# Patient Record
Sex: Female | Born: 1945
Health system: Southern US, Community
[De-identification: ages and names within clinical notes are randomized; demographics above are authoritative.]

## PROBLEM LIST (undated history)

## (undated) DIAGNOSIS — F32A Depression, unspecified: Secondary | ICD-10-CM

## (undated) DIAGNOSIS — K219 Gastro-esophageal reflux disease without esophagitis: Secondary | ICD-10-CM

## (undated) DIAGNOSIS — I10 Essential (primary) hypertension: Secondary | ICD-10-CM

## (undated) DIAGNOSIS — E785 Hyperlipidemia, unspecified: Secondary | ICD-10-CM

## (undated) HISTORY — PX: TONSILLECTOMY: SUR1361

## (undated) HISTORY — DX: Essential (primary) hypertension: I10

## (undated) HISTORY — DX: Hyperlipidemia, unspecified: E78.5

## (undated) HISTORY — DX: Depression, unspecified: F32.A

## (undated) HISTORY — DX: Gastro-esophageal reflux disease without esophagitis: K21.9

---

## 2000-07-16 ENCOUNTER — Inpatient Hospital Stay (HOSPITAL_COMMUNITY): Admission: EM | Admit: 2000-07-16 | Discharge: 2000-07-29 | Payer: Self-pay | Admitting: *Deleted

## 2000-07-23 ENCOUNTER — Encounter: Payer: Self-pay | Admitting: *Deleted

## 2000-07-30 ENCOUNTER — Other Ambulatory Visit (HOSPITAL_COMMUNITY): Admission: RE | Admit: 2000-07-30 | Discharge: 2000-08-07 | Payer: Self-pay | Admitting: Psychiatry

## 2003-09-11 ENCOUNTER — Other Ambulatory Visit: Admission: RE | Admit: 2003-09-11 | Discharge: 2003-09-11 | Payer: Self-pay | Admitting: Family Medicine

## 2003-09-15 ENCOUNTER — Ambulatory Visit (HOSPITAL_COMMUNITY): Admission: RE | Admit: 2003-09-15 | Discharge: 2003-09-15 | Payer: Self-pay | Admitting: Family Medicine

## 2004-10-01 ENCOUNTER — Encounter: Admission: RE | Admit: 2004-10-01 | Discharge: 2004-12-30 | Payer: Self-pay | Admitting: Family Medicine

## 2008-09-22 ENCOUNTER — Encounter: Admission: RE | Admit: 2008-09-22 | Discharge: 2008-09-22 | Payer: Self-pay | Admitting: Family Medicine

## 2010-07-19 IMAGING — CR DG CHEST 2V
2 series · 2 of 2 positions shown · non-contrast
Comparison: None

CLINICAL DATA: Cough.  Congestion.  Lethargy.

CHEST - 2 VIEW

[view not recorded (1 of 2)]
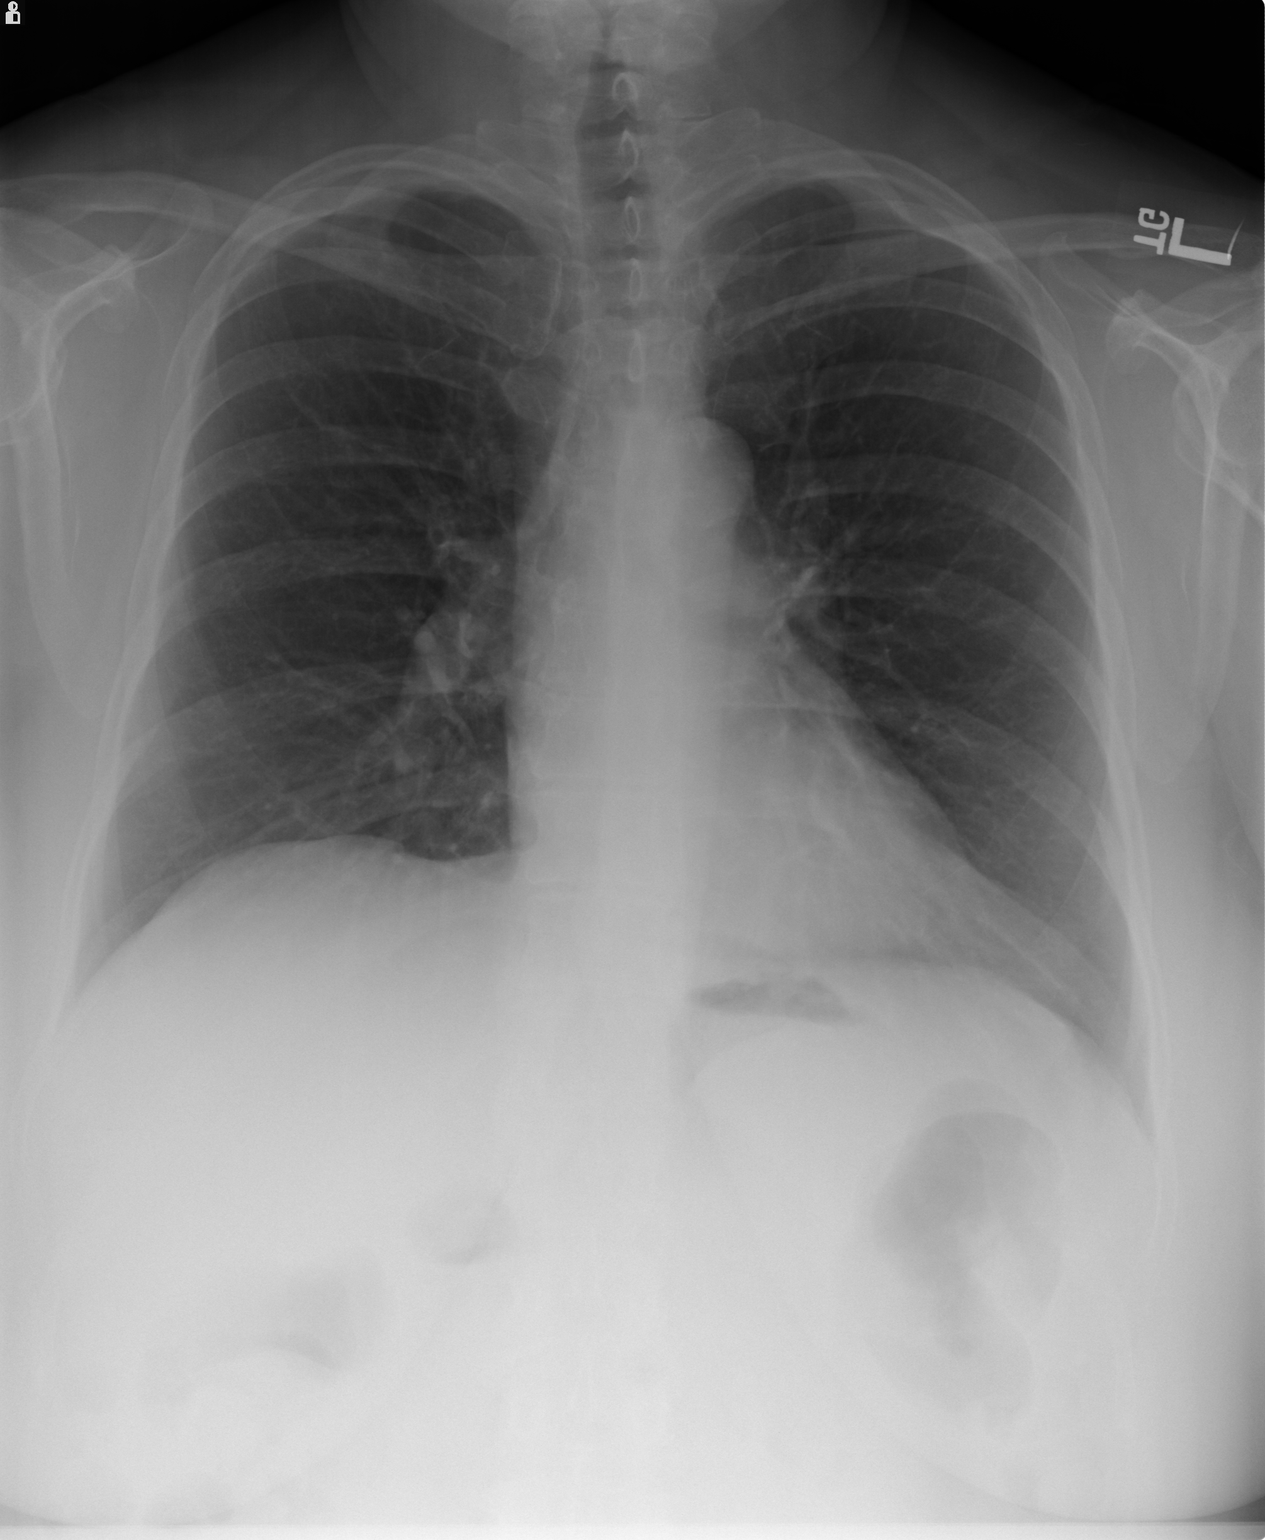

[view not recorded (2 of 2)]
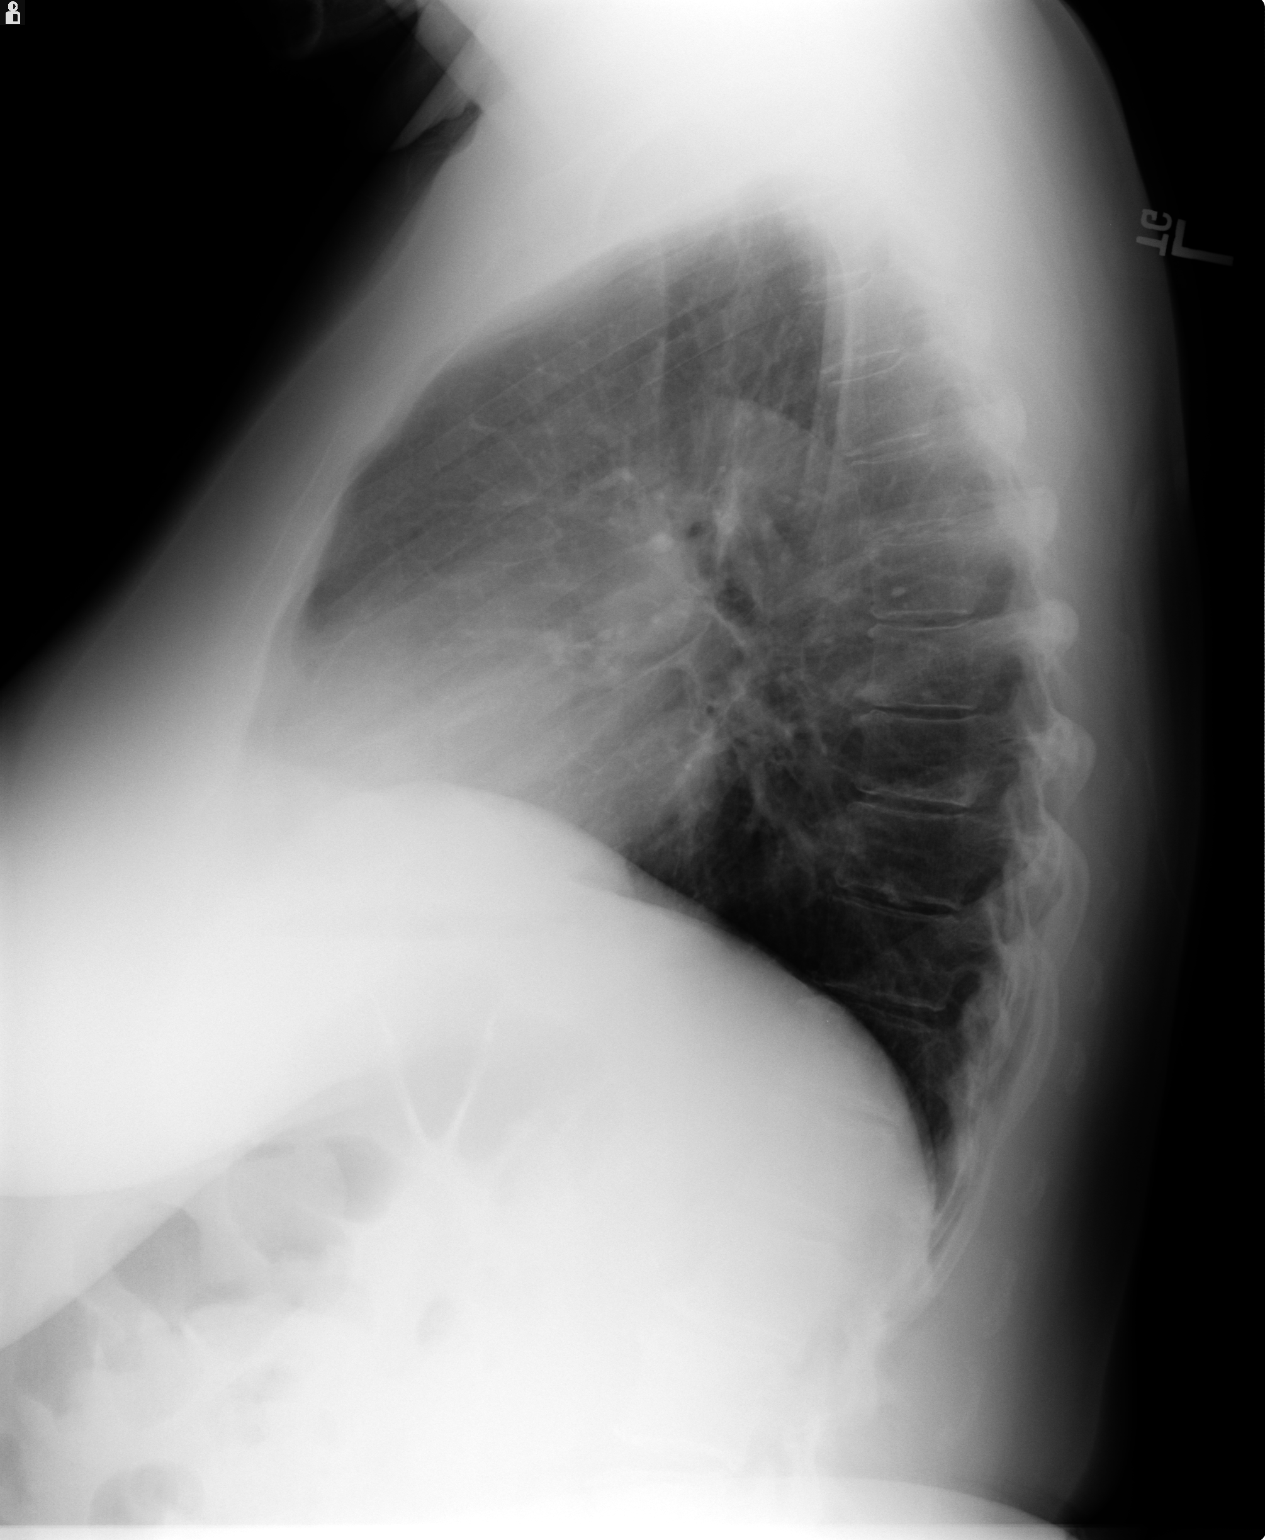

[2 of 2 positions shown; findings below may reference images not displayed]

FINDINGS: Heart size is normal.  The mediastinum is unremarkable.
Lungs are clear.  No definite bronchial thickening.  No infiltrate,
mass, effusion or collapse.  No significant bony finding.
IMPRESSION: No active disease

## 2010-08-09 NOTE — H&P (Signed)
[facility]  Patient:    Jamie Gamble, Jamie Gamble                            MRN: [id number] Adm. Date:  [id number] Attending:  Otilio Saber Dictator:   Young Berry Lorin Picket, R.N., F.N.P.                   Psychiatric Admission Assessment  DATE OF ADMISSION:  July 16, 2000  CHIEF COMPLAINT:  "Now I cant find a job and Im just concerned that Im going to a be a homeless mental patient."  PATIENT IDENTIFICATION:  This is a 65 year old single white female voluntary admission for a long history of depression since age 76 with increase in symptoms of depression since losing her job on April 10, 2000.  HISTORY OF PRESENT ILLNESS:  The patient states she was depressed prior to losing her job in January 2002; however she was able to function on her job. Then she lost her job at [employer] on January 18 due to budget cuts; however, she also states, "There was clique at work and they didnt like me." The patient describes the increasing symptoms of depression since losing he job with decreased sleep for the past three weeks becoming more severe, sleep fluctuating with generally less than six hours a night with initial insomnia and broken sleep.  Decreased appetite for the past month with no evidence of weight change.  Decreased concentration, positive anhedonia.  The patient feels very hopeless, very fearful of the future, and with feelings of great despair, "I cant go on; Im afraid I will be homeless."  The patient admits to feeling worthless and unable to continue.  She is positive for passive suicidal ideation with no plan and no intent.  She denies any auditory or visual hallucinations, denies any feelings of paranoia.  PAST PSYCHIATRIC HISTORY:  The patient was followed in [place] through January by her primary care physician then spent time in [place] with her brother where she was treated for two to three months by Dr. Jeannetta Nap who added Zyprexa to her  previous course of Celexa 40 mg q.d.  The patient then moved to [facility] and saw Dr. Shelly Flatten at [facility] on April 25 who referred her for hospitalization.  The patient reports she was first hospitalized at age 28 at [facility] with several hospitalizations in her 65s including electroconvulsive therapy during her 10s.  She has been previously treated with Prozac which caused anorexia and with Wellbutrin which had no effect.  She had at least one previous suicide attempt by overdose, this last in 1972.  SUBSTANCE ABUSE HISTORY:  The patient denies any abuse of alcohol, illegal drugs.  She is a nonsmoker.  PAST MEDICAL HISTORY:  The patient is followed by Dr. Minerva Areola, a primary care [facility] in [place].  She has had no other primary care since moving from [place].  Medical problems: Untreated hypertension; the patient previously tried some medication for her hypertension; however it made her depressed so she has taken none since.  Medications at the time of seeing Dr. Jodi Marble: Celexa 40 mg q.d., Xanax 0.5 mg p.r.n. dosage unclear; the patient reports she was taking this about two times daily; Zyprexa 10 mg q.h.s.  When seen by Dr. Jodi Marble, he suggesed increasing Celexa to 60 mg q.d., possibly adding Risperdal 0.5 mg q.h.s. and Tranxene 15 mg t.i.d. p.r.n. in his written  notes.  Drug allergies: No known drug allergies.  PE and labs are pending. Pulse 92 on admission, respirations 20, blood pressure 145/98, height 5 feet 5 inches, weight 181 pounds.  SOCIAL HISTORY:  The patient has a Ph.D. in adult education, worked at [employer] until April 10, 2000, when she was dismissed from her job due to [employer].  She previously worked as a Occupational hygienist.  She moved from [place] to [place] to briefly stay with her brother but was unsuccessful in job hunting there, then arrived in [place] on July 15, 2000, to stay with her  father and stepmother.  Seen by Dr. Jodi Marble on April 25 and immediately referred for hospitalization.  At this point, the patients housing plans are uncertain but she states that her family is supportive and said that they would not leave her out on the street or uncared for.  The patient is also terrified that she will not find a job and does not have a job currently. Family history: Mother who has been depressed and with schizophrenia.  Aunt with suicidal ideation and two uncles who committed suicide.  MENTAL STATUS EXAMINATION:  Overweight white female who is polite and cooperative with very flat affect and marked psychomotor retardation.  Speech is slowed in pace and monotone, no pressured speech evident.  Mood is depressed, sad, and anxious.  Thought process is logical, coherent, and goal directed.  She does not display any psychotic symptoms.  No auditory or visual hallucinations are evident, no paranoia evident.  Passive suicidal ideation but no intent or plan, no evidence of homicidal ideation.  She is able to promise safety on the unit.  Cognitive: Intact and oriented x 3.  ADMISSION DIAGNOSES: Axis I:    1. Major depression, recurrent, severe.            2. Generalized anxiety disorder. Axis II:   Deferred. Axis III:  Hypertension. Axis IV:   Moderate problems related to her occupation; she does not currently            have a job; and related to housing; she is currently without a            specific place of residence but does have her father and stepmother            her in this community who are supportive of her. Axis V:    Current 25, past year 56.  INITIAL PLAN OF CARE:  Admit the patient to stabilize her mood.  Q.42m. checks are in place for safety.  We will increase Celexa to 60 mg a day.  She slept well last night on her Restoril 30 mg at h.s. and Zyprexa 10 mg and we will continue those.  We will continue with her Xanax 0.5 mg p.o. b.i.d. as needed for her anxiety.   We will encourage her to participate in groups.  Goal is to lift her depression symptoms and increase her coping skills.  ESTIMATED LENGTH OF STAY:  Three to six days. y DD:  07/17/00 TD:  07/17/00  Job: [id number]

## 2010-08-09 NOTE — Discharge Summary (Signed)
[facility]  Patient:    Jamie Gamble, Jamie Gamble                            MRN: [id number] Adm. Date:  [id number] Disc. Date: 07/29/00 Attending:  Carolanne Gamble D Dictator:   Jamie Gamble, N.P.                           Discharge Summary  HISTORY OF PRESENT ILLNESS:  Jamie Gamble is a 65 year old single Caucasian female admitted on a voluntary basis for a long history of depression since the age of 46 with increasing symptoms of depression since losing her job on April 10, 2000.  She states, "Now I cant find a job and Im just concerned that Im going to be a homeless mental patient."  She had been depressed prior to losing her job in January 2002; however, she was able to function on her job.  She lost her job at [facility] in January due to budget cuts.  She states that people, however, at work did not like her.  Increasing symptoms of depression since losing the job with decreased sleep x 3 weeks, decreased appetite with no evidence of weight change, decreased concentration, anhedonia, feels hopeless, very fearful of the future, and feelings of great despair, "I cant go on; Im afraid I will be homeless."  Admits to feeling worthless and unable to continue, having passive suicidal ideation; however, no plan, no intent, no overt symptoms of psychosis.  PAST MEDICAL HISTORY:  Primary care physician is [facility] in [place].  Medical problems: Untreated hypertension.  Admission medications: Jamie [place] 40 mg q.d., [place] 0.5 mg p.r.n., [place] 10 mg at h.s., ______ .  Drug allergies: No known drug allergies.  She appeared to be in no acute distress. Temperature normal, pulse 92 on admission, respirations 20, blood pressure was elevated at 145/98, height 5 feet 5 inches, weight 101 pounds.  Review of systems was essentially negative.  Lab work: CBC was within normal limits. Thyroid profile: Within normal limits.  CMET: Within normal limits.  Urine drug screen  was positive for benzodiazepines.  Urinalysis: Small amount of esterase.  ADMITTING MENTAL STATUS EXAMINATION:  Overweight Caucasian female who is polite and cooperative with very flat affect and marked psychomotor retardation.  Speech: Slowed in pace and monotone, no pressured speech evident.  Mood is depressed, sad, and anxious.  Thought process: Logical, coherent, and goal directed.  Does not display any psychotic symptoms, no auditory or visual hallucinations, no paranoia, passive suicidal ideation with no intent or plan, no evidence of homicidal ideation, able to promise safety on the unit.  Oriented x 3, cognitive functioning appears to be intact.  ADMITTING DIAGNOSES: Axis I:    1. Major depression, recurrent, severe.            2. Generalized anxiety disorder. Axis II:   Deferred. Axis III:  Hypertension. Axis IV:   Moderate problems related to her occupation, has no job, no home at            present. Axis V:    Current global assessment of functioning 25, highest in the past            year has been 65.  HOSPITAL COURSE:  The patient was admitted to [facility] for treatment of her depression.  Throughout her stay she complained of feeling depressed she continued to  feel depressed and afraid, feeling she had no future, still having suicidal thoughts initially.  She was sleeping well, appetite was fair, energy low.  We did add Jamie Gamble, continued [place] and [place].  We discussed family history of suicide and depression.  At times she would report feeling a little better but she still remained very worried what was going to happen to her, sleeping well, appetite decreased, energy somewhat better, suicidal thoughts decreased.  Blood pressure was elevated; she was placed on [place] and continued Jamie [place]ble, Jamie [place], and [place].  She continued at times just to be anxious, worried, withdrawn.  She denied overt suicidal ideation, sleeping fair, energy was still low,  appetite fair, blood pressure was elevated.  Jamie Gamble was increased and we added Klonopin.  On May 1 she complained of feeling groggy but calmer and a little less depressed.  She denied any suicidal ideation.  Affect was still blunted.  We continued the same medication as well as discussed discharge plans.  She continued just to be depressed and pessimistic but she denied any suicidal ideation.  Affect was blunted.  She was sleeping and eating better.  Blood pressure was under control.  We increased [place] and continued to start working on placement.  On May 3, still depressed, hopeless.  She reported not sleeping well, appetite was poor, energy low, affect still blunted.  We talk to the patient about options including a mood stabilizer and ECT.  The patient did not want lithium or Lamictal.  We increased Jamie Gamble, watched her blood pressure, and looked into having ECT.  On May 4, she was still depressed and hopeless.  She was almost obsessively fixated on the inability to find a job and her future. Sleeping and eating were fair, some restlessness.  Jamie Gamble was increased. Again discussed ECT.  On May 5 she was still obsessing about her inability to work saying she had lost all her skills, crying, hopeless, sleeping better, appetite low, energy low, affect was blunted, tolerating Jamie Gamble.  Again we continued to look into the possibility of ECT.  On May 6 she was a little bit better, affect not as flat, not as hopeless about the future.  Sleep was better, energy a little bit better.  The patient wanted to hold off on ECT n and she denied any suicidal thoughts currently, continued increasing the [facility], continued to see her.  [name] that she maybe felt a little better on May 7, continued to focus on her job and feeling hopeless about finding one. Depression had improved some, no suicidal ideation, no homicidal ideation; does not want ECT, she does not feel she needs it.  Her parents  did find a place for her called [place] [facility] here in [place] and [place] had agreed to admit the patient the next day.  We continued her on [place], Jamie Gam[place],  Klonopin, [place], Jamie [place].  Mood was sad, affect depressed.  We continued to discuss discharge planning.  On May 8 she reported feeling better.  Affect was less blunted and she was not as hopeless or fixated on not finding work.  She denied any suicidal ideation, intent, or plan.  Sleeping and eating fairly well, energy a little better, tolerating [place] [facility]ble well.  We decided to discharge her to [facility] and her parents are very supportive.  The patient was agreeable to this.  CONDITION ON DISCHARGE:  The patient was discharged in improved condition with improvement in her mood, sleep and appetite.  She was eating and sleeping fair,  no suicidal ideation or intent, no homicidal ideation or intent, no signs of psychosis, energy had increased some.  She felt like she could be safe on an outpatient basis.  The patient was discharged to [facility].  DISCHARGE MEDICATIONS: 1. [place] 40 mg two tablets q.a.m. 2. [place] 150 mg SR one tablet at a.m. and at noon. 3. Klonopin 0.5 mg one tablet at a.m. and h.s. 4. [place] 15 mg one tablet at h.s. 5. [place] 50 mg one tablet q.a.m.  DIET:  A 2 g sodium diet due to her hypertension.  FOLLOWUP:  We also asked her to follow up on May 9 at 9 a.m. in the [facility] starting Thursday, May 9.  The patient was agreeable with this.  FINAL DIAGNOSES: Axis I:    1. Major depression, recurrent, severe.            2. Generalized anxiety disorder. Axis II:   Deferred. Axis III:  Hypertension. Axis IV:   Moderate problems related to not finding a job and currently having            no specific place of residence. Axis V:    Current global assessment of functioning 53, highest in the past            year has been 65. DD:  08/10/00 TD:  08/10/00 Job:  [id number]

## 2011-01-03 ENCOUNTER — Ambulatory Visit (INDEPENDENT_AMBULATORY_CARE_PROVIDER_SITE_OTHER): Payer: Self-pay | Admitting: General Surgery

## 2019-07-06 LAB — LIPID PANEL
Cholesterol: 174 (ref 0–200)
HDL: 70 (ref 35–70)
LDL Cholesterol: 78
LDl/HDL Ratio: 2.5
Triglycerides: 162 — AB (ref 40–160)

## 2019-07-06 LAB — BASIC METABOLIC PANEL
BUN: 15 (ref 4–21)
Creatinine: 0.9 (ref <=1.1)
Glucose: 101

## 2019-11-01 ENCOUNTER — Encounter: Payer: Self-pay | Admitting: Internal Medicine

## 2019-11-01 ENCOUNTER — Ambulatory Visit (INDEPENDENT_AMBULATORY_CARE_PROVIDER_SITE_OTHER): Payer: Medicare PPO | Admitting: Internal Medicine

## 2019-11-01 ENCOUNTER — Other Ambulatory Visit: Payer: Self-pay

## 2019-11-01 VITALS — BP 124/80 | HR 77 | Temp 98.1°F | Ht 63.5 in | Wt 186.8 lb

## 2019-11-01 DIAGNOSIS — F32A Depression, unspecified: Secondary | ICD-10-CM | POA: Insufficient documentation

## 2019-11-01 DIAGNOSIS — E785 Hyperlipidemia, unspecified: Secondary | ICD-10-CM

## 2019-11-01 DIAGNOSIS — Z1239 Encounter for other screening for malignant neoplasm of breast: Secondary | ICD-10-CM

## 2019-11-01 DIAGNOSIS — R739 Hyperglycemia, unspecified: Secondary | ICD-10-CM

## 2019-11-01 DIAGNOSIS — I10 Essential (primary) hypertension: Secondary | ICD-10-CM | POA: Diagnosis not present

## 2019-11-01 DIAGNOSIS — F332 Major depressive disorder, recurrent severe without psychotic features: Secondary | ICD-10-CM

## 2019-11-01 DIAGNOSIS — K219 Gastro-esophageal reflux disease without esophagitis: Secondary | ICD-10-CM

## 2019-11-01 LAB — POCT GLUCOSE (DEVICE FOR HOME USE): Glucose Fasting, POC: 97 mg/dL (ref 70–99)

## 2019-11-01 NOTE — Patient Instructions (Signed)
-  Nice seeing you today!!  -See you back in 6 months. 

## 2019-11-01 NOTE — Progress Notes (Signed)
Established Patient Office Visit     This visit occurred during the SARS-CoV-2 public health emergency.  Safety protocols were in place, including screening questions prior to the visit, additional usage of staff PPE, and extensive cleaning of exam room while observing appropriate contact time as indicated for disinfecting solutions.    CC/Reason for Visit: Establish care, discuss chronic medical conditions  HPI: Jamie Gamble is a 74 y.o. female who is coming in today for the above mentioned reasons. Past Medical History is significant for: Hypertension, hyperlipidemia, GERD, depression followed by psychiatry who has received ECT treatments recently.  She has moved from [place].  She was a smoker but quit over 20 years ago.  No alcohol use, she states she has an allergy to Nexium but that allergy was diarrhea, past surgical history significant for tonsillectomy, family history is significant for mental illness in mother and brother with suicide in her brother recently.  She feels she is doing well.  She tells me that her psychiatrist wants her to monitor her blood sugar every 6 months as one of her medications can cause hyperglycemia.  She is on Remeron, quetiapine and trazodone.  She has received both of her Covid vaccines.  She states she had a Cologuard in 2019, is overdue for screening mammogram.   Past Medical/Surgical History: Past Medical History:  Diagnosis Date  . Depression   . GERD (gastroesophageal reflux disease)   . Hyperlipidemia   . Hypertension     History reviewed. No pertinent surgical history.  Social History:  reports that she has quit smoking. She has never used smokeless tobacco. She reports previous alcohol use. She reports that she does not use drugs.  Allergies: Allergies  Allergen Reactions  . Nexium [Esomeprazole] Diarrhea    Family History:  Family History  Problem Relation Age of Onset  . Depression Mother   . Depression Brother      Current  Outpatient Medications:  .  atorvastatin (LIPITOR) 40 MG tablet, , Disp: , Rfl:  .  hydrochlorothiazide (MICROZIDE) 12.5 MG capsule, , Disp: , Rfl:  .  losartan (COZAAR) 25 MG tablet, , Disp: , Rfl:  .  mirtazapine (REMERON) 30 MG tablet, , Disp: , Rfl:  .  OVER THE COUNTER MEDICATION, Vitamin D3 125 mg, Disp: , Rfl:  .  pantoprazole (PROTONIX) 40 MG tablet, , Disp: , Rfl:  .  QUEtiapine (SEROQUEL) 50 MG tablet, , Disp: , Rfl:  .  traZODone (DESYREL) 150 MG tablet, , Disp: , Rfl:   Review of Systems:  Constitutional: Denies fever, chills, diaphoresis, appetite change and fatigue.  HEENT: Denies photophobia, eye pain, redness, hearing loss, ear pain, congestion, sore throat, rhinorrhea, sneezing, mouth sores, trouble swallowing, neck pain, neck stiffness and tinnitus.   Respiratory: Denies SOB, DOE, cough, chest tightness,  and wheezing.   Cardiovascular: Denies chest pain, palpitations and leg swelling.  Gastrointestinal: Denies nausea, vomiting, abdominal pain, diarrhea, constipation, blood in stool and abdominal distention.  Genitourinary: Denies dysuria, urgency, frequency, hematuria, flank pain and difficulty urinating.  Endocrine: Denies: hot or cold intolerance, sweats, changes in hair or nails, polyuria, polydipsia. Musculoskeletal: Denies myalgias, back pain, joint swelling, arthralgias and gait problem.  Skin: Denies pallor, rash and wound.  Neurological: Denies dizziness, seizures, syncope, weakness, light-headedness, numbness and headaches.  Hematological: Denies adenopathy. Easy bruising, personal or family bleeding history  Psychiatric/Behavioral: Denies suicidal ideation, mood changes, confusion, nervousness, sleep disturbance and agitation    Physical Exam: Vitals:   11/01/19  1105  BP: 124/80  Pulse: 77  Temp: 98.1 F (36.7 C)  TempSrc: Oral  SpO2: 96%  Weight: 186 lb 12.8 oz (84.7 kg)  Height: 5' 3.5" (1.613 m)    Body mass index is 32.57  kg/m.   Constitutional: NAD, calm, comfortable Eyes: PERRL, lids and conjunctivae normal ENMT: Mucous membranes are moist. Respiratory: clear to auscultation bilaterally, no wheezing, no crackles. Normal respiratory effort. No accessory muscle use.  Cardiovascular: Regular rate and rhythm, no murmurs / rubs / gallops. No extremity edema. Neurologic: Grossly intact and nonfocal Psychiatric: Normal judgment and insight. Alert and oriented x 3. Normal mood.    Impression and Plan:  Severe episode of recurrent major depressive disorder, without psychotic features (HCC) -Followed closely by psychiatry.  Essential hypertension -Well-controlled on current regimen.  Hyperlipidemia, unspecified hyperlipidemia type -She tells me she had a physical with blood work in April, she will obtain values for me. -For now continue atorvastatin 40 mg.  Gastroesophageal reflux disease without esophagitis -Well-controlled on occasional PPI therapy.    Patient Instructions  -Nice seeing you today!!  -See you back in 6 months.      [name], MD [facility]

## 2019-11-03 ENCOUNTER — Encounter: Payer: Self-pay | Admitting: Internal Medicine

## 2019-11-10 ENCOUNTER — Encounter: Payer: Self-pay | Admitting: Internal Medicine

## 2019-11-24 ENCOUNTER — Ambulatory Visit
Admission: RE | Admit: 2019-11-24 | Discharge: 2019-11-24 | Disposition: A | Discharge status: Home / Self Care | Payer: Medicare PPO | Source: Ambulatory Visit | Attending: Internal Medicine | Admitting: Internal Medicine

## 2019-11-24 ENCOUNTER — Other Ambulatory Visit: Payer: Self-pay

## 2019-11-24 DIAGNOSIS — Z1239 Encounter for other screening for malignant neoplasm of breast: Secondary | ICD-10-CM

## 2019-12-18 ENCOUNTER — Encounter: Payer: Self-pay | Admitting: Internal Medicine

## 2019-12-19 MED ORDER — LOSARTAN POTASSIUM 25 MG PO TABS: 25.0000 mg | ORAL_TABLET | Freq: Every day | ORAL | 0 refills | Status: DC

## 2019-12-19 MED ORDER — PANTOPRAZOLE SODIUM 40 MG PO TBEC: 40.0000 mg | DELAYED_RELEASE_TABLET | Freq: Every day | ORAL | 0 refills | Status: DC

## 2019-12-19 MED ORDER — ATORVASTATIN CALCIUM 40 MG PO TABS: 40.0000 mg | ORAL_TABLET | Freq: Every day | ORAL | 0 refills | Status: DC

## 2019-12-19 MED ORDER — HYDROCHLOROTHIAZIDE 12.5 MG PO CAPS: 12.5000 mg | ORAL_CAPSULE | Freq: Every day | ORAL | 0 refills | Status: DC

## 2019-12-23 ENCOUNTER — Encounter: Payer: Self-pay | Admitting: Internal Medicine

## 2019-12-23 ENCOUNTER — Telehealth: Payer: Self-pay | Admitting: Internal Medicine

## 2019-12-23 NOTE — Telephone Encounter (Signed)
FYI:  Pt called and wanted to know when her lab results are received from the other office.

## 2019-12-27 MED ORDER — LOSARTAN POTASSIUM 25 MG PO TABS
25.0000 mg | ORAL_TABLET | Freq: Every day | ORAL | 1 refills | Status: DC
Start: 2019-12-27 — End: 2020-05-09

## 2019-12-27 MED ORDER — HYDROCHLOROTHIAZIDE 12.5 MG PO CAPS
12.5000 mg | ORAL_CAPSULE | Freq: Every day | ORAL | 1 refills | Status: DC
Start: 2019-12-27 — End: 2020-05-10

## 2019-12-27 MED ORDER — PANTOPRAZOLE SODIUM 40 MG PO TBEC
40.0000 mg | DELAYED_RELEASE_TABLET | Freq: Every day | ORAL | 1 refills | Status: DC
Start: 2019-12-27 — End: 2020-10-18

## 2019-12-27 MED ORDER — ATORVASTATIN CALCIUM 40 MG PO TABS
40.0000 mg | ORAL_TABLET | Freq: Every day | ORAL | 1 refills | Status: DC
Start: 2019-12-27 — End: 2020-05-10

## 2019-12-27 NOTE — Addendum Note (Signed)
Addended by: Kern Reap B on: 12/27/2019 12:06 PM   Modules accepted: Orders

## 2019-12-27 NOTE — Telephone Encounter (Signed)
Form received,  Chart updated,  Refills sent, patient aware by MyChart.

## 2020-01-07 ENCOUNTER — Encounter: Payer: Self-pay | Admitting: Internal Medicine

## 2020-05-03 ENCOUNTER — Ambulatory Visit: Payer: Medicare PPO | Admitting: Internal Medicine

## 2020-05-09 ENCOUNTER — Encounter: Payer: Self-pay | Admitting: Internal Medicine

## 2020-05-10 MED ORDER — HYDROCHLOROTHIAZIDE 12.5 MG PO CAPS: 12.5000 mg | ORAL_CAPSULE | Freq: Every day | ORAL | 1 refills | Status: DC

## 2020-05-10 MED ORDER — LOSARTAN POTASSIUM 25 MG PO TABS
25.0000 mg | ORAL_TABLET | Freq: Every day | ORAL | 1 refills | Status: DC
Start: 2020-05-10 — End: 2020-08-13

## 2020-05-10 MED ORDER — ATORVASTATIN CALCIUM 40 MG PO TABS
40.0000 mg | ORAL_TABLET | Freq: Every day | ORAL | 1 refills | Status: DC
Start: 2020-05-10 — End: 2020-10-11

## 2020-05-14 ENCOUNTER — Encounter: Payer: Self-pay | Admitting: Internal Medicine

## 2020-05-17 ENCOUNTER — Ambulatory Visit: Payer: Medicare PPO | Admitting: Internal Medicine

## 2020-06-01 ENCOUNTER — Encounter: Payer: Self-pay | Admitting: Internal Medicine

## 2020-06-04 ENCOUNTER — Other Ambulatory Visit: Payer: Self-pay

## 2020-06-05 ENCOUNTER — Ambulatory Visit (INDEPENDENT_AMBULATORY_CARE_PROVIDER_SITE_OTHER): Payer: Medicare PPO | Admitting: Internal Medicine

## 2020-06-05 ENCOUNTER — Encounter: Payer: Self-pay | Admitting: Internal Medicine

## 2020-06-05 VITALS — BP 120/80 | HR 88 | Temp 98.3°F | Wt 172.4 lb

## 2020-06-05 DIAGNOSIS — I10 Essential (primary) hypertension: Secondary | ICD-10-CM

## 2020-06-05 DIAGNOSIS — F332 Major depressive disorder, recurrent severe without psychotic features: Secondary | ICD-10-CM | POA: Diagnosis not present

## 2020-06-05 DIAGNOSIS — E785 Hyperlipidemia, unspecified: Secondary | ICD-10-CM | POA: Diagnosis not present

## 2020-06-05 DIAGNOSIS — K219 Gastro-esophageal reflux disease without esophagitis: Secondary | ICD-10-CM | POA: Diagnosis not present

## 2020-06-05 LAB — CBC WITH DIFFERENTIAL/PLATELET
Basophils Absolute: 0.1 10*3/uL (ref 0.0–0.1)
Basophils Relative: 1.4 % (ref 0.0–3.0)
Eosinophils Absolute: 0.4 10*3/uL (ref 0.0–0.7)
Eosinophils Relative: 6.3 % — ABNORMAL HIGH (ref 0.0–5.0)
HCT: 36.3 % (ref 36.0–46.0)
Hemoglobin: 12.2 g/dL (ref 12.0–15.0)
Lymphocytes Relative: 15.9 % (ref 12.0–46.0)
Lymphs Abs: 0.9 10*3/uL (ref 0.7–4.0)
MCHC: 33.7 g/dL (ref 30.0–36.0)
MCV: 89.3 fl (ref 78.0–100.0)
Monocytes Absolute: 0.4 10*3/uL (ref 0.1–1.0)
Monocytes Relative: 6.4 % (ref 3.0–12.0)
Neutro Abs: 4.2 10*3/uL (ref 1.4–7.7)
Neutrophils Relative %: 70 % (ref 43.0–77.0)
Platelets: 312 10*3/uL (ref 150.0–400.0)
RBC: 4.07 Mil/uL (ref 3.87–5.11)
RDW: 15.9 % — ABNORMAL HIGH (ref 11.5–15.5)
WBC: 5.9 10*3/uL (ref 4.0–10.5)

## 2020-06-05 LAB — LIPID PANEL
Cholesterol: 166 mg/dL (ref 0–200)
HDL: 75.8 mg/dL (ref >39.00)
LDL Cholesterol: 75 mg/dL (ref 0–99)
NonHDL: 90.04
Total CHOL/HDL Ratio: 2
Triglycerides: 77 mg/dL (ref 0.0–149.0)
VLDL: 15.4 mg/dL (ref 0.0–40.0)

## 2020-06-05 LAB — VITAMIN D 25 HYDROXY (VIT D DEFICIENCY, FRACTURES): VITD: 43.97 ng/mL (ref 30.00–100.00)

## 2020-06-05 LAB — COMPREHENSIVE METABOLIC PANEL
ALT: 12 U/L (ref 0–35)
AST: 18 U/L (ref 0–37)
Albumin: 3.9 g/dL (ref 3.5–5.2)
Alkaline Phosphatase: 64 U/L (ref 39–117)
BUN: 16 mg/dL (ref 6–23)
CO2: 29 mEq/L (ref 19–32)
Calcium: 9.5 mg/dL (ref 8.4–10.5)
Chloride: 103 mEq/L (ref 96–112)
Creatinine, Ser: 0.76 mg/dL (ref 0.40–1.20)
GFR: 77.73 mL/min (ref >60.00)
Glucose, Bld: 89 mg/dL (ref 70–99)
Potassium: 4.5 mEq/L (ref 3.5–5.1)
Sodium: 140 mEq/L (ref 135–145)
Total Bilirubin: 0.4 mg/dL (ref 0.2–1.2)
Total Protein: 6.5 g/dL (ref 6.0–8.3)

## 2020-06-05 LAB — VITAMIN B12: Vitamin B-12: >1506 pg/mL — ABNORMAL HIGH (ref 211–911)

## 2020-06-05 LAB — TSH: TSH: 1.07 u[IU]/mL (ref 0.35–4.50)

## 2020-06-05 NOTE — Progress Notes (Signed)
Established Patient Office Visit     This visit occurred during the SARS-CoV-2 public health emergency.  Safety protocols were in place, including screening questions prior to the visit, additional usage of staff PPE, and extensive cleaning of exam room while observing appropriate contact time as indicated for disinfecting solutions.    CC/Reason for Visit: Follow-up chronic conditions, requesting labs  HPI: Jamie Gamble is a 75 y.o. female who is coming in today for the above mentioned reasons. Past Medical History is significant for: Hypertension and hyperlipidemia that have been well controlled, GERD and depression followed by psychiatry.  She is due for Pneumovax and Tdap which she will obtain at her pharmacy per her request.  She is fasting today and is requesting labs.  She has no acute complaints and feels well.   Past Medical/Surgical History: Past Medical History:  Diagnosis Date  . Depression   . GERD (gastroesophageal reflux disease)   . Hyperlipidemia   . Hypertension     No past surgical history on file.  Social History:  reports that she has quit smoking. She has never used smokeless tobacco. She reports previous alcohol use. She reports that she does not use drugs.  Allergies: Allergies  Allergen Reactions  . Nexium [Esomeprazole] Diarrhea    Family History:  Family History  Problem Relation Age of Onset  . Depression Mother   . Depression Brother      Current Outpatient Medications:  .  atorvastatin (LIPITOR) 40 MG tablet, Take 1 tablet (40 mg total) by mouth daily., Disp: 90 tablet, Rfl: 1 .  hydrochlorothiazide (MICROZIDE) 12.5 MG capsule, Take 1 capsule (12.5 mg total) by mouth daily., Disp: 90 capsule, Rfl: 1 .  losartan (COZAAR) 25 MG tablet, Take 1 tablet (25 mg total) by mouth daily., Disp: 90 tablet, Rfl: 1 .  OVER THE COUNTER MEDICATION, Vitamin D3 125 mg, Disp: , Rfl:  .  pantoprazole (PROTONIX) 40 MG tablet, Take 1 tablet (40 mg total) by  mouth daily., Disp: 90 tablet, Rfl: 1 .  QUEtiapine (SEROQUEL) 50 MG tablet, Take 25 mg by mouth daily., Disp: , Rfl:  .  traZODone (DESYREL) 150 MG tablet, Take 50 mg by mouth daily., Disp: , Rfl:   Review of Systems:  Constitutional: Denies fever, chills, diaphoresis, appetite change and fatigue.  HEENT: Denies photophobia, eye pain, redness, hearing loss, ear pain, congestion, sore throat, rhinorrhea, sneezing, mouth sores, trouble swallowing, neck pain, neck stiffness and tinnitus.   Respiratory: Denies SOB, DOE, cough, chest tightness,  and wheezing.   Cardiovascular: Denies chest pain, palpitations and leg swelling.  Gastrointestinal: Denies nausea, vomiting, abdominal pain, diarrhea, constipation, blood in stool and abdominal distention.  Genitourinary: Denies dysuria, urgency, frequency, hematuria, flank pain and difficulty urinating.  Endocrine: Denies: hot or cold intolerance, sweats, changes in hair or nails, polyuria, polydipsia. Musculoskeletal: Denies myalgias, back pain, joint swelling, arthralgias and gait problem.  Skin: Denies pallor, rash and wound.  Neurological: Denies dizziness, seizures, syncope, weakness, light-headedness, numbness and headaches.  Hematological: Denies adenopathy. Easy bruising, personal or family bleeding history  Psychiatric/Behavioral: Denies suicidal ideation, mood changes, confusion, nervousness, sleep disturbance and agitation    Physical Exam: Vitals:   06/05/20 1028  BP: 120/80  Pulse: 88  Temp: 98.3 F (36.8 C)  TempSrc: Oral  SpO2: 97%  Weight: 172 lb 6.4 oz (78.2 kg)    Body mass index is 30.06 kg/m.   Constitutional: NAD, calm, comfortable Eyes: PERRL, lids and conjunctivae normal ENMT: Mucous  membranes are moist.  Respiratory: clear to auscultation bilaterally, no wheezing, no crackles. Normal respiratory effort. No accessory muscle use.  Cardiovascular: Regular rate and rhythm, no murmurs / rubs / gallops. No extremity  edema.  Neurologic: Grossly intact and nonfocal Psychiatric: Normal judgment and insight. Alert and oriented x 3. Normal mood.    Impression and Plan:  Severe episode of recurrent major depressive disorder, without psychotic features (HCC)  - Plan: TSH, Vitamin B12, VITAMIN D 25 Hydroxy (Vit-D Deficiency, Fractures) -Mood is stable, followed by psychiatry.  Primary hypertension  - Plan: CBC with Differential/Platelet, Comprehensive metabolic panel -Blood pressure is well controlled on hydrochlorothiazide 12.5 mg daily and losartan 25 mg daily.  Hyperlipidemia, unspecified hyperlipidemia type  - Plan: Lipid panel, -Last LDL was 78 in April 2021.  She is on atorvastatin 40 mg daily.  Gastroesophageal reflux disease without esophagitis -Well-controlled on daily Protonix 40 mg.   Patient Instructions  -Nice seeing you today!!  -Lab work today; will notify you once results are available.  -Schedule follow up in 6 months.     [name], MD [facility]

## 2020-06-05 NOTE — Patient Instructions (Signed)
-  Nice seeing you today!!  -Lab work today; will notify you once results are available.  -Schedule follow up in 6 months. 

## 2020-06-06 ENCOUNTER — Encounter: Payer: Self-pay | Admitting: Internal Medicine

## 2020-06-14 ENCOUNTER — Telehealth: Payer: Self-pay | Admitting: *Deleted

## 2020-06-14 NOTE — Chronic Care Management (AMB) (Signed)
  Chronic Care Management   Note  06/14/2020 Name: Jamie Gamble MRN: [id number] DOB: June 28, 1945  Jamie Gamble is a 75 y.o. year old female who is a primary care patient of Isaac Bliss, Rayford Halsted, MD. I reached out to Wilburt Finlay by phone today in response to a referral sent by [name]'s PCP, Isaac Bliss, Rayford Halsted, MD     [name] was given information about Chronic Care Management services today including:  1. CCM service includes personalized support from designated clinical staff supervised by her physician, including individualized plan of care and coordination with other care providers 2. 24/7 contact phone numbers for assistance for urgent and routine care needs. 3. Service will only be billed when office clinical staff spend 20 minutes or more in a month to coordinate care. 4. Only one practitioner may furnish and bill the service in a calendar month. 5. The patient may stop CCM services at any time (effective at the end of the month) by phone call to the office staff. 6. The patient will be responsible for cost sharing (co-pay) of up to 20% of the service fee (after annual deductible is met).  Patient agreed to services and verbal consent obtained.   Follow up plan: Telephone appointment with care management team member scheduled for:06/18/2020 If patient returns call to provider office, please advise to call [place] at [phone].  [facility]

## 2020-06-18 ENCOUNTER — Ambulatory Visit (INDEPENDENT_AMBULATORY_CARE_PROVIDER_SITE_OTHER): Payer: Medicare PPO

## 2020-06-18 DIAGNOSIS — E785 Hyperlipidemia, unspecified: Secondary | ICD-10-CM | POA: Diagnosis not present

## 2020-06-18 DIAGNOSIS — I10 Essential (primary) hypertension: Secondary | ICD-10-CM

## 2020-06-18 NOTE — Patient Instructions (Signed)
Visit Information Thank you for speaking with me today.  Please be on the look out for the Advanced Directives forms I am sending you and contact me if you have any questions. PATIENT GOALS:  Goals Addressed            This Visit's Progress   . Lifestyle Change-Hypertension       Timeframe:  Long-Range Goal Priority:  Medium Start Date:     06/18/20                        Expected End Date:     09/21/20                  Follow Up Date 07/16/20   - check blood pressure weekly - choose a place to take my blood pressure (home, clinic or office, retail store) - write blood pressure results in a log or diary - learn about high blood pressure  -increase activity as tolerated   Why is this important?    The changes that you are asked to make may be hard to do.   This is especially true when the changes are life-long.   Knowing why it is important to you is the first step.   Working on the change with your family or support person helps you not feel alone.   Reward yourself and family or support person when goals are met. This can be an activity you choose like bowling, hiking, biking, swimming or shooting hoops.     Notes:     Marland Kitchen Manage My Cholesterol       Timeframe:  Long-Range Goal Priority:  Medium Start Date:   06/18/20                          Expected End Date:    09/21/20                   Follow Up Date 07/16/20    - change to whole grain breads, cereal, pasta - eat smaller or less servings of red meat - fill half the plate with nonstarchy vegetables - increase the amount of fiber in food - read food labels for fat and fiber - switch to low-fat or skim milk  -try to increase activity as tolerated Why is this important?   Changing cholesterol starts with eating heart-healthy foods.  Other steps may be to increase your activity and to quit if you smoke.    Notes:       High Cholesterol  High cholesterol is a condition in which the blood has high levels of a  white, waxy substance similar to fat (cholesterol). The liver makes all the cholesterol that the body needs. The human body needs small amounts of cholesterol to help build cells. A person gets extra or excess cholesterol from the food that he or she eats. The blood carries cholesterol from the liver to the rest of the body. If you have high cholesterol, deposits (plaques) may build up on the walls of your arteries. Arteries are the blood vessels that carry blood away from your heart. These plaques make the arteries narrow and stiff. Cholesterol plaques increase your risk for heart attack and stroke. Work with your health care provider to keep your cholesterol levels in a healthy range. What increases the risk? The following factors may make you more likely to develop this condition:  Eating foods that  are high in animal fat (saturated fat) or cholesterol.  Being overweight.  Not getting enough exercise.  A family history of high cholesterol (familial hypercholesterolemia).  Use of tobacco products.  Having diabetes. What are the signs or symptoms? There are no symptoms of this condition. How is this diagnosed? This condition may be diagnosed based on the results of a blood test.  If you are older than 74 years of age, your health care provider may check your cholesterol levels every 4-6 years.  You may be checked more often if you have high cholesterol or other risk factors for heart disease. The blood test for cholesterol measures:  "Bad" cholesterol, or LDL cholesterol. This is the main type of cholesterol that causes heart disease. The desired level is less than 100 mg/dL.  "Good" cholesterol, or HDL cholesterol. HDL helps protect against heart disease by cleaning the arteries and carrying the LDL to the liver for processing. The desired level for HDL is 60 mg/dL or higher.  Triglycerides. These are fats that your body can store or burn for energy. The desired level is less than  150 mg/dL.  Total cholesterol. This measures the total amount of cholesterol in your blood and includes LDL, HDL, and triglycerides. The desired level is less than 200 mg/dL. How is this treated? This condition may be treated with:  Diet changes. You may be asked to eat foods that have more fiber and less saturated fats or added sugar.  Lifestyle changes. These may include regular exercise, maintaining a healthy weight, and quitting use of tobacco products.  Medicines. These are given when diet and lifestyle changes have not worked. You may be prescribed a statin medicine to help lower your cholesterol levels. Follow these instructions at home: Eating and drinking  Eat a healthy, balanced diet. This diet includes: ? Daily servings of a variety of fresh, frozen, or canned fruits and vegetables. ? Daily servings of whole grain foods that are rich in fiber. ? Foods that are low in saturated fats and trans fats. These include poultry and fish without skin, lean cuts of meat, and low-fat dairy products. ? A variety of fish, especially oily fish that contain omega-3 fatty acids. Aim to eat fish at least 2 times a week.  Avoid foods and drinks that have added sugar.  Use healthy cooking methods, such as roasting, grilling, broiling, baking, poaching, steaming, and stir-frying. Do not fry your food except for stir-frying.   Lifestyle  Get regular exercise. Aim to exercise for a total of 150 minutes a week. Increase your activity level by doing activities such as gardening, walking, and taking the stairs.  Do not use any products that contain nicotine or tobacco, such as cigarettes, e-cigarettes, and chewing tobacco. If you need help quitting, ask your health care provider.   General instructions  Take over-the-counter and prescription medicines only as told by your health care provider.  Keep all follow-up visits as told by your health care provider. This is important. Where to find more  information  American Heart Association: www.heart.org  National Heart, Lung, and Blood Institute: https://wilson-eaton.com/ Contact a health care provider if:  You have trouble achieving or maintaining a healthy diet or weight.  You are starting an exercise program.  You are unable to stop smoking. Get help right away if:  You have chest pain.  You have trouble breathing.  You have any symptoms of a stroke. "BE FAST" is an easy way to remember the main warning signs  of a stroke: ? B - Balance. Signs are dizziness, sudden trouble walking, or loss of balance. ? E - Eyes. Signs are trouble seeing or a sudden change in vision. ? F - Face. Signs are sudden weakness or numbness of the face, or the face or eyelid drooping on one side. ? A - Arms. Signs are weakness or numbness in an arm. This happens suddenly and usually on one side of the body. ? S - Speech. Signs are sudden trouble speaking, slurred speech, or trouble understanding what people say. ? T - Time. Time to call emergency services. Write down what time symptoms started.  You have other signs of a stroke, such as: ? A sudden, severe headache with no known cause. ? Nausea or vomiting. ? Seizure. These symptoms may represent a serious problem that is an emergency. Do not wait to see if the symptoms will go away. Get medical help right away. Call your local emergency services (911 in the [place] Do not drive yourself to the hospital. Summary  Cholesterol plaques increase your risk for heart attack and stroke. Work with your health care provider to keep your cholesterol levels in a healthy range.  Eat a healthy, balanced diet, get regular exercise, and maintain a healthy weight.  Do not use any products that contain nicotine or tobacco, such as cigarettes, e-cigarettes, and chewing tobacco.  Get help right away if you have any symptoms of a stroke. This information is not intended to replace advice given to you by your health care  provider. Make sure you discuss any questions you have with your health care provider. Document Revised: 02/07/2019 Document Reviewed: 02/07/2019 Elsevier Patient Education  2021 [place].  Hypertension, Adult Hypertension is another name for high blood pressure. High blood pressure forces your heart to work harder to pump blood. This can cause problems over time. There are two numbers in a blood pressure reading. There is a top number (systolic) over a bottom number (diastolic). It is best to have a blood pressure that is below 120/80. Healthy choices can help lower your blood pressure, or you may need medicine to help lower it. What are the causes? The cause of this condition is not known. Some conditions may be related to high blood pressure. What increases the risk?  Smoking.  Having type 2 diabetes mellitus, high cholesterol, or both.  Not getting enough exercise or physical activity.  Being overweight.  Having too much fat, sugar, calories, or salt (sodium) in your diet.  Drinking too much alcohol.  Having long-term (chronic) kidney disease.  Having a family history of high blood pressure.  Age. Risk increases with age.  Race. You may be at higher risk if you are African American.  Gender. Men are at higher risk than women before age 21. After age 5, women are at higher risk than men.  Having obstructive sleep apnea.  Stress. What are the signs or symptoms?  High blood pressure may not cause symptoms. Very high blood pressure (hypertensive crisis) may cause: ? Headache. ? Feelings of worry or nervousness (anxiety). ? Shortness of breath. ? Nosebleed. ? A feeling of being sick to your stomach (nausea). ? Throwing up (vomiting). ? Changes in how you see. ? Very bad chest pain. ? Seizures. How is this treated?  This condition is treated by making healthy lifestyle changes, such as: ? Eating healthy foods. ? Exercising more. ? Drinking less  alcohol.  Your health care provider may prescribe medicine if lifestyle  changes are not enough to get your blood pressure under control, and if: ? Your top number is above 130. ? Your bottom number is above 80.  Your personal target blood pressure may vary. Follow these instructions at home: Eating and drinking  If told, follow the DASH eating plan. To follow this plan: ? Fill one half of your plate at each meal with fruits and vegetables. ? Fill one fourth of your plate at each meal with whole grains. Whole grains include whole-wheat pasta, brown rice, and whole-grain bread. ? Eat or drink low-fat dairy products, such as skim milk or low-fat yogurt. ? Fill one fourth of your plate at each meal with low-fat (lean) proteins. Low-fat proteins include fish, chicken without skin, eggs, beans, and tofu. ? Avoid fatty meat, cured and processed meat, or chicken with skin. ? Avoid pre-made or processed food.  Eat less than 1,500 mg of salt each day.  Do not drink alcohol if: ? Your doctor tells you not to drink. ? You are pregnant, may be pregnant, or are planning to become pregnant.  If you drink alcohol: ? Limit how much you use to:  0-1 drink a day for women.  0-2 drinks a day for men. ? Be aware of how much alcohol is in your drink. In the [place] one drink equals one 12 oz bottle of beer (355 mL), one 5 oz glass of wine (148 mL), or one 1 oz glass of hard liquor (44 mL).   Lifestyle  Work with your doctor to stay at a healthy weight or to lose weight. Ask your doctor what the best weight is for you.  Get at least 30 minutes of exercise most days of the week. This may include walking, swimming, or biking.  Get at least 30 minutes of exercise that strengthens your muscles (resistance exercise) at least 3 days a week. This may include lifting weights or doing Pilates.  Do not use any products that contain nicotine or tobacco, such as cigarettes, e-cigarettes, and chewing tobacco. If  you need help quitting, ask your doctor.  Check your blood pressure at home as told by your doctor.  Keep all follow-up visits as told by your doctor. This is important.   Medicines  Take over-the-counter and prescription medicines only as told by your doctor. Follow directions carefully.  Do not skip doses of blood pressure medicine. The medicine does not work as well if you skip doses. Skipping doses also puts you at risk for problems.  Ask your doctor about side effects or reactions to medicines that you should watch for. Contact a doctor if you:  Think you are having a reaction to the medicine you are taking.  Have headaches that keep coming back (recurring).  Feel dizzy.  Have swelling in your ankles.  Have trouble with your vision. Get help right away if you:  Get a very bad headache.  Start to feel mixed up (confused).  Feel weak or numb.  Feel faint.  Have very bad pain in your: ? Chest. ? Belly (abdomen).  Throw up more than once.  Have trouble breathing. Summary  Hypertension is another name for high blood pressure.  High blood pressure forces your heart to work harder to pump blood.  For most people, a normal blood pressure is less than 120/80.  Making healthy choices can help lower blood pressure. If your blood pressure does not get lower with healthy choices, you may need to take medicine. This information is  not intended to replace advice given to you by your health care provider. Make sure you discuss any questions you have with your health care provider. Document Revised: 11/18/2017 Document Reviewed: 11/18/2017 Elsevier Patient Education  2021 [place].   Consent to CCM Services: Jamie Gamble was given information about Chronic Care Management services today including:  1. CCM service includes personalized support from designated clinical staff supervised by her physician, including individualized plan of care and coordination with other care  providers 2. 24/7 contact phone numbers for assistance for urgent and routine care needs. 3. Service will only be billed when office clinical staff spend 20 minutes or more in a month to coordinate care. 4. Only one practitioner may furnish and bill the service in a calendar month. 5. The patient may stop CCM services at any time (effective at the end of the month) by phone call to the office staff. 6. The patient will be responsible for cost sharing (co-pay) of up to 20% of the service fee (after annual deductible is met).  Patient agreed to services and verbal consent obtained.   Patient verbalizes understanding of instructions provided today and agrees to view in [place].   Telephone follow up appointment with care management team member scheduled for: 07/16/20 10 AM  Peter Garter RN, [facility], CDE Care Management Coordinator [facility] [phone], Mobile [phone]  CLINICAL CARE PLAN: Patient Care Plan: RNCM:Hypertension (Adult)    Problem Identified: Lack of long term self-management of Hypertension   Priority: Medium    Long-Range Goal: Long term effective self-management of hypertension   Start Date: 06/18/2020  Expected End Date: 09/21/2020  This Visit's Progress: On track  Priority: Medium  Note:   Objective:  . Last practice recorded BP readings:  BP Readings from Last 3 Encounters:  06/05/20 120/80  11/01/19 124/80 .   Marland Kitchen Most recent eGFR/CrCl: No results found for: EGFR  No components found for: CRCL Current Barriers:  Marland Kitchen Knowledge Deficits related to basic understanding of hypertension pathophysiology and self care management . Knowledge Deficits related to understanding of medications prescribed for management of hypertension . Does not adhere to provider recommendations re:  Marland Kitchen Reports she does not regularly check her B/P unless she has a headache or is dizzy, reports eating baked chips at times and has not been watching sodium in  diet Case Manager Clinical Goal(s):  . patient will verbalize understanding of plan for hypertension management . patient will attend all scheduled medical appointments: Dr. Jerilee Hoh 09/05/20 . patient will demonstrate improved health management independence as evidenced by checking blood pressure as directed and notifying PCP if SBP>140 or DBP > 90, taking all medications as prescribe, and adhering to a low sodium diet as discussed. Interventions:  . Collaboration with Isaac Bliss, Rayford Halsted, MD regarding development and update of comprehensive plan of care as evidenced by provider attestation and co-signature . Inter-disciplinary care team collaboration (see longitudinal plan of care) . Evaluation of current treatment plan related to hypertension self management and patient's adherence to plan as established by provider. . Provided education to patient re: stroke prevention, s/s of heart attack and stroke, DASH diet, complications of uncontrolled blood pressure . Discussed plans with patient for ongoing care management follow up and provided patient with direct contact information for care management team . Advised patient, providing education and rationale, to monitor blood pressure 1-2 times a week and record, calling PCP for findings outside established parameters.  . Reviewed scheduled/upcoming provider appointments including: Dr.Hernandez  09/05/20 Self-Care Activities: - Self administers medications as prescribed Attends all scheduled provider appointments Calls provider office for new concerns, questions, or BP outside discussed parameters Checks BP and records as discussed Follows a low sodium diet/DASH diet Patient Goals: - check blood pressure weekly - choose a place to take my blood pressure (home, clinic or office, retail store) - write blood pressure results in a log or diary - learn about high blood pressure -increase activity as tolerated Follow Up Plan: Telephone follow  up appointment with care management team member scheduled for: 07/16/20 at 10 AM      Patient Care Plan: RNCM: Hyperlipidemia    Problem Identified: Lack of effective self management of hyperlipidemia   Priority: Medium    Long-Range Goal: Effective self management of hyperlipidemia   Start Date: 06/18/2020  Expected End Date: 09/21/2020  This Visit's Progress: On track  Priority: Medium  Note:   Current Barriers:  Marland Kitchen Varied  controlled hyperlipidemia, complicated by hx of HTN and depression . Current antihyperlipidemic regimen: Atorvastatin  . Most recent lipid panel:     Component Value Date/Time   CHOL 166 06/05/2020 1050   TRIG 77.0 06/05/2020 1050   HDL 75.80 06/05/2020 1050   CHOLHDL 2 06/05/2020 1050   VLDL 15.4 06/05/2020 1050   LDLCALC 75 06/05/2020 1050 .   Marland Kitchen ASCVD risk enhancing conditions: age >23,  HTN . Does not adhere to provider recommendations re:  RN Care Manager Clinical Goal(s):  . patient will work with Consulting civil engineer, providers, and care team towards execution of optimized self-health management plan . patient will verbalize understanding of plan for hyperlipidemia . patient will meet with RN Care Manager to address educational needs for self management of hyperlipidemia . patient will attend all scheduled medical appointments: Dr. Jerilee Hoh 09/05/20 . patient will verbalize basic understanding of hyperlipidemia disease process and self health management plan as evidenced by adherence to medications,diet, exercise and follow up with provider . the patient will work with the care management team towards completion of advanced directives Interventions: . Collaboration with Isaac Bliss, Rayford Halsted, MD regarding development and update of comprehensive plan of care as evidenced by provider attestation and co-signature . Inter-disciplinary care team collaboration (see longitudinal plan of care) . Medication review performed; medication list updated in electronic  medical record.  . Evaluation of current treatment plan related to hyperlipidemia and patient's adherence to plan as established by provider. . Provided education to patient and/or caregiver about advanced directives . Provided education to patient re: hyperlipidemia  . Reviewed medications with patient and discussed statin use . Provided patient with written educational materials related to hyperlipidemia . Reviewed scheduled/upcoming provider appointments including:  . Discussed plans with patient for ongoing care management follow up and provided patient with direct contact information for care management team . Instructed on importance of regular exercise for lipid management . Mailed patient Advanced Directives  packet Patient Goals/Self-Care Activities: - call for medicine refill 2 or 3 days before it runs out - change to whole grain breads, cereal, pasta - drink 6 to 8 glasses of water each day - eat 3 to 5 servings of fruits and vegetables each day - fill half the plate with nonstarchy vegetables - read food labels for fat, fiber, carbohydrates and portion size - eat smaller or less servings of red meat - fill half the plate with nonstarchy vegetables - increase the amount of fiber in food - read food labels for fat and fiber -  switch to low-fat or skim milk -increase activity as tolerated Follow Up Plan: Telephone follow up appointment with care management team member scheduled for: 07/16/20 at 10 AM

## 2020-06-18 NOTE — Chronic Care Management (AMB) (Signed)
Chronic Care Management   CCM RN Visit Note  06/18/2020 Name: Jamie Gamble MRN: [id number] DOB: 1945/11/02  Subjective: Jamie Gamble is a 75 y.o. year old female who is a primary care patient of Isaac Bliss, Rayford Halsted, MD. The care management team was consulted for assistance with disease management and care coordination needs.    Engaged with patient by telephone for initial visit in response to provider referral for case management and/or care coordination services.   Consent to Services:  The patient was given the following information about Chronic Care Management services today, agreed to services, and gave verbal consent: 1. CCM service includes personalized support from designated clinical staff supervised by the primary care provider, including individualized plan of care and coordination with other care providers 2. 24/7 contact phone numbers for assistance for urgent and routine care needs. 3. Service will only be billed when office clinical staff spend 20 minutes or more in a month to coordinate care. 4. Only one practitioner may furnish and bill the service in a calendar month. 5.The patient may stop CCM services at any time (effective at the end of the month) by phone call to the office staff. 6. The patient will be responsible for cost sharing (co-pay) of up to 20% of the service fee (after annual deductible is met). Patient agreed to services and consent obtained.  Patient agreed to services and verbal consent obtained.   Assessment: Review of patient past medical history, allergies, medications, health status, including review of consultants reports, laboratory and other test data, was performed as part of comprehensive evaluation and provision of chronic care management services.   SDOH (Social Determinants of Health) assessments and interventions performed:  SDOH Interventions   Flowsheet Row Most Recent Value  SDOH Interventions   Food Insecurity Interventions Intervention Not  Indicated  Housing Interventions Intervention Not Indicated  Physical Activity Interventions Other (Comments)  [reviewed importance of regular exercise]  Transportation Interventions Intervention Not Indicated  [uses [employer] or friends]       CCM Care Plan  Allergies  Allergen Reactions  . Nexium [Esomeprazole] Diarrhea    Outpatient Encounter Medications as of 06/18/2020  Medication Sig  . atorvastatin (LIPITOR) 40 MG tablet Take 1 tablet (40 mg total) by mouth daily.  . hydrochlorothiazide (MICROZIDE) 12.5 MG capsule Take 1 capsule (12.5 mg total) by mouth daily.  Marland Kitchen losartan (COZAAR) 25 MG tablet Take 1 tablet (25 mg total) by mouth daily.  Marland Kitchen OVER THE COUNTER MEDICATION Vitamin D3 125 mg  . QUEtiapine (SEROQUEL) 50 MG tablet Take 25 mg by mouth daily.  . traZODone (DESYREL) 150 MG tablet Take 50 mg by mouth daily.  . pantoprazole (PROTONIX) 40 MG tablet Take 1 tablet (40 mg total) by mouth daily. (Patient not taking: Reported on 06/18/2020)   No facility-administered encounter medications on file as of 06/18/2020.    Patient Active Problem List   Diagnosis Date Noted  . Depression   . Hypertension   . Hyperlipidemia   . GERD (gastroesophageal reflux disease)     Conditions to be addressed/monitored:HTN, HLD and Depression  Care Plan : RNCM:Hypertension (Adult)  Updates made by Dimitri Ped, RN since 06/18/2020 12:00 AM    Problem: Lack of long term self-management of Hypertension   Priority: Medium    Long-Range Goal: Long term effective self-management of hypertension   Start Date: 06/18/2020  Expected End Date: 09/21/2020  This Visit's Progress: On track  Priority: Medium  Note:   Objective:  .  Last practice recorded BP readings:  BP Readings from Last 3 Encounters:  06/05/20 120/80  11/01/19 124/80 .   Marland Kitchen Most recent eGFR/CrCl: No results found for: EGFR  No components found for: CRCL Current Barriers:  Marland Kitchen Knowledge Deficits related to basic understanding of  hypertension pathophysiology and self care management . Knowledge Deficits related to understanding of medications prescribed for management of hypertension . Does not adhere to provider recommendations re:  Marland Kitchen Reports she does not regularly check her B/P unless she has a headache or is dizzy, reports eating baked chips at times and has not been watching sodium in diet Case Manager Clinical Goal(s):  . patient will verbalize understanding of plan for hypertension management . patient will attend all scheduled medical appointments: Dr. Jerilee Hoh 09/05/20 . patient will demonstrate improved health management independence as evidenced by checking blood pressure as directed and notifying PCP if SBP>140 or DBP > 90, taking all medications as prescribe, and adhering to a low sodium diet as discussed. Interventions:  . Collaboration with Isaac Bliss, Rayford Halsted, MD regarding development and update of comprehensive plan of care as evidenced by provider attestation and co-signature . Inter-disciplinary care team collaboration (see longitudinal plan of care) . Evaluation of current treatment plan related to hypertension self management and patient's adherence to plan as established by provider. . Provided education to patient re: stroke prevention, s/s of heart attack and stroke, DASH diet, complications of uncontrolled blood pressure . Discussed plans with patient for ongoing care management follow up and provided patient with direct contact information for care management team . Advised patient, providing education and rationale, to monitor blood pressure 1-2 times a week and record, calling PCP for findings outside established parameters.  . Reviewed scheduled/upcoming provider appointments including: Dr.Hernandez 09/05/20 Self-Care Activities: - Self administers medications as prescribed Attends all scheduled provider appointments Calls provider office for new concerns, questions, or BP outside  discussed parameters Checks BP and records as discussed Follows a low sodium diet/DASH diet Patient Goals: - check blood pressure weekly - choose a place to take my blood pressure (home, clinic or office, retail store) - write blood pressure results in a log or diary - learn about high blood pressure -increase activity as tolerated Follow Up Plan: Telephone follow up appointment with care management team member scheduled for: 07/16/20 at 10 AM   Care Plan : RNCM: Hyperlipidemia  Updates made by Dimitri Ped, RN since 06/18/2020 12:00 AM    Problem: Lack of effective self management of hyperlipidemia   Priority: Medium    Long-Range Goal: Effective self management of hyperlipidemia   Start Date: 06/18/2020  Expected End Date: 09/21/2020  This Visit's Progress: On track  Priority: Medium  Note:   Current Barriers:  Marland Kitchen Varied  controlled hyperlipidemia, complicated by hx of HTN and depression . Current antihyperlipidemic regimen: Atorvastatin  . Most recent lipid panel:     Component Value Date/Time   CHOL 166 06/05/2020 1050   TRIG 77.0 06/05/2020 1050   HDL 75.80 06/05/2020 1050   CHOLHDL 2 06/05/2020 1050   VLDL 15.4 06/05/2020 1050   LDLCALC 75 06/05/2020 1050 .   Marland Kitchen ASCVD risk enhancing conditions: age >63,  HTN . Does not adhere to provider recommendations re:  RN Care Manager Clinical Goal(s):  . patient will work with Consulting civil engineer, providers, and care team towards execution of optimized self-health management plan . patient will verbalize understanding of plan for hyperlipidemia . patient will meet with RN Care  Manager to address educational needs for self management of hyperlipidemia . patient will attend all scheduled medical appointments: Dr. Jerilee Hoh 09/05/20 . patient will verbalize basic understanding of hyperlipidemia disease process and self health management plan as evidenced by adherence to medications,diet, exercise and follow up with provider . the  patient will work with the care management team towards completion of advanced directives Interventions: . Collaboration with Isaac Bliss, Rayford Halsted, MD regarding development and update of comprehensive plan of care as evidenced by provider attestation and co-signature . Inter-disciplinary care team collaboration (see longitudinal plan of care) . Medication review performed; medication list updated in electronic medical record.  . Evaluation of current treatment plan related to hyperlipidemia and patient's adherence to plan as established by provider. . Provided education to patient and/or caregiver about advanced directives . Provided education to patient re: hyperlipidemia  . Reviewed medications with patient and discussed statin use . Provided patient with written educational materials related to hyperlipidemia . Reviewed scheduled/upcoming provider appointments including:  . Discussed plans with patient for ongoing care management follow up and provided patient with direct contact information for care management team . Instructed on importance of regular exercise for lipid management . Mailed patient Advanced Directives  packet Patient Goals/Self-Care Activities: - call for medicine refill 2 or 3 days before it runs out - change to whole grain breads, cereal, pasta - drink 6 to 8 glasses of water each day - eat 3 to 5 servings of fruits and vegetables each day - fill half the plate with nonstarchy vegetables - read food labels for fat, fiber, carbohydrates and portion size - eat smaller or less servings of red meat - fill half the plate with nonstarchy vegetables - increase the amount of fiber in food - read food labels for fat and fiber - switch to low-fat or skim milk -increase activity as tolerated Follow Up Plan: Telephone follow up appointment with care management team member scheduled for: 07/16/20 at 10 AM       Plan:Telephone follow up appointment with care management  team member scheduled for:  07/16/20 Peter Garter RN, Jackquline Denmark, CDE Care Management Coordinator [place] [phone], [place] [phone]

## 2020-06-20 ENCOUNTER — Encounter: Payer: Self-pay | Admitting: Internal Medicine

## 2020-06-22 ENCOUNTER — Encounter: Payer: Self-pay | Admitting: Internal Medicine

## 2020-07-16 ENCOUNTER — Ambulatory Visit (INDEPENDENT_AMBULATORY_CARE_PROVIDER_SITE_OTHER): Payer: Medicare PPO

## 2020-07-16 DIAGNOSIS — E785 Hyperlipidemia, unspecified: Secondary | ICD-10-CM | POA: Diagnosis not present

## 2020-07-16 DIAGNOSIS — I10 Essential (primary) hypertension: Secondary | ICD-10-CM | POA: Diagnosis not present

## 2020-07-16 NOTE — Patient Instructions (Signed)
Visit Information  PATIENT GOALS: Goals Addressed            This Visit's Progress   . Lifestyle Change-Hypertension   On track    Timeframe:  Long-Range Goal Priority:  Medium Start Date:     06/18/20                        Expected End Date:     09/21/20                  Follow Up Date 09/10/20   - check blood pressure weekly - choose a place to take my blood pressure (home, clinic or office, retail store) - write blood pressure results in a log or diary - learn about high blood pressure  -increase activity as tolerated   Why is this important?    The changes that you are asked to make may be hard to do.   This is especially true when the changes are life-long.   Knowing why it is important to you is the first step.   Working on the change with your family or support person helps you not feel alone.   Reward yourself and family or support person when goals are met. This can be an activity you choose like bowling, hiking, biking, swimming or shooting hoops.     Notes:     Marland Kitchen Manage My Cholesterol   On track    Timeframe:  Long-Range Goal Priority:  Medium Start Date:   06/18/20                          Expected End Date:    09/21/20                   Follow Up Date 09/10/20    - change to whole grain breads, cereal, pasta - eat smaller or less servings of red meat - fill half the plate with nonstarchy vegetables - increase the amount of fiber in food - read food labels for fat and fiber - switch to low-fat or skim milk  -try to increase activity as tolerated Why is this important?   Changing cholesterol starts with eating heart-healthy foods.  Other steps may be to increase your activity and to quit if you smoke.    Notes:       Exercise Information for Aging Adults Staying physically active is important as you age. The four types of exercises that are best for older adults are endurance, strength, balance, and flexibility. Contact your health care provider  before you start any exercise routine. Ask your health care provider what activities are safe for you. What are the risks? Risks associated with exercising include:  Overdoing it. This may lead to sore muscles or fatigue.  Falls.  Injuries.  Dehydration. How to do these exercises Endurance exercises Endurance (aerobic) exercises raise your breathing rate and heart rate. Increasing your endurance helps you to do everyday tasks and stay healthy. By improving the health of your body system that includes your heart, lungs, and blood vessels (circulatory system), you may also delay or prevent diseases such as heart disease, diabetes, and bone loss (osteoporosis). Types of endurance exercises include:  Sports.  Indoor activities, such as using gym equipment, doing water aerobics, or dancing.  Outdoor activities, such as biking or jogging.  Tasks around the house, such as gardening, yard work, and heavy household chores like  cleaning.  Walking, such as hiking or walking around your neighborhood. When doing endurance exercises, make sure you:  Are aware of your surroundings.  Use safety equipment as directed.  Dress in layers when exercising outdoors.  Drink plenty of water to stay well hydrated. Build up endurance slowly. Start with 10 minutes at a time, and gradually build up to doing 30 minutes at a time. Unless your health care provider gave you different instructions, aim to exercise for a total of 150 minutes a week. Spread out that time so you are working on endurance on 3 or more days a week.   Strength exercises Lifting, pulling, or pushing weights helps to strengthen muscles. Having stronger muscles makes it easier to do everyday activities, such as getting up from a chair, climbing stairs, carrying groceries, and playing with grandchildren. Strength exercises include arm and leg exercises that may be done:  With weights.  Without weights (using your own body weight).  With  a resistance band. When doing strength exercises:  Move smoothly and steadily. Do not suddenly thrust or jerk the weights, the resistance band, or your body.  Start with no weights or with light weights, and gradually add more weight over time. Eventually, aim to use weights that are hard or very hard for you to lift. This means that you are able to do 8 repetitions with the weight, and the last few repetitions are very challenging.  Lift or push weights into position for 3 seconds, hold the position for 1 second, and then take 3 seconds to return to your starting position.  Breathe out (exhale) during difficult movements, like lifting or pushing weights. Breathe in (inhale) to relax your muscles before the next repetition.  Consider alternating arms or legs, especially when you first start strength exercises.  Expect some slight muscle soreness after each session. Do strength exercises on 2 or more days a week, for 30 minutes at a time. Avoid exercising the same muscle groups two days in a row. For example, if you work on your leg muscles one day, work on your arm muscles the next day. When you can do two sets of 10-15 repetitions with a certain weight, increase the amount of weight.   Balance Balance exercises can help to prevent falls. Balance exercises include:  Standing on one foot.  Heel-to-toe walk.  Balance walk.  Tai chi. Make sure you have something sturdy to hold onto while doing balance exercises, such as a sturdy chair. As your balance improves, challenge yourself by holding onto the chair with one hand instead of two, and then with no hands. Trying exercises with your eyes closed also challenges your balance, but be sure to have a sturdy surface (like a countertop) close by in case you need it. Do balance exercises as often as you want, or as often as directed by your health care provider. Strength exercises for the lower body also help to improve  balance. Flexibility Flexibility exercises improve how far you can bend, straighten, move, or rotate parts of your body (range of motion). These exercises also help you to do everyday activities such as getting dressed or reaching for objects. Flexibility exercises include stretching different parts of the body, and they may be done in a standing or seated position or on the floor. When stretching, make sure you:  Keep a slight bend in your arms and legs. Avoid completely straightening ("locking") your joints.  Do not stretch so far that you feel pain. You  should feel a mild stretching feeling. You may try stretching farther as you become more flexible over time.  Relax and breathe between stretches.  Hold onto something sturdy for balance as needed. Hold each stretch for 10-30 seconds. Repeat each stretch 3-5 times.   General safety tips  Exercise in well-lit areas.  Do not hold your breath during exercises or stretches.  Warm up before exercising, and cool down after exercising. This can help prevent injury.  Drink plenty of water during exercise or any activity that makes you sweat.  Use smooth, steady movements. Do not use sudden, jerking movements, especially when lifting weights or doing flexibility exercises.  If you are not sure if an exercise is safe for you, or you are not sure how to do an exercise, talk with your health care provider. This is especially important if you have had surgery on muscles, bones, or joints (orthopedic surgery). Where to find more information You can find more information about exercise for older adults from:  Your local health department, fitness center, or community center. These facilities may have programs for aging adults.  [employer] on Aging: http://kim-miller.com/  National Council on Aging: www.ncoa.org Summary  Staying physically active is important as you age.  Make sure to contact your health care provider before you start any  exercise routine. Ask your health care provider what activities are safe for you.  Doing endurance, strength, balance, and flexibility exercises can help to delay or prevent certain diseases, such as heart disease, diabetes, and bone loss (osteoporosis). This information is not intended to replace advice given to you by your health care provider. Make sure you discuss any questions you have with your health care provider. Document Revised: 06/29/2019 Document Reviewed: 06/29/2019 Elsevier Patient Education  2021 [place].   Patient verbalizes understanding of instructions provided today and agrees to view in [place].   Telephone follow up appointment with care management team member scheduled for: 09/10/2020  Peter Garter RN, [facility], CDE Care Management Coordinator [place] [phone], [place] [phone]

## 2020-07-16 NOTE — Chronic Care Management (AMB) (Signed)
Chronic Care Management   CCM RN Visit Note  07/16/2020 Name: Jamie Gamble MRN: [id number] DOB: 1945/11/12  Subjective: Jamie Gamble is a 75 y.o. year old female who is a primary care patient of Isaac Bliss, Rayford Halsted, MD. The care management team was consulted for assistance with disease management and care coordination needs.    Engaged with patient by telephone for follow up visit in response to provider referral for case management and/or care coordination services.   Consent to Services:  The patient was given information about Chronic Care Management services, agreed to services, and gave verbal consent prior to initiation of services.  Please see initial visit note for detailed documentation.   Patient agreed to services and verbal consent obtained.   Assessment: Review of patient past medical history, allergies, medications, health status, including review of consultants reports, laboratory and other test data, was performed as part of comprehensive evaluation and provision of chronic care management services.   SDOH (Social Determinants of Health) assessments and interventions performed:    CCM Care Plan  Allergies  Allergen Reactions  . Nexium [Esomeprazole] Diarrhea    Outpatient Encounter Medications as of 07/16/2020  Medication Sig  . atorvastatin (LIPITOR) 40 MG tablet Take 1 tablet (40 mg total) by mouth daily.  . hydrochlorothiazide (MICROZIDE) 12.5 MG capsule Take 1 capsule (12.5 mg total) by mouth daily.  Marland Kitchen losartan (COZAAR) 25 MG tablet Take 1 tablet (25 mg total) by mouth daily.  Marland Kitchen OVER THE COUNTER MEDICATION Vitamin D3 125 mg  . pantoprazole (PROTONIX) 40 MG tablet Take 1 tablet (40 mg total) by mouth daily. (Patient not taking: Reported on 06/18/2020)  . QUEtiapine (SEROQUEL) 50 MG tablet Take 25 mg by mouth daily.  . traZODone (DESYREL) 150 MG tablet Take 50 mg by mouth daily.   No facility-administered encounter medications on file as of 07/16/2020.     Patient Active Problem List   Diagnosis Date Noted  . Depression   . Hypertension   . Hyperlipidemia   . GERD (gastroesophageal reflux disease)     Conditions to be addressed/monitored:HTN and HLD  Care Plan : RNCM:Hypertension (Adult)  Updates made by Jamie Ped, RN since 07/16/2020 12:00 AM    Problem: Lack of long term self-management of Hypertension   Priority: Medium    Long-Range Goal: Long term effective self-management of hypertension   Start Date: 06/18/2020  Expected End Date: 09/21/2020  This Visit's Progress: On track  Recent Progress: On track  Priority: Medium  Note:    Objective:  . Last practice recorded BP readings:  BP Readings from Last 3 Encounters:  06/05/20 120/80  11/01/19 124/80 .   Marland Kitchen Most recent eGFR/CrCl: No results found for: EGFR  No components found for: CRCL Current Barriers:  Marland Kitchen Knowledge Deficits related to basic understanding of hypertension pathophysiology and self care management . Knowledge Deficits related to understanding of medications prescribed for management of hypertension . Does not adhere to provider recommendations re:  Marland Kitchen Reports she has not started checking her B/P yet.  Reports she is trying to eat healthy and less salt.  States she is walking every few days Case Manager Clinical Goal(s):  . patient will verbalize understanding of plan for hypertension management . patient will attend all scheduled medical appointments: Dr. Jerilee Hoh 12/06/20 . patient will demonstrate improved health management independence as evidenced by checking blood pressure as directed and notifying PCP if SBP>140 or DBP > 90, taking all medications as prescribe, and adhering to  a low sodium diet as discussed. Interventions:  . Collaboration with Isaac Bliss, Rayford Halsted, MD regarding development and update of comprehensive plan of care as evidenced by provider attestation and co-signature . Inter-disciplinary care team collaboration (see  longitudinal plan of care) . Evaluation of current treatment plan related to hypertension self management and patient's adherence to plan as established by provider. . Reinforced education to patient re: stroke prevention, s/s of heart attack and stroke, DASH diet, complications of uncontrolled blood pressure . Reviewed plans with patient for ongoing care management follow up and provided patient with direct contact information for care management team . Reinforced to monitor blood pressure 1-2 times a week and record, calling PCP for findings outside established parameters.  . Reviewed scheduled/upcoming provider appointments including: Dr.Hernandez 12/06/20 . Instructed on importance of regular exercise to help keep B/P in control and encouraged to try doing some type of strength/resistance training twice a week Self-Care Activities: - Self administers medications as prescribed Attends all scheduled provider appointments Calls provider office for new concerns, questions, or BP outside discussed parameters Checks BP and records as discussed Follows a low sodium diet/DASH diet Patient Goals: - check blood pressure weekly - choose a place to take my blood pressure (home, clinic or office, retail store) - write blood pressure results in a log or diary - learn about high blood pressure -increase activity as tolerated Follow Up Plan: Telephone follow up appointment with care management team member scheduled for: 09/10/20 at 10 AM   Care Plan : RNCM: Hyperlipidemia  Updates made by Jamie Ped, RN since 07/16/2020 12:00 AM    Problem: Lack of effective self management of hyperlipidemia   Priority: Medium    Long-Range Goal: Effective self management of hyperlipidemia   Start Date: 06/18/2020  Expected End Date: 09/21/2020  This Visit's Progress: On track  Recent Progress: On track  Priority: Medium  Note:   Current Barriers:  Marland Kitchen Varied  controlled hyperlipidemia, complicated by hx of  HTN and depression . Current antihyperlipidemic regimen: Atorvastatin  . Most recent lipid panel:     Component Value Date/Time   CHOL 166 06/05/2020 1050   TRIG 77.0 06/05/2020 1050   HDL 75.80 06/05/2020 1050   CHOLHDL 2 06/05/2020 1050   VLDL 15.4 06/05/2020 1050   LDLCALC 75 06/05/2020 1050 .   Marland Kitchen ASCVD risk enhancing conditions: age >3,  HTN . Does not adhere to provider recommendations re:  Marland Kitchen Reports she has been walking every few days and she has been trying to eat healthy, reports she received the Advanced Directives  forms but has not reviewed them yet.  States she received her 2nd COVID booster last week and had no issues after the injection. RN Care Manager Clinical Goal(s):  . patient will work with Consulting civil engineer, providers, and care team towards execution of optimized self-health management plan . patient will verbalize understanding of plan for hyperlipidemia . patient will meet with RN Care Manager to address educational needs for self management of hyperlipidemia . patient will attend all scheduled medical appointments: Dr. Jerilee Hoh 12/06/20 . patient will verbalize basic understanding of hyperlipidemia disease process and self health management plan as evidenced by adherence to medications,diet, exercise and follow up with provider . the patient will work with the care management team towards completion of advanced directives Interventions: . Collaboration with Isaac Bliss, Rayford Halsted, MD regarding development and update of comprehensive plan of care as evidenced by provider attestation and co-signature . Inter-disciplinary care  team collaboration (see longitudinal plan of care) . Medication review performed; medication list updated in electronic medical record.  . Evaluation of current treatment plan related to hyperlipidemia and patient's adherence to plan as established by provider. . Instructed pt to call RNCM if she has any questions about the advanced directives  packet and or if she needs assistance completing the forms . Reinforced education on hyperlipidemia  . Reviewed medications with patient and reinforced statin use . Reviewed scheduled/upcoming provider appointments including: Dr. Jerilee Hoh 12/06/20 . Discussed plans with patient for ongoing care management follow up and provided patient with direct contact information for care management team . Reinforced  on importance of regular exercise for lipid management and encouraged to try doing strength/resistance training twice a week Patient Goals/Self-Care Activities: - call for medicine refill 2 or 3 days before it runs out - change to whole grain breads, cereal, pasta - drink 6 to 8 glasses of water each day - eat 3 to 5 servings of fruits and vegetables each day - fill half the plate with nonstarchy vegetables - read food labels for fat, fiber, carbohydrates and portion size - eat smaller or less servings of red meat - fill half the plate with nonstarchy vegetables - increase the amount of fiber in food - read food labels for fat and fiber - switch to low-fat or skim milk -increase activity as tolerated Follow Up Plan: Telephone follow up appointment with care management team member scheduled for: 09/10/20 at 10 AM       Plan:Telephone follow up appointment with care management team member scheduled for:  09/10/2020 and The patient has been provided with contact information for the care management team and has been advised to call with any health related questions or concerns.  Jamie Garter RN, Jamie Gamble, CDE Care Management Coordinator [facility] [phone], Mobile [phone]

## 2020-08-08 ENCOUNTER — Telehealth: Payer: Self-pay | Admitting: Internal Medicine

## 2020-08-08 NOTE — Telephone Encounter (Signed)
error 

## 2020-08-11 ENCOUNTER — Other Ambulatory Visit: Payer: Self-pay | Admitting: Internal Medicine

## 2020-09-10 ENCOUNTER — Ambulatory Visit (INDEPENDENT_AMBULATORY_CARE_PROVIDER_SITE_OTHER): Payer: Medicare PPO

## 2020-09-10 DIAGNOSIS — I10 Essential (primary) hypertension: Secondary | ICD-10-CM

## 2020-09-10 DIAGNOSIS — E785 Hyperlipidemia, unspecified: Secondary | ICD-10-CM

## 2020-09-10 NOTE — Patient Instructions (Addendum)
Visit Information  PATIENT GOALS:  Goals Addressed             This Visit's Progress    Lifestyle Change-Hypertension   On track    Timeframe:  Long-Range Goal Priority:  Medium Start Date:     06/18/20                        Expected End Date:     02/21/21                  Follow Up Date 11/05/20   - check blood pressure weekly - choose a place to take my blood pressure (home, clinic or office, retail store) - write blood pressure results in a log or diary - learn about high blood pressure  -increase activity as tolerated   Why is this important?   The changes that you are asked to make may be hard to do.  This is especially true when the changes are life-long.  Knowing why it is important to you is the first step.  Working on the change with your family or support person helps you not feel alone.  Reward yourself and family or support person when goals are met. This can be an activity you choose like bowling, hiking, biking, swimming or shooting hoops.     Notes:       Manage My Cholesterol   On track    Timeframe:  Long-Range Goal Priority:  Medium Start Date:   06/18/20                          Expected End Date:    02/21/21                   Follow Up Date 11/05/20   - change to whole grain breads, cereal, pasta - eat smaller or less servings of red meat - fill half the plate with nonstarchy vegetables - increase the amount of fiber in food - read food labels for fat and fiber - switch to low-fat or skim milk  -try to increase activity as tolerated Why is this important?   Changing cholesterol starts with eating heart-healthy foods.  Other steps may be to increase your activity and to quit if you smoke.    Notes:        [email or web address]">  DASH Eating Plan DASH stands for Dietary Approaches to Stop Hypertension. The DASH eating plan is a healthy eating plan that has been shown to: Reduce high blood pressure  (hypertension). Reduce your risk for type 2 diabetes, heart disease, and stroke. Help with weight loss. What are tips for following this plan? Reading food labels Check food labels for the amount of salt (sodium) per serving. Choose foods with less than 5 percent of the Daily Value of sodium. Generally, foods with less than 300 milligrams (mg) of sodium per serving fit into this eating plan. To find whole grains, look for the word "whole" as the first word in the ingredient list. Shopping Buy products labeled as "low-sodium" or "no salt added." Buy fresh foods. Avoid canned foods and pre-made or frozen meals. Cooking Avoid adding salt when cooking. Use salt-free seasonings or herbs instead of table salt or sea salt. Check with your health care provider or pharmacist before using salt substitutes. Do not fry foods. Cook foods using healthy methods such as baking, boiling, grilling, roasting, and broiling instead. Cook with  heart-healthy oils, such as olive, canola, avocado, soybean, or sunflower oil. Meal planning  Eat a balanced diet that includes: 4 or more servings of fruits and 4 or more servings of vegetables each day. Try to fill one-half of your plate with fruits and vegetables. 6-8 servings of whole grains each day. Less than 6 oz (170 g) of lean meat, poultry, or fish each day. A 3-oz (85-g) serving of meat is about the same size as a deck of cards. One egg equals 1 oz (28 g). 2-3 servings of low-fat dairy each day. One serving is 1 cup (237 mL). 1 serving of nuts, seeds, or beans 5 times each week. 2-3 servings of heart-healthy fats. Healthy fats called omega-3 fatty acids are found in foods such as walnuts, flaxseeds, fortified milks, and eggs. These fats are also found in cold-water fish, such as sardines, salmon, and mackerel. Limit how much you eat of: Canned or prepackaged foods. Food that is high in trans fat, such as some fried foods. Food that is high in saturated fat, such  as fatty meat. Desserts and other sweets, sugary drinks, and other foods with added sugar. Full-fat dairy products. Do not salt foods before eating. Do not eat more than 4 egg yolks a week. Try to eat at least 2 vegetarian meals a week. Eat more home-cooked food and less restaurant, buffet, and fast food.  Lifestyle When eating at a restaurant, ask that your food be prepared with less salt or no salt, if possible. If you drink alcohol: Limit how much you use to: 0-1 drink a day for women who are not pregnant. 0-2 drinks a day for men. Be aware of how much alcohol is in your drink. In the [place] one drink equals one 12 oz bottle of beer (355 mL), one 5 oz glass of wine (148 mL), or one 1 oz glass of hard liquor (44 mL). General information Avoid eating more than 2,300 mg of salt a day. If you have hypertension, you may need to reduce your sodium intake to 1,500 mg a day. Work with your health care provider to maintain a healthy body weight or to lose weight. Ask what an ideal weight is for you. Get at least 30 minutes of exercise that causes your heart to beat faster (aerobic exercise) most days of the week. Activities may include walking, swimming, or biking. Work with your health care provider or dietitian to adjust your eating plan to your individual calorie needs. What foods should I eat? Fruits All fresh, dried, or frozen fruit. Canned fruit in natural juice (without addedsugar). Vegetables Fresh or frozen vegetables (raw, steamed, roasted, or grilled). Low-sodium or reduced-sodium tomato and vegetable juice. Low-sodium or reduced-sodium tomatosauce and tomato paste. Low-sodium or reduced-sodium canned vegetables. Grains Whole-grain or whole-wheat bread. Whole-grain or whole-wheat pasta. Brown rice. [place]. Bulgur. Whole-grain and low-sodium cereals. Pita bread.Low-fat, low-sodium crackers. Whole-wheat flour tortillas. Meats and other proteins Skinless chicken or [place].  Ground chicken or [place]. Pork with fat trimmed off. Fish and seafood. Egg whites. Dried beans, peas, or lentils. Unsalted nuts, nut butters, and seeds. Unsalted canned beans. Lean cuts of beef with fat trimmed off. Low-sodium, lean precooked or cured meat, such as sausages or meatloaves. Dairy Low-fat (1%) or fat-free (skim) milk. Reduced-fat, low-fat, or fat-free cheeses. Nonfat, low-sodium ricotta or cottage cheese. Low-fat or nonfatyogurt. Low-fat, low-sodium cheese. Fats and oils Soft margarine without trans fats. Vegetable oil. Reduced-fat, low-fat, or light mayonnaise and salad dressings (reduced-sodium). Canola, safflower,  olive, avocado, soybean, andsunflower oils. Avocado. Seasonings and condiments Herbs. Spices. Seasoning mixes without salt. Other foods Unsalted popcorn and pretzels. Fat-free sweets. The items listed above may not be a complete list of foods and beverages you can eat. Contact a dietitian for more information. What foods should I avoid? Fruits Canned fruit in a light or heavy syrup. Fried fruit. Fruit in cream or buttersauce. Vegetables Creamed or fried vegetables. Vegetables in a cheese sauce. Regular canned vegetables (not low-sodium or reduced-sodium). Regular canned tomato sauce and paste (not low-sodium or reduced-sodium). Regular tomato and vegetable juice(not low-sodium or reduced-sodium). Angie Fava. Olives. Grains Baked goods made with fat, such as croissants, muffins, or some breads. Drypasta or rice meal packs. Meats and other proteins Fatty cuts of meat. Ribs. Fried meat. Berniece Salines. Bologna, salami, and other precooked or cured meats, such as sausages or meat loaves. Fat from the back of a pig (fatback). Bratwurst. Salted nuts and seeds. Canned beans with added salt. Canned orsmoked fish. Whole eggs or egg yolks. Chicken or [place] with skin. Dairy Whole or 2% milk, cream, and half-and-half. Whole or full-fat cream cheese. Whole-fat or sweetened yogurt. Full-fat  cheese. Nondairy creamers. Whippedtoppings. Processed cheese and cheese spreads. Fats and oils Butter. Stick margarine. Lard. Shortening. Ghee. Bacon fat. Tropical oils, suchas coconut, palm kernel, or palm oil. Seasonings and condiments Onion salt, garlic salt, seasoned salt, table salt, and sea salt. Worcestershire sauce. Tartar sauce. Barbecue sauce. Teriyaki sauce. Soy sauce, including reduced-sodium. Steak sauce. Canned and packaged gravies. Fish sauce. Oyster sauce. Cocktail sauce. Store-bought horseradish. Ketchup. Mustard. Meat flavorings and tenderizers. Bouillon cubes. Hot sauces. Pre-made or packaged marinades. Pre-made or packaged taco seasonings. Relishes. Regular saladdressings. Other foods Salted popcorn and pretzels. The items listed above may not be a complete list of foods and beverages you should avoid. Contact a dietitian for more information. Where to find more information National Heart, Lung, and Blood Institute: https://wilson-eaton.com/ American Heart Association: www.heart.org Academy of Nutrition and Dietetics: www.eatright.[place]: www.kidney.org Summary The DASH eating plan is a healthy eating plan that has been shown to reduce high blood pressure (hypertension). It may also reduce your risk for type 2 diabetes, heart disease, and stroke. When on the DASH eating plan, aim to eat more fresh fruits and vegetables, whole grains, lean proteins, low-fat dairy, and heart-healthy fats. With the DASH eating plan, you should limit salt (sodium) intake to 2,300 mg a day. If you have hypertension, you may need to reduce your sodium intake to 1,500 mg a day. Work with your health care provider or dietitian to adjust your eating plan to your individual calorie needs. This information is not intended to replace advice given to you by your health care provider. Make sure you discuss any questions you have with your healthcare provider. Document Revised: 02/11/2019  Document Reviewed: 02/11/2019 Elsevier Patient Education  2022 [employer].   Patient verbalizes understanding of instructions provided today and agrees to view in [place].   Telephone follow up appointment with care management team member scheduled for: 11/05/20  Peter Garter RN, [facility], CDE Care Management Coordinator [place] [phone], Mobile [phone]

## 2020-09-10 NOTE — Chronic Care Management (AMB) (Signed)
Chronic Care Management   CCM RN Visit Note  09/10/2020 Name: Jamie Gamble MRN: [id number] DOB: 01/15/1946  Subjective: Jamie Gamble is a 75 y.o. year old female who is a primary care patient of Isaac Bliss, Rayford Halsted, MD. The care management team was consulted for assistance with disease management and care coordination needs.    Engaged with patient by telephone for follow up visit in response to provider referral for case management and/or care coordination services.   Consent to Services:  The patient was given information about Chronic Care Management services, agreed to services, and gave verbal consent prior to initiation of services.  Please see initial visit note for detailed documentation.   Patient agreed to services and verbal consent obtained.   Assessment: Review of patient past medical history, allergies, medications, health status, including review of consultants reports, laboratory and other test data, was performed as part of comprehensive evaluation and provision of chronic care management services.   SDOH (Social Determinants of Health) assessments and interventions performed:    CCM Care Plan  Allergies  Allergen Reactions   Nexium [Esomeprazole] Diarrhea    Outpatient Encounter Medications as of 09/10/2020  Medication Sig   Probiotic Product (PROBIOTIC DAILY PO) Take 1 capsule by mouth 1 day or 1 dose.   atorvastatin (LIPITOR) 40 MG tablet Take 1 tablet (40 mg total) by mouth daily.   hydrochlorothiazide (MICROZIDE) 12.5 MG capsule TAKE 1 CAPSULE(12.5 MG) BY MOUTH DAILY   losartan (COZAAR) 25 MG tablet TAKE 1 TABLET(25 MG) BY MOUTH DAILY   OVER THE COUNTER MEDICATION Vitamin D3 125 mg   pantoprazole (PROTONIX) 40 MG tablet Take 1 tablet (40 mg total) by mouth daily. (Patient not taking: Reported on 06/18/2020)   QUEtiapine (SEROQUEL) 50 MG tablet Take 25 mg by mouth daily.   traZODone (DESYREL) 150 MG tablet Take 50 mg by mouth daily.   No  facility-administered encounter medications on file as of 09/10/2020.    Patient Active Problem List   Diagnosis Date Noted   Depression    Hypertension    Hyperlipidemia    GERD (gastroesophageal reflux disease)     Conditions to be addressed/monitored:HTN and HLD  Care Plan : RNCM:Hypertension (Adult)  Updates made by Dimitri Ped, RN since 09/10/2020 12:00 AM     Problem: Lack of long term self-management of Hypertension   Priority: Medium     Long-Range Goal: Long term effective self-management of hypertension   Start Date: 06/18/2020  Expected End Date: 02/21/2021  This Visit's Progress: On track  Recent Progress: On track  Priority: Medium  Note:    Objective:  Last practice recorded BP readings:  BP Readings from Last 3 Encounters:  06/05/20 120/80  11/01/19 124/80   Most recent eGFR/CrCl: No results found for: EGFR  No components found for: CRCL Current Barriers:  Knowledge Deficits related to basic understanding of hypertension pathophysiology and self care management Knowledge Deficits related to understanding of medications prescribed for management of hypertension Does not adhere to provider recommendations re:  Reports she has not been checking her B/P.  Reports she is trying to eat healthy and less salt.  States she has been pool walking 3 times a week.  States she has lost some weight from eating healthier Case Manager Clinical Goal(s):  patient will verbalize understanding of plan for hypertension management patient will attend all scheduled medical appointments: Dr. Jerilee Hoh 12/06/20 patient will demonstrate improved health management independence as evidenced by checking blood pressure as directed  and notifying PCP if SBP>140 or DBP > 90, taking all medications as prescribe, and adhering to a low sodium diet as discussed. Interventions:  Collaboration with Isaac Bliss, Rayford Halsted, MD regarding development and update of comprehensive plan of care as  evidenced by provider attestation and co-signature Inter-disciplinary care team collaboration (see longitudinal plan of care) Evaluation of current treatment plan related to hypertension self management and patient's adherence to plan as established by provider. Reinforced education to patient re: stroke prevention, s/s of heart attack and stroke, DASH diet, complications of uncontrolled blood pressure Reviewed plans with patient for ongoing care management follow up and provided patient with direct contact information for care management team Reinforced to monitor blood pressure 1-2 times a week and record, calling PCP for findings outside established parameters.  Reviewed scheduled/upcoming provider appointments including: Dr.Hernandez 12/06/20 Reinforced importance of regular exercise to help keep B/P in control and encouraged to try doing some type of strength/resistance training twice a week Encouraged to continue to slowly lose weight and to continue to eat healthy Self-Care Activities: - Self administers medications as prescribed Attends all scheduled provider appointments Calls provider office for new concerns, questions, or BP outside discussed parameters Checks BP and records as discussed Follows a low sodium diet/DASH diet Patient Goals: - check blood pressure weekly - choose a place to take my blood pressure (home, clinic or office, retail store) - write blood pressure results in a log or diary - learn about high blood pressure -increase activity as tolerated Follow Up Plan: Telephone follow up appointment with care management team member scheduled for: 11/05/20 at 1 PM The patient has been provided with contact information for the care management team and has been advised to call with any health related questions or concerns.      Care Plan : RNCM: Hyperlipidemia  Updates made by Dimitri Ped, RN since 09/10/2020 12:00 AM     Problem: Lack of effective self management of  hyperlipidemia   Priority: Medium     Long-Range Goal: Effective self management of hyperlipidemia   Start Date: 06/18/2020  Expected End Date: 02/21/2021  This Visit's Progress: On track  Recent Progress: On track  Priority: Medium  Note:   Current Barriers:  Varied  controlled hyperlipidemia, complicated by hx of HTN and depression Current antihyperlipidemic regimen: Atorvastatin  Most recent lipid panel:     Component Value Date/Time   CHOL 166 06/05/2020 1050   TRIG 77.0 06/05/2020 1050   HDL 75.80 06/05/2020 1050   CHOLHDL 2 06/05/2020 1050   VLDL 15.4 06/05/2020 1050   Thayer 75 06/05/2020 1050   ASCVD risk enhancing conditions: age >20,  HTN Does not adhere to provider recommendations re:  Reports she has been pool walking 3 times a week.  Reports she has been trying to eat healthy and is not eating later at night. States she has started taking a probiotic and it has helped with her bowels  RN Care Manager Clinical Goal(s):  patient will work with Consulting civil engineer, providers, and care team towards execution of optimized self-health management plan patient will verbalize understanding of plan for hyperlipidemia patient will meet with RN Care Manager to address educational needs for self management of hyperlipidemia patient will attend all scheduled medical appointments: Dr. Jerilee Hoh 12/06/20 patient will verbalize basic understanding of hyperlipidemia disease process and self health management plan as evidenced by adherence to medications,diet, exercise and follow up with provider the patient will work with the care management team  towards completion of advanced directives Interventions: Collaboration with Isaac Bliss, Rayford Halsted, MD regarding development and update of comprehensive plan of care as evidenced by provider attestation and co-signature Inter-disciplinary care team collaboration (see longitudinal plan of care) Medication review performed; medication list  updated in electronic medical record.  Evaluation of current treatment plan related to hyperlipidemia and patient's adherence to plan as established by provider. Reinforced pt to call RNCM if she has any questions about the advanced directives packet and or if she needs assistance completing the forms Reinforced education on hyperlipidemia  Reviewed medications with patient and reinforced statin use Reviewed scheduled/upcoming provider appointments including: Dr. Jerilee Hoh 12/06/20 Discussed plans with patient for ongoing care management follow up and provided patient with direct contact information for care management team Reinforced  on importance of regular exercise for lipid management and encouraged to try doing strength/resistance training twice a week Encouraged to continue her pool walking and eating healther Patient Goals/Self-Care Activities: - call for medicine refill 2 or 3 days before it runs out - change to whole grain breads, cereal, pasta - drink 6 to 8 glasses of water each day - eat 3 to 5 servings of fruits and vegetables each day - fill half the plate with nonstarchy vegetables - read food labels for fat, fiber, carbohydrates and portion size - eat smaller or less servings of red meat - fill half the plate with nonstarchy vegetables - increase the amount of fiber in food - read food labels for fat and fiber - switch to low-fat or skim milk -increase activity as tolerated Follow Up Plan: Telephone follow up appointment with care management team member scheduled for: 11/05/20 at 1 PM The patient has been provided with contact information for the care management team and has been advised to call with any health related questions or concerns.         Plan:Telephone follow up appointment with care management team member scheduled for:  11/05/20 and The patient has been provided with contact information for the care management team and has been advised to call with any health  related questions or concerns.  Peter Garter RN, Jackquline Denmark, CDE Care Management Coordinator [facility] [phone], Mobile [phone]

## 2020-10-11 ENCOUNTER — Encounter: Payer: Self-pay | Admitting: Internal Medicine

## 2020-10-11 MED ORDER — ATORVASTATIN CALCIUM 40 MG PO TABS: 40.0000 mg | ORAL_TABLET | Freq: Every day | ORAL | 1 refills | Status: DC

## 2020-10-17 ENCOUNTER — Other Ambulatory Visit: Payer: Self-pay

## 2020-10-18 ENCOUNTER — Encounter: Payer: Self-pay | Admitting: Internal Medicine

## 2020-10-18 ENCOUNTER — Ambulatory Visit: Payer: Medicare PPO | Admitting: Internal Medicine

## 2020-10-18 VITALS — BP 130/80 | HR 73 | Temp 98.5°F | Wt 165.4 lb

## 2020-10-18 DIAGNOSIS — Z708 Other sex counseling: Secondary | ICD-10-CM

## 2020-10-18 DIAGNOSIS — M79645 Pain in left finger(s): Secondary | ICD-10-CM | POA: Diagnosis not present

## 2020-10-18 NOTE — Progress Notes (Signed)
Established Patient Office Visit     This visit occurred during the SARS-CoV-2 public health emergency.  Safety protocols were in place, including screening questions prior to the visit, additional usage of staff PPE, and extensive cleaning of exam room while observing appropriate contact time as indicated for disinfecting solutions.    CC/Reason for Visit: Discuss left thumb pain and other concerns  HPI: Jamie Gamble is a 75 y.o. female who is coming in today for the above mentioned reasons. Past Medical History is significant for: Hypertension, hyperlipidemia, GERD and well-controlled depression.  She has been having left thumb pain for a couple of weeks.  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  She wants to make sure she does not have carpal tunnel syndrome.  She then proceeds to ask me whether everything that she discloses to me needs to be written in the chart.  I tell her that I can make the note sensitive so that only those involved in her direct patient care can access it.  She then proceeds to tell me that she has now become newly sexually active.  She had contracted herpes years ago and wants to talk about how to disclose that with her current partner.  She does not have any STD symptoms and declines STD testing today.   Past Medical/Surgical History: Past Medical History:  Diagnosis Date   Depression    GERD (gastroesophageal reflux disease)    Hyperlipidemia    Hypertension     No past surgical history on file.  Social History:  reports that she has quit smoking. She has never used smokeless tobacco. She reports previous alcohol use. She reports that she does not use drugs.  Allergies: Allergies  Allergen Reactions   Nexium [Esomeprazole] Diarrhea    Family History:  Family History  Problem Relation Age of Onset   Depression Mother    Depression Brother      Current Outpatient Medications:    atorvastatin (LIPITOR) 40 MG tablet, Take 1  tablet (40 mg total) by mouth daily., Disp: 90 tablet, Rfl: 1   hydrochlorothiazide (MICROZIDE) 12.5 MG capsule, TAKE 1 CAPSULE(12.5 MG) BY MOUTH DAILY, Disp: 90 capsule, Rfl: 0   losartan (COZAAR) 25 MG tablet, TAKE 1 TABLET(25 MG) BY MOUTH DAILY, Disp: 90 tablet, Rfl: 0   OVER THE COUNTER MEDICATION, Vitamin D3 125 mg, Disp: , Rfl:    Probiotic Product (PROBIOTIC DAILY PO), Take 1 capsule by mouth 1 day or 1 dose., Disp: , Rfl:    QUEtiapine (SEROQUEL) 50 MG tablet, Take 25 mg by mouth daily., Disp: , Rfl:    traZODone (DESYREL) 150 MG tablet, Take 50 mg by mouth daily., Disp: , Rfl:   Review of Systems:  Constitutional: Denies fever, chills, diaphoresis, appetite change and fatigue.  HEENT: Denies photophobia, eye pain, redness, hearing loss, ear pain, congestion, sore throat, rhinorrhea, sneezing, mouth sores, trouble swallowing, neck pain, neck stiffness and tinnitus.   Respiratory: Denies SOB, DOE, cough, chest tightness,  and wheezing.   Cardiovascular: Denies chest pain, palpitations and leg swelling.  Gastrointestinal: Denies nausea, vomiting, abdominal pain, diarrhea, constipation, blood in stool and abdominal distention.  Genitourinary: Denies dysuria, urgency, frequency, hematuria, flank pain and difficulty urinating.  Endocrine: Denies: hot or cold intolerance, sweats, changes in hair or nails, polyuria, polydipsia. Musculoskeletal: Denies myalgias, back pain,  and gait problem.  Skin: Denies pallor, rash and wound.  Neurological: Denies dizziness, seizures, syncope, weakness, light-headedness, numbness and  headaches.  Hematological: Denies adenopathy. Easy bruising, personal or family bleeding history  Psychiatric/Behavioral: Denies suicidal ideation, mood changes, confusion, nervousness, sleep disturbance and agitation    Physical Exam: Vitals:   10/18/20 0930  BP: 130/80  Pulse: 73  Temp: 98.5 F (36.9 C)  TempSrc: Oral  SpO2: 97%  Weight: 165 lb 6.4 oz (75 kg)     Body mass index is 28.84 kg/m.   Constitutional: NAD, calm, comfortable Eyes: PERRL, lids and conjunctivae normal ENMT: Mucous membranes are moist.  Neurologic: Grossly intact and nonfocal Psychiatric: Normal judgment and insight. Alert and oriented x 3. Normal mood.    Impression and Plan:  Pain of left thumb -No numbness or tingling of the thumb, index or middle finger.  I believe this is unlikely to be carpal tunnel syndrome. -Likely tendinitis of that thumb due to her knitting and computer usage. -She has a compressive brace that she is already using, have also advised some anti-inflammatories like ibuprofen for the next 5 days.  Counseling on sexually transmitted disease -We have discussed at length safe sex practices including condom usage. -We have discussed that approximately 98% of the sexually active population will test positive for herpes, we do not treat unless there is an outbreak which she has not had ever. -We have discussed that it is important to disclose this to all sexual partners so that they are aware of the risk of contracting herpes if they do not have it already. -She is declining STD screening today.  Time spent: 32 minutes reviewing chart, interviewing and examining patient, discussing safe sex practices and formulating plan of care.     [name], MD [facility]

## 2020-11-05 ENCOUNTER — Telehealth: Payer: Medicare PPO

## 2020-11-16 ENCOUNTER — Encounter: Payer: Self-pay | Admitting: Internal Medicine

## 2020-11-16 ENCOUNTER — Telehealth (INDEPENDENT_AMBULATORY_CARE_PROVIDER_SITE_OTHER): Payer: Medicare PPO | Admitting: Internal Medicine

## 2020-11-16 VITALS — Wt 160.0 lb

## 2020-11-16 DIAGNOSIS — N951 Menopausal and female climacteric states: Secondary | ICD-10-CM

## 2020-11-16 MED ORDER — ESTROGENS, CONJUGATED 0.625 MG/GM VA CREA
1.0000 | TOPICAL_CREAM | Freq: Every day | VAGINAL | 12 refills | Status: DC
Start: 2020-11-16 — End: 2021-02-27

## 2020-11-16 NOTE — Progress Notes (Signed)
Virtual Visit via Video Note  I connected with Jamie Gamble on 11/16/20 at  8:00 AM EDT by a video enabled telemedicine application and verified that I am speaking with the correct person using two identifiers.  Location patient: home Location provider: work office Persons participating in the virtual visit: patient, provider  I discussed the limitations of evaluation and management by telemedicine and the availability of in person appointments. The patient expressed understanding and agreed to proceed.   HPI: She has scheduled this visit to discuss her postmenopausal vaginal dryness.  She has recently become sexually active again and a friend mentioned that estrogen vaginal cream might be helpful.  She is otherwise doing well and has no acute concerns   ROS: Constitutional: Denies fever, chills, diaphoresis, appetite change and fatigue.  HEENT: Denies photophobia, eye pain, redness, hearing loss, ear pain, congestion, sore throat, rhinorrhea, sneezing, mouth sores, trouble swallowing, neck pain, neck stiffness and tinnitus.   Respiratory: Denies SOB, DOE, cough, chest tightness,  and wheezing.   Cardiovascular: Denies chest pain, palpitations and leg swelling.  Gastrointestinal: Denies nausea, vomiting, abdominal pain, diarrhea, constipation, blood in stool and abdominal distention.  Genitourinary: Denies dysuria, urgency, frequency, hematuria, flank pain and difficulty urinating.  Endocrine: Denies: hot or cold intolerance, sweats, changes in hair or nails, polyuria, polydipsia. Musculoskeletal: Denies myalgias, back pain, joint swelling, arthralgias and gait problem.  Skin: Denies pallor, rash and wound.  Neurological: Denies dizziness, seizures, syncope, weakness, light-headedness, numbness and headaches.  Hematological: Denies adenopathy. Easy bruising, personal or family bleeding history  Psychiatric/Behavioral: Denies suicidal ideation, mood changes, confusion, nervousness, sleep  disturbance and agitation   Past Medical History:  Diagnosis Date   Depression    GERD (gastroesophageal reflux disease)    Hyperlipidemia    Hypertension     No past surgical history on file.  Family History  Problem Relation Age of Onset   Depression Mother    Depression Brother     SOCIAL HX:   reports that she has quit smoking. She has never used smokeless tobacco. She reports that she does not currently use alcohol. She reports that she does not use drugs.   Current Outpatient Medications:    atorvastatin (LIPITOR) 40 MG tablet, Take 1 tablet (40 mg total) by mouth daily., Disp: 90 tablet, Rfl: 1   conjugated estrogens (PREMARIN) vaginal cream, Place 1 Applicatorful vaginally daily., Disp: 42.5 g, Rfl: 12   hydrochlorothiazide (MICROZIDE) 12.5 MG capsule, TAKE 1 CAPSULE(12.5 MG) BY MOUTH DAILY, Disp: 90 capsule, Rfl: 0   losartan (COZAAR) 25 MG tablet, TAKE 1 TABLET(25 MG) BY MOUTH DAILY, Disp: 90 tablet, Rfl: 0   OVER THE COUNTER MEDICATION, Vitamin D3 125 mg, Disp: , Rfl:    Probiotic Product (PROBIOTIC DAILY PO), Take 1 capsule by mouth 1 day or 1 dose., Disp: , Rfl:    QUEtiapine (SEROQUEL) 50 MG tablet, Take 25 mg by mouth daily., Disp: , Rfl:    traZODone (DESYREL) 150 MG tablet, Take 50 mg by mouth daily., Disp: , Rfl:   EXAM:   VITALS per patient if applicable: None reported  GENERAL: alert, oriented, appears well and in no acute distress  HEENT: atraumatic, conjunttiva clear, no obvious abnormalities on inspection of external nose and ears  NECK: normal movements of the head and neck  LUNGS: on inspection no signs of respiratory distress, breathing rate appears normal, no obvious gross increased work of breathing, gasping or wheezing  CV: no obvious cyanosis  MS: moves  all visible extremities without noticeable abnormality  PSYCH/NEURO: pleasant and cooperative, no obvious depression or anxiety, speech and thought processing grossly intact  ASSESSMENT  AND PLAN:   Menopausal vaginal dryness  - Plan: conjugated estrogens (PREMARIN) vaginal cream     I discussed the assessment and treatment plan with the patient. The patient was provided an opportunity to ask questions and all were answered. The patient agreed with the plan and demonstrated an understanding of the instructions.   The patient was advised to call back or seek an in-person evaluation if the symptoms worsen or if the condition fails to improve as anticipated.    Jamie Jan, MD  [facility] at [facility]

## 2020-11-19 ENCOUNTER — Ambulatory Visit (INDEPENDENT_AMBULATORY_CARE_PROVIDER_SITE_OTHER): Payer: Medicare PPO

## 2020-11-19 DIAGNOSIS — I10 Essential (primary) hypertension: Secondary | ICD-10-CM | POA: Diagnosis not present

## 2020-11-19 DIAGNOSIS — E785 Hyperlipidemia, unspecified: Secondary | ICD-10-CM | POA: Diagnosis not present

## 2020-11-19 NOTE — Patient Instructions (Signed)
Visit Information  PATIENT GOALS:  Goals Addressed             This Visit's Progress    Lifestyle Change-Hypertension   On track    Timeframe:  Long-Range Goal Priority:  Medium Start Date:     06/18/20                        Expected End Date:     02/21/21                  Follow Up Date 10/17//22   - check blood pressure weekly - choose a place to take my blood pressure (home, clinic or office, retail store) - write blood pressure results in a log or diary - learn about high blood pressure  -increase activity as tolerated   Why is this important?   The changes that you are asked to make may be hard to do.  This is especially true when the changes are life-long.  Knowing why it is important to you is the first step.  Working on the change with your family or support person helps you not feel alone.  Reward yourself and family or support person when goals are met. This can be an activity you choose like bowling, hiking, biking, swimming or shooting hoops.     Notes:      Manage My Cholesterol   On track    Timeframe:  Long-Range Goal Priority:  Medium Start Date:   06/18/20                          Expected End Date:    02/21/21                   Follow Up Date 01/07/21   - change to whole grain breads, cereal, pasta - eat smaller or less servings of red meat - fill half the plate with nonstarchy vegetables - increase the amount of fiber in food - read food labels for fat and fiber - switch to low-fat or skim milk  -try to increase activity as tolerated Why is this important?   Changing cholesterol starts with eating heart-healthy foods.  Other steps may be to increase your activity and to quit if you smoke.    Notes:         Patient verbalizes understanding of instructions provided today and agrees to view in [place].   Telephone follow up appointment with care management team member scheduled for: 01/07/21 at 11:30 AM  Peter Garter RN, Jackquline Denmark, CDE Care  Management Coordinator [facility] [phone], Mobile [phone]

## 2020-11-19 NOTE — Chronic Care Management (AMB) (Signed)
Chronic Care Management   CCM RN Visit Note  11/19/2020 Name: Pauleen Goleman MRN: [id number] DOB: 12/01/1945  Subjective: Jamie Gamble is a 75 y.o. year old female who is a primary care patient of Isaac Bliss, Rayford Halsted, MD. The care management team was consulted for assistance with disease management and care coordination needs.    Engaged with patient by telephone for follow up visit in response to provider referral for case management and/or care coordination services.   Consent to Services:  The patient was given information about Chronic Care Management services, agreed to services, and gave verbal consent prior to initiation of services.  Please see initial visit note for detailed documentation.   Patient agreed to services and verbal consent obtained.   Assessment: Review of patient past medical history, allergies, medications, health status, including review of consultants reports, laboratory and other test data, was performed as part of comprehensive evaluation and provision of chronic care management services.   SDOH (Social Determinants of Health) assessments and interventions performed:  SDOH Interventions    Flowsheet Row Most Recent Value  SDOH Interventions   Financial Strain Interventions Intervention Not Indicated  Physical Activity Interventions Intervention Not Indicated        CCM Care Plan  Allergies  Allergen Reactions   Nexium [Esomeprazole] Diarrhea    Outpatient Encounter Medications as of 11/19/2020  Medication Sig   atorvastatin (LIPITOR) 40 MG tablet Take 1 tablet (40 mg total) by mouth daily.   conjugated estrogens (PREMARIN) vaginal cream Place 1 Applicatorful vaginally daily.   hydrochlorothiazide (MICROZIDE) 12.5 MG capsule TAKE 1 CAPSULE(12.5 MG) BY MOUTH DAILY   losartan (COZAAR) 25 MG tablet TAKE 1 TABLET(25 MG) BY MOUTH DAILY   OVER THE COUNTER MEDICATION Vitamin D3 125 mg   Probiotic Product (PROBIOTIC DAILY PO) Take 1 capsule by mouth 1 day  or 1 dose.   QUEtiapine (SEROQUEL) 50 MG tablet Take 25 mg by mouth daily.   traZODone (DESYREL) 150 MG tablet Take 50 mg by mouth daily.   No facility-administered encounter medications on file as of 11/19/2020.    Patient Active Problem List   Diagnosis Date Noted   Depression    Hypertension    Hyperlipidemia    GERD (gastroesophageal reflux disease)     Conditions to be addressed/monitored:HTN and HLD  Care Plan : RNCM:Hypertension (Adult)  Updates made by Dimitri Ped, RN since 11/19/2020 12:00 AM     Problem: Lack of long term self-management of Hypertension   Priority: Medium     Long-Range Goal: Long term effective self-management of hypertension   Start Date: 06/18/2020  Expected End Date: 02/21/2021  This Visit's Progress: On track  Recent Progress: On track  Priority: Medium  Note:    Objective:  Last practice recorded BP readings:  BP Readings from Last 3 Encounters:  06/05/20 120/80  11/01/19 124/80   Most recent eGFR/CrCl: No results found for: EGFR  No components found for: CRCL Current Barriers:  Knowledge Deficits related to basic understanding of hypertension pathophysiology and self care management Knowledge Deficits related to understanding of medications prescribed for management of hypertension Does not adhere to provider recommendations re:  Reports she has not been checking her B/P at home but it is good when she goes to the doctor.  Reports she is trying to eat healthy and less salt.  States she is walking on the treadmill daily for 60 minutes and she is doing weight training also.  States she has lost weight  from eating healthier but does not know how much weight.  States she has gone down several sizes in her clothes Case Manager Clinical Goal(s):  patient will verbalize understanding of plan for hypertension management patient will attend all scheduled medical appointments: Dr. Jerilee Hoh 11/20/20 video visit, 12/06/20 patient will demonstrate  improved health management independence as evidenced by checking blood pressure as directed and notifying PCP if SBP>140 or DBP > 90, taking all medications as prescribe, and adhering to a low sodium diet as discussed. Interventions:  Collaboration with Isaac Bliss, Rayford Halsted, MD regarding development and update of comprehensive plan of care as evidenced by provider attestation and co-signature Inter-disciplinary care team collaboration (see longitudinal plan of care) Evaluation of current treatment plan related to hypertension self management and patient's adherence to plan as established by provider. Reinforced education to patient re: stroke prevention, s/s of heart attack and stroke, DASH diet, complications of uncontrolled blood pressure Reviewed plans with patient for ongoing care management follow up and provided patient with direct contact information for care management team Reinforced to monitor blood pressure 1-2 times a week and record, calling PCP for findings outside established parameters.  Reviewed scheduled/upcoming provider appointments including: Dr.Hernandez 12/06/20 Reinforced importance of regular exercise to help keep B/P in control and reinforced to try doing some type of strength/resistance training twice a week Reinforced to continue to slowly lose weight and to continue to eat healthy Self-Care Activities: - Self administers medications as prescribed Attends all scheduled provider appointments Calls provider office for new concerns, questions, or BP outside discussed parameters Checks BP and records as discussed Follows a low sodium diet/DASH diet Patient Goals: - check blood pressure weekly - choose a place to take my blood pressure (home, clinic or office, retail store) - write blood pressure results in a log or diary - learn about high blood pressure -increase activity as tolerated Follow Up Plan: Telephone follow up appointment with care management team member  scheduled for: 01/07/21 at 11:30 AM The patient has been provided with contact information for the care management team and has been advised to call with any health related questions or concerns.      Care Plan : RNCM: Hyperlipidemia  Updates made by Dimitri Ped, RN since 11/19/2020 12:00 AM     Problem: Lack of effective self management of hyperlipidemia   Priority: Medium     Long-Range Goal: Effective self management of hyperlipidemia   Start Date: 06/18/2020  Expected End Date: 02/21/2021  This Visit's Progress: On track  Recent Progress: On track  Priority: Medium  Note:   Current Barriers:  Varied  controlled hyperlipidemia, complicated by hx of HTN and depression Current antihyperlipidemic regimen: Atorvastatin  Most recent lipid panel:     Component Value Date/Time   CHOL 166 06/05/2020 1050   TRIG 77.0 06/05/2020 1050   HDL 75.80 06/05/2020 1050   CHOLHDL 2 06/05/2020 1050   VLDL 15.4 06/05/2020 1050   [place] 75 06/05/2020 1050   ASCVD risk enhancing conditions: age >19,  HTN Does not adhere to provider recommendations re:  Reports she has been walking on the treadmill daily for about 60 minutes and doing weight training  Reports she has been trying to eat healthy and is not eating later at night. States she is taking a probiotic and it has helped with her bowels.   RN Care Manager Clinical Goal(s):  patient will work with Consulting civil engineer, providers, and care team towards execution of optimized self-health management  plan patient will verbalize understanding of plan for hyperlipidemia patient will meet with RN Care Manager to address educational needs for self management of hyperlipidemia patient will attend all scheduled medical appointments: Dr. Jerilee Hoh  11/20/20 video visit9/15/22 patient will verbalize basic understanding of hyperlipidemia disease process and self health management plan as evidenced by adherence to medications,diet, exercise and follow up  with provider the patient will work with the care management team towards completion of advanced directives Interventions: Collaboration with Isaac Bliss, Rayford Halsted, MD regarding development and update of comprehensive plan of care as evidenced by provider attestation and co-signature Inter-disciplinary care team collaboration (see longitudinal plan of care) Medication review performed; medication list updated in electronic medical record.  Evaluation of current treatment plan related to hyperlipidemia and patient's adherence to plan as established by provider Reinforced education on hyperlipidemia  Reviewed medications with patient and reinforced statin use Reviewed scheduled/upcoming provider appointments including: Dr. Jerilee Hoh 11/20/20 video visit, 12/06/20 Discussed plans with patient for ongoing care management follow up and provided patient with direct contact information for care management team Reinforced  on importance of regular exercise for lipid management and encouraged to try doing strength/resistance training twice a week Reinforced to continue her daily exercise and eating healther Patient Goals/Self-Care Activities: - call for medicine refill 2 or 3 days before it runs out - change to whole grain breads, cereal, pasta - drink 6 to 8 glasses of water each day - eat 3 to 5 servings of fruits and vegetables each day - fill half the plate with nonstarchy vegetables - read food labels for fat, fiber, carbohydrates and portion size - eat smaller or less servings of red meat - fill half the plate with nonstarchy vegetables - increase the amount of fiber in food - read food labels for fat and fiber - switch to low-fat or skim milk -increase activity as tolerated Follow Up Plan: Telephone follow up appointment with care management team member scheduled for: 01/07/21 at 11:30 AM The patient has been provided with contact information for the care management team and has been  advised to call with any health related questions or concerns.         Plan:Telephone follow up appointment with care management team member scheduled for:  01/07/21 and The patient has been provided with contact information for the care management team and has been advised to call with any health related questions or concerns.  Peter Garter RN, Jackquline [place], CDE Care Management Coordinator [facility] [phone], [place] [phone]

## 2020-11-20 ENCOUNTER — Encounter: Payer: Self-pay | Admitting: Internal Medicine

## 2020-11-20 ENCOUNTER — Telehealth (INDEPENDENT_AMBULATORY_CARE_PROVIDER_SITE_OTHER): Payer: Medicare PPO | Admitting: Internal Medicine

## 2020-11-20 VITALS — Wt 160.0 lb

## 2020-11-20 DIAGNOSIS — Z8619 Personal history of other infectious and parasitic diseases: Secondary | ICD-10-CM

## 2020-11-20 DIAGNOSIS — R0982 Postnasal drip: Secondary | ICD-10-CM

## 2020-11-20 NOTE — Progress Notes (Signed)
Virtual Visit via Video Note  I connected with Jamie Gamble on 11/20/20 at 10:00 AM EDT by a video enabled telemedicine application and verified that I am speaking with the correct person using two identifiers.  Location patient: home Location provider: work office Persons participating in the virtual visit: patient, provider  I discussed the limitations of evaluation and management by telemedicine and the availability of in person appointments. The patient expressed understanding and agreed to proceed.   HPI: She has scheduled this visit to discuss 2 issues:  1.  She is suffering from a lot of postnasal drip and wants to know what she can do.  She has no other URI symptoms.  2.  As discussed previously, she has recently become sexually active, she has a prior history of herpes and wants to know if there is anything she can do to decrease transmission.  She has not had an outbreak in at least 10 years.   ROS: Constitutional: Denies fever, chills, diaphoresis, appetite change and fatigue.  HEENT: Denies photophobia, eye pain, redness, hearing loss, ear pain, congestion, sore throat, rhinorrhea, sneezing, mouth sores, trouble swallowing, neck pain, neck stiffness and tinnitus.   Respiratory: Denies SOB, DOE, cough, chest tightness,  and wheezing.   Cardiovascular: Denies chest pain, palpitations and leg swelling.  Gastrointestinal: Denies nausea, vomiting, abdominal pain, diarrhea, constipation, blood in stool and abdominal distention.  Genitourinary: Denies dysuria, urgency, frequency, hematuria, flank pain and difficulty urinating.  Endocrine: Denies: hot or cold intolerance, sweats, changes in hair or nails, polyuria, polydipsia. Musculoskeletal: Denies myalgias, back pain, joint swelling, arthralgias and gait problem.  Skin: Denies pallor, rash and wound.  Neurological: Denies dizziness, seizures, syncope, weakness, light-headedness, numbness and headaches.  Hematological: Denies  adenopathy. Easy bruising, personal or family bleeding history  Psychiatric/Behavioral: Denies suicidal ideation, mood changes, confusion, nervousness, sleep disturbance and agitation   Past Medical History:  Diagnosis Date   Depression    GERD (gastroesophageal reflux disease)    Hyperlipidemia    Hypertension     No past surgical history on file.  Family History  Problem Relation Age of Onset   Depression Mother    Depression Brother     SOCIAL HX:   reports that she has quit smoking. She has never used smokeless tobacco. She reports that she does not currently use alcohol. She reports that she does not use drugs.   Current Outpatient Medications:    atorvastatin (LIPITOR) 40 MG tablet, Take 1 tablet (40 mg total) by mouth daily., Disp: 90 tablet, Rfl: 1   conjugated estrogens (PREMARIN) vaginal cream, Place 1 Applicatorful vaginally daily., Disp: 42.5 g, Rfl: 12   hydrochlorothiazide (MICROZIDE) 12.5 MG capsule, TAKE 1 CAPSULE(12.5 MG) BY MOUTH DAILY, Disp: 90 capsule, Rfl: 0   losartan (COZAAR) 25 MG tablet, TAKE 1 TABLET(25 MG) BY MOUTH DAILY, Disp: 90 tablet, Rfl: 0   OVER THE COUNTER MEDICATION, Vitamin D3 125 mg, Disp: , Rfl:    Probiotic Product (PROBIOTIC DAILY PO), Take 1 capsule by mouth 1 day or 1 dose., Disp: , Rfl:    QUEtiapine (SEROQUEL) 50 MG tablet, Take 25 mg by mouth daily., Disp: , Rfl:    traZODone (DESYREL) 150 MG tablet, Take 50 mg by mouth daily., Disp: , Rfl:   EXAM:   VITALS per patient if applicable: None reported  GENERAL: alert, oriented, appears well and in no acute distress  HEENT: atraumatic, conjunttiva clear, no obvious abnormalities on inspection of external nose and ears  NECK: normal movements of the head and neck  LUNGS: on inspection no signs of respiratory distress, breathing rate appears normal, no obvious gross increased work of breathing, gasping or wheezing  CV: no obvious cyanosis  MS: moves all visible extremities without  noticeable abnormality  PSYCH/NEURO: pleasant and cooperative, no obvious depression or anxiety, speech and thought processing grossly intact  ASSESSMENT AND PLAN:   History of herpes genitalis -Best thing to decrease herpes transmission is safe sexual practices including usage of condoms with all sexual intercourse. -As she has not had a herpes outbreak in over 10 years, possibly longer, I do not believe daily suppressive therapy is indicated.  Post-nasal drip -Have discussed use of a daily antihistamine.     I discussed the assessment and treatment plan with the patient. The patient was provided an opportunity to ask questions and all were answered. The patient agreed with the plan and demonstrated an understanding of the instructions.   The patient was advised to call back or seek an in-person evaluation if the symptoms worsen or if the condition fails to improve as anticipated.    [name], MD  [facility]

## 2020-11-25 ENCOUNTER — Encounter: Payer: Self-pay | Admitting: Internal Medicine

## 2020-12-04 ENCOUNTER — Encounter: Payer: Self-pay | Admitting: Internal Medicine

## 2020-12-06 ENCOUNTER — Ambulatory Visit: Payer: Medicare PPO | Admitting: Internal Medicine

## 2021-01-07 ENCOUNTER — Ambulatory Visit (INDEPENDENT_AMBULATORY_CARE_PROVIDER_SITE_OTHER): Payer: Medicare PPO

## 2021-01-07 DIAGNOSIS — E785 Hyperlipidemia, unspecified: Secondary | ICD-10-CM

## 2021-01-07 DIAGNOSIS — I10 Essential (primary) hypertension: Secondary | ICD-10-CM

## 2021-01-07 NOTE — Patient Instructions (Signed)
Visit Information  PATIENT GOALS:  Goals Addressed             This Visit's Progress    Lifestyle Change-Hypertension   On track    Timeframe:  Long-Range Goal Priority:  Medium Start Date:     06/18/20                        Expected End Date:     06/22/21                 Follow Up Date 04/08/21  - check blood pressure weekly - choose a place to take my blood pressure (home, clinic or office, retail store) - write blood pressure results in a log or diary - learn about high blood pressure  -increase activity as tolerated   Why is this important?   The changes that you are asked to make may be hard to do.  This is especially true when the changes are life-long.  Knowing why it is important to you is the first step.  Working on the change with your family or support person helps you not feel alone.  Reward yourself and family or support person when goals are met. This can be an activity you choose like bowling, hiking, biking, swimming or shooting hoops.     Notes:      Manage My Cholesterol   On track    Timeframe:  Long-Range Goal Priority:  Medium Start Date:   06/18/20                          Expected End Date:    06/22/21                  Follow Up Date 04/08/21   - change to whole grain breads, cereal, pasta - eat smaller or less servings of red meat - fill half the plate with nonstarchy vegetables - increase the amount of fiber in food - read food labels for fat and fiber - switch to low-fat or skim milk  -try to increase activity as tolerated Why is this important?   Changing cholesterol starts with eating heart-healthy foods.  Other steps may be to increase your activity and to quit if you smoke.    Notes:         Patient verbalizes understanding of instructions provided today and agrees to view in [place].   Telephone follow up appointment with care management team member scheduled for: 04/08/21 at 11:30 AM Peter Garter RN, Jackquline Denmark, CDE Care Management  Coordinator [facility] [phone], Mobile [phone]

## 2021-01-07 NOTE — Chronic Care Management (AMB) (Signed)
Chronic Care Management   CCM RN Visit Note  01/07/2021 Name: Jamie Gamble MRN: [id number] DOB: 11/26/1945  Subjective: Jamie Gamble is a 75 y.o. year old female who is a primary care patient of Isaac Bliss, Rayford Halsted, MD. The care management team was consulted for assistance with disease management and care coordination needs.    Engaged with patient by telephone for follow up visit in response to provider referral for case management and/or care coordination services.   Consent to Services:  The patient was given information about Chronic Care Management services, agreed to services, and gave verbal consent prior to initiation of services.  Please see initial visit note for detailed documentation.   Patient agreed to services and verbal consent obtained.   Assessment: Review of patient past medical history, allergies, medications, health status, including review of consultants reports, laboratory and other test data, was performed as part of comprehensive evaluation and provision of chronic care management services.   SDOH (Social Determinants of Health) assessments and interventions performed:    CCM Care Plan  Allergies  Allergen Reactions   Nexium [Esomeprazole] Diarrhea    Outpatient Encounter Medications as of 01/07/2021  Medication Sig   atorvastatin (LIPITOR) 40 MG tablet Take 1 tablet (40 mg total) by mouth daily.   conjugated estrogens (PREMARIN) vaginal cream Place 1 Applicatorful vaginally daily.   hydrochlorothiazide (MICROZIDE) 12.5 MG capsule TAKE 1 CAPSULE(12.5 MG) BY MOUTH DAILY   losartan (COZAAR) 25 MG tablet TAKE 1 TABLET(25 MG) BY MOUTH DAILY   OVER THE COUNTER MEDICATION Vitamin D3 125 mg   Probiotic Product (PROBIOTIC DAILY PO) Take 1 capsule by mouth 1 day or 1 dose.   QUEtiapine (SEROQUEL) 50 MG tablet Take 25 mg by mouth daily.   traZODone (DESYREL) 150 MG tablet Take 50 mg by mouth daily.   No facility-administered encounter medications on file as of  01/07/2021.    Patient Active Problem List   Diagnosis Date Noted   Depression    Hypertension    Hyperlipidemia    GERD (gastroesophageal reflux disease)     Conditions to be addressed/monitored:HTN and HLD  Care Plan : RNCM:Hypertension (Adult)  Updates made by Dimitri Ped, RN since 01/07/2021 12:00 AM     Problem: Lack of long term self-management of Hypertension   Priority: Medium     Long-Range Goal: Long term effective self-management of hypertension   Start Date: 06/18/2020  Expected End Date: 06/22/2021  This Visit's Progress: On track  Recent Progress: On track  Priority: Medium  Note:    Objective:  Last practice recorded BP readings:  BP Readings from Last 3 Encounters:  06/05/20 120/80  11/01/19 124/80   Most recent eGFR/CrCl: No results found for: EGFR  No components found for: CRCL Current Barriers:  Knowledge Deficits related to basic understanding of hypertension pathophysiology and self care management Knowledge Deficits related to understanding of medications prescribed for management of hypertension Does not adhere to provider recommendations re:  Reports she has not been checking her B/P at home but it is good when it has been checked at provider visits.  Reports she is continuing to try to eat healthy and less salt.  States she continues to walk on the treadmill daily for 60 minutes and she is doing weight training also.  States she has lost weight from eating healthier but does not know how much weight.  States she has gone down several sizes in her clothes Case Manager Clinical Goal(s):  patient will  verbalize understanding of plan for hypertension management patient will attend all scheduled medical appointments: Dr. Jerilee Hoh 01/09/21 patient will demonstrate improved health management independence as evidenced by checking blood pressure as directed and notifying PCP if SBP>140 or DBP > 90, taking all medications as prescribe, and adhering to a  low sodium diet as discussed. Interventions:  Collaboration with Isaac Bliss, Rayford Halsted, MD regarding development and update of comprehensive plan of care as evidenced by provider attestation and co-signature Inter-disciplinary care team collaboration (see longitudinal plan of care) Evaluation of current treatment plan related to hypertension self management and patient's adherence to plan as established by provider. Reinforced education to patient re: stroke prevention, s/s of heart attack and stroke, DASH diet, complications of uncontrolled blood pressure Reviewed plans with patient for ongoing care management follow up and provided patient with direct contact information for care management team Reinforced to monitor blood pressure 1-2 times a week and record, calling PCP for findings outside established parameters.  Reviewed scheduled/upcoming provider appointments including: Dr.Hernandez 01/09/21 Reinforced importance of regular exercise to help keep B/P in control and reinforced to try doing some type of strength/resistance training twice a week Reinforced to continue to slowly lose weight and to continue to eat healthy Self-Care Activities: - Self administers medications as prescribed Attends all scheduled provider appointments Calls provider office for new concerns, questions, or BP outside discussed parameters Checks BP and records as discussed Follows a low sodium diet/DASH diet Patient Goals: - check blood pressure weekly - choose a place to take my blood pressure (home, clinic or office, retail store) - write blood pressure results in a log or diary - learn about high blood pressure -increase activity as tolerated Follow Up Plan: Telephone follow up appointment with care management team member scheduled for: 04/08/20 at 11:30 AM The patient has been provided with contact information for the care management team and has been advised to call with any health related questions or  concerns.      Care Plan : RNCM: Hyperlipidemia  Updates made by Dimitri Ped, RN since 01/07/2021 12:00 AM     Problem: Lack of effective self management of hyperlipidemia   Priority: Medium     Long-Range Goal: Effective self management of hyperlipidemia   Start Date: 06/18/2020  Expected End Date: 06/22/2021  This Visit's Progress: On track  Recent Progress: On track  Priority: Medium  Note:   Current Barriers:  Varied  controlled hyperlipidemia, complicated by hx of HTN and depression Current antihyperlipidemic regimen: Atorvastatin  Most recent lipid panel:     Component Value Date/Time   CHOL 166 06/05/2020 1050   TRIG 77.0 06/05/2020 1050   HDL 75.80 06/05/2020 1050   CHOLHDL 2 06/05/2020 1050   VLDL 15.4 06/05/2020 1050   [place] 75 06/05/2020 1050   ASCVD risk enhancing conditions: age >48,  HTN Does not adhere to provider recommendations re:  Reports she continues to walk on the treadmill daily for about 60 minutes and doing weight training  Reports she has been trying to eat healthy and is not eating later at night. States she is taking a probiotic and it has helped with her bowels.   RN Care Manager Clinical Goal(s):  patient will work with RN Care Manager, providers, and care team towards execution of optimized self-health management plan patient will verbalize understanding of plan for hyperlipidemia patient will meet with RN Care Manager to address educational needs for self management of hyperlipidemia patient will attend all scheduled medical  appointments: Dr. Jerilee Hoh  01/09/21 patient will verbalize basic understanding of hyperlipidemia disease process and self health management plan as evidenced by adherence to medications,diet, exercise and follow up with provider the patient will work with the care management team towards completion of advanced directives Interventions: Collaboration with Isaac Bliss, Rayford Halsted, MD regarding development and  update of comprehensive plan of care as evidenced by provider attestation and co-signature Inter-disciplinary care team collaboration (see longitudinal plan of care) Medication review performed; medication list updated in electronic medical record.  Evaluation of current treatment plan related to hyperlipidemia and patient's adherence to plan as established by provider Reinforced education on hyperlipidemia  Reviewed medications with patient and reinforced statin use Reviewed scheduled/upcoming provider appointments including: Dr. Jerilee Hoh 01/09/21 Discussed plans with patient for ongoing care management follow up and provided patient with direct contact information for care management team Reinforced  on importance of regular exercise for lipid management and encouraged to try doing strength/resistance training twice a week Reinforced to continue her daily exercise and eating healther Patient Goals/Self-Care Activities: - call for medicine refill 2 or 3 days before it runs out - change to whole grain breads, cereal, pasta - drink 6 to 8 glasses of water each day - eat 3 to 5 servings of fruits and vegetables each day - fill half the plate with nonstarchy vegetables - read food labels for fat, fiber, carbohydrates and portion size - eat smaller or less servings of red meat - fill half the plate with nonstarchy vegetables - increase the amount of fiber in food - read food labels for fat and fiber - switch to low-fat or skim milk -increase activity as tolerated Follow Up Plan: Telephone follow up appointment with care management team member scheduled for: 04/08/21 at 11:30 AM The patient has been provided with contact information for the care management team and has been advised to call with any health related questions or concerns.         Plan:Telephone follow up appointment with care management team member scheduled for:  04/08/21 The patient has been provided with contact information  for the care management team and has been advised to call with any health related questions or concerns.  Peter Garter RN, Jackquline Denmark, CDE Care Management Coordinator [facility] [phone], Mobile [phone]

## 2021-01-09 ENCOUNTER — Other Ambulatory Visit: Payer: Self-pay

## 2021-01-09 ENCOUNTER — Encounter: Payer: Self-pay | Admitting: Internal Medicine

## 2021-01-09 ENCOUNTER — Ambulatory Visit: Payer: Medicare PPO | Admitting: Internal Medicine

## 2021-01-09 VITALS — BP 110/80 | HR 79 | Temp 98.5°F | Wt 152.7 lb

## 2021-01-09 DIAGNOSIS — I10 Essential (primary) hypertension: Secondary | ICD-10-CM

## 2021-01-09 DIAGNOSIS — K219 Gastro-esophageal reflux disease without esophagitis: Secondary | ICD-10-CM | POA: Diagnosis not present

## 2021-01-09 DIAGNOSIS — E785 Hyperlipidemia, unspecified: Secondary | ICD-10-CM

## 2021-01-09 DIAGNOSIS — F332 Major depressive disorder, recurrent severe without psychotic features: Secondary | ICD-10-CM

## 2021-01-09 NOTE — Progress Notes (Signed)
Established Patient Office Visit     This visit occurred during the SARS-CoV-2 public health emergency.  Safety protocols were in place, including screening questions prior to the visit, additional usage of staff PPE, and extensive cleaning of exam room while observing appropriate contact time as indicated for disinfecting solutions.    CC/Reason for Visit: 72-month follow-up chronic medical conditions  HPI: Jourden Delmont is a 75 y.o. female who is coming in today for the above mentioned reasons. Past Medical History is significant for: Hypertension, hyperlipidemia, GERD, depression.  She is doing well and has no acute concerns today.  She had her flu vaccine and new COVID booster already.  With the aid of her psychiatrist she has been coming off her trazodone and Seroquel.  She is currently taking half of her prescribed doses.  All cancer screening is up-to-date.   Past Medical/Surgical History: Past Medical History:  Diagnosis Date   Depression    GERD (gastroesophageal reflux disease)    Hyperlipidemia    Hypertension     No past surgical history on file.  Social History:  reports that she has quit smoking. She has never used smokeless tobacco. She reports that she does not currently use alcohol. She reports that she does not use drugs.  Allergies: Allergies  Allergen Reactions   Nexium [Esomeprazole] Diarrhea    Family History:  Family History  Problem Relation Age of Onset   Depression Mother    Depression Brother      Current Outpatient Medications:    atorvastatin (LIPITOR) 40 MG tablet, Take 1 tablet (40 mg total) by mouth daily., Disp: 90 tablet, Rfl: 1   conjugated estrogens (PREMARIN) vaginal cream, Place 1 Applicatorful vaginally daily., Disp: 42.5 g, Rfl: 12   hydrochlorothiazide (MICROZIDE) 12.5 MG capsule, TAKE 1 CAPSULE(12.5 MG) BY MOUTH DAILY, Disp: 90 capsule, Rfl: 0   losartan (COZAAR) 25 MG tablet, TAKE 1 TABLET(25 MG) BY MOUTH DAILY, Disp: 90  tablet, Rfl: 0   OVER THE COUNTER MEDICATION, Vitamin D3 125 mg, Disp: , Rfl:    Probiotic Product (PROBIOTIC DAILY PO), Take 1 capsule by mouth 1 day or 1 dose., Disp: , Rfl:    QUEtiapine (SEROQUEL) 50 MG tablet, Take 25 mg by mouth daily., Disp: , Rfl:    traZODone (DESYREL) 150 MG tablet, Take 25 mg by mouth daily., Disp: , Rfl:   Review of Systems:  Constitutional: Denies fever, chills, diaphoresis, appetite change and fatigue.  HEENT: Denies photophobia, eye pain, redness, hearing loss, ear pain, congestion, sore throat, rhinorrhea, sneezing, mouth sores, trouble swallowing, neck pain, neck stiffness and tinnitus.   Respiratory: Denies SOB, DOE, cough, chest tightness,  and wheezing.   Cardiovascular: Denies chest pain, palpitations and leg swelling.  Gastrointestinal: Denies nausea, vomiting, abdominal pain, diarrhea, constipation, blood in stool and abdominal distention.  Genitourinary: Denies dysuria, urgency, frequency, hematuria, flank pain and difficulty urinating.  Endocrine: Denies: hot or cold intolerance, sweats, changes in hair or nails, polyuria, polydipsia. Musculoskeletal: Denies myalgias, back pain, joint swelling, arthralgias and gait problem.  Skin: Denies pallor, rash and wound.  Neurological: Denies dizziness, seizures, syncope, weakness, light-headedness, numbness and headaches.  Hematological: Denies adenopathy. Easy bruising, personal or family bleeding history  Psychiatric/Behavioral: Denies suicidal ideation, mood changes, confusion, nervousness, sleep disturbance and agitation    Physical Exam: Vitals:   01/09/21 0938  BP: 110/80  Pulse: 79  Temp: 98.5 F (36.9 C)  TempSrc: Oral  SpO2: 99%  Weight: 152 lb 11.2 oz (  69.3 kg)    Body mass index is 26.63 kg/m.   Constitutional: NAD, calm, comfortable Eyes: PERRL, lids and conjunctivae normal ENMT: Mucous membranes are moist.  Respiratory: clear to auscultation bilaterally, no wheezing, no crackles.  Normal respiratory effort. No accessory muscle use.  Cardiovascular: Regular rate and rhythm, no murmurs / rubs / gallops. No extremity edema.  Neurologic: Grossly intact and nonfocal Psychiatric: Normal judgment and insight. Alert and oriented x 3. Normal mood.    Impression and Plan:  Hyperlipidemia, unspecified hyperlipidemia type -She is currently on atorvastatin 40 mg daily.  Last lipid panel in March with a total cholesterol of 166, triglycerides 77 and LDL 75.  Primary hypertension -Well-controlled.  Gastroesophageal reflux disease without esophagitis -Stable.  Severe episode of recurrent major depressive disorder, without psychotic features (HCC) -Mental health is currently stable, she is followed by psychiatry who is aiding her on reducing doses of her medications.  Time spent: 30 minutes reviewing chart, interviewing and examining patient and formulating plan of care.     [name], MD [facility] at [facility]

## 2021-01-15 DIAGNOSIS — H04123 Dry eye syndrome of bilateral lacrimal glands: Secondary | ICD-10-CM | POA: Diagnosis not present

## 2021-01-15 DIAGNOSIS — H2513 Age-related nuclear cataract, bilateral: Secondary | ICD-10-CM | POA: Diagnosis not present

## 2021-01-15 DIAGNOSIS — H5213 Myopia, bilateral: Secondary | ICD-10-CM | POA: Diagnosis not present

## 2021-01-21 DIAGNOSIS — E785 Hyperlipidemia, unspecified: Secondary | ICD-10-CM

## 2021-01-21 DIAGNOSIS — I10 Essential (primary) hypertension: Secondary | ICD-10-CM | POA: Diagnosis not present

## 2021-02-20 ENCOUNTER — Encounter: Payer: Self-pay | Admitting: Internal Medicine

## 2021-02-27 ENCOUNTER — Ambulatory Visit: Payer: Medicare PPO | Admitting: Internal Medicine

## 2021-02-27 VITALS — BP 130/80 | HR 76 | Temp 98.0°F | Wt 156.6 lb

## 2021-02-27 DIAGNOSIS — Z01818 Encounter for other preprocedural examination: Secondary | ICD-10-CM

## 2021-02-27 LAB — CBC WITH DIFFERENTIAL/PLATELET
Basophils Absolute: 0.1 10*3/uL (ref 0.0–0.1)
Basophils Relative: 0.9 % (ref 0.0–3.0)
Eosinophils Absolute: 0.5 10*3/uL (ref 0.0–0.7)
Eosinophils Relative: 8.5 % — ABNORMAL HIGH (ref 0.0–5.0)
HCT: 38.1 % (ref 36.0–46.0)
Hemoglobin: 12.8 g/dL (ref 12.0–15.0)
Lymphocytes Relative: 18.4 % (ref 12.0–46.0)
Lymphs Abs: 1 10*3/uL (ref 0.7–4.0)
MCHC: 33.5 g/dL (ref 30.0–36.0)
MCV: 94.6 fl (ref 78.0–100.0)
Monocytes Absolute: 0.4 10*3/uL (ref 0.1–1.0)
Monocytes Relative: 7.9 % (ref 3.0–12.0)
Neutro Abs: 3.6 10*3/uL (ref 1.4–7.7)
Neutrophils Relative %: 64.3 % (ref 43.0–77.0)
Platelets: 278 10*3/uL (ref 150.0–400.0)
RBC: 4.03 Mil/uL (ref 3.87–5.11)
RDW: 14.2 % (ref 11.5–15.5)
WBC: 5.6 10*3/uL (ref 4.0–10.5)

## 2021-02-27 LAB — COMPREHENSIVE METABOLIC PANEL
ALT: 29 U/L (ref 0–35)
AST: 36 U/L (ref 0–37)
Albumin: 3.9 g/dL (ref 3.5–5.2)
Alkaline Phosphatase: 54 U/L (ref 39–117)
BUN: 14 mg/dL (ref 6–23)
CO2: 31 mEq/L (ref 19–32)
Calcium: 9.7 mg/dL (ref 8.4–10.5)
Chloride: 103 mEq/L (ref 96–112)
Creatinine, Ser: 0.7 mg/dL (ref 0.40–1.20)
GFR: 85.35 mL/min (ref >60.00)
Glucose, Bld: 90 mg/dL (ref 70–99)
Potassium: 4.5 mEq/L (ref 3.5–5.1)
Sodium: 138 mEq/L (ref 135–145)
Total Bilirubin: 0.4 mg/dL (ref 0.2–1.2)
Total Protein: 6.8 g/dL (ref 6.0–8.3)

## 2021-02-27 NOTE — Progress Notes (Signed)
Established Patient Office Visit     This visit occurred during the SARS-CoV-2 public health emergency.  Safety protocols were in place, including screening questions prior to the visit, additional usage of staff PPE, and extensive cleaning of exam room while observing appropriate contact time as indicated for disinfecting solutions.    CC/Reason for Visit: Preop labs  HPI: Nikiah Goin is a 75 y.o. female who is coming in today for the above mentioned reasons.  She is apparently having laser resurfacing done with a plastic surgeon out of [facility].  I have not received any preop clearance paperwork for her.  However she has scheduled this visit stating that she needs a CBC, CMP and EKG done.  She is feeling well and has no concerns.  Past Medical/Surgical History: Past Medical History:  Diagnosis Date   Depression    GERD (gastroesophageal reflux disease)    Hyperlipidemia    Hypertension     No past surgical history on file.  Social History:  reports that she has quit smoking. She has never used smokeless tobacco. She reports that she does not currently use alcohol. She reports that she does not use drugs.  Allergies: Allergies  Allergen Reactions   Nexium [Esomeprazole] Diarrhea    Family History:  Family History  Problem Relation Age of Onset   Depression Mother    Depression Brother      Current Outpatient Medications:    atorvastatin (LIPITOR) 40 MG tablet, Take 1 tablet (40 mg total) by mouth daily., Disp: 90 tablet, Rfl: 1   Biotin 10 MG TABS, Take by mouth., Disp: , Rfl:    hydrochlorothiazide (MICROZIDE) 12.5 MG capsule, TAKE 1 CAPSULE(12.5 MG) BY MOUTH DAILY, Disp: 90 capsule, Rfl: 0   losartan (COZAAR) 25 MG tablet, TAKE 1 TABLET(25 MG) BY MOUTH DAILY, Disp: 90 tablet, Rfl: 0   Multiple Vitamins-Minerals (OCUVITE ADULT 50+ PO), Take by mouth., Disp: , Rfl:    OVER THE COUNTER MEDICATION, Vitamin D3 125 mg, Disp: , Rfl:    OVER THE COUNTER MEDICATION,  Vitamin K2 + D3, Disp: , Rfl:    Probiotic Product (PROBIOTIC DAILY PO), Take 1 capsule by mouth 1 day or 1 dose., Disp: , Rfl:    QUEtiapine (SEROQUEL) 50 MG tablet, Take 25 mg by mouth daily., Disp: , Rfl:    traZODone (DESYREL) 150 MG tablet, Take 25 mg by mouth daily. Half tab daily, Disp: , Rfl:    vitamin B-12 (CYANOCOBALAMIN) 500 MCG tablet, Take 500 mcg by mouth daily., Disp: , Rfl:   Review of Systems:  Constitutional: Denies fever, chills, diaphoresis, appetite change and fatigue.  HEENT: Denies photophobia, eye pain, redness, hearing loss, ear pain, congestion, sore throat, rhinorrhea, sneezing, mouth sores, trouble swallowing, neck pain, neck stiffness and tinnitus.   Respiratory: Denies SOB, DOE, cough, chest tightness,  and wheezing.   Cardiovascular: Denies chest pain, palpitations and leg swelling.  Gastrointestinal: Denies nausea, vomiting, abdominal pain, diarrhea, constipation, blood in stool and abdominal distention.  Genitourinary: Denies dysuria, urgency, frequency, hematuria, flank pain and difficulty urinating.  Endocrine: Denies: hot or cold intolerance, sweats, changes in hair or nails, polyuria, polydipsia. Musculoskeletal: Denies myalgias, back pain, joint swelling, arthralgias and gait problem.  Skin: Denies pallor, rash and wound.  Neurological: Denies dizziness, seizures, syncope, weakness, light-headedness, numbness and headaches.  Hematological: Denies adenopathy. Easy bruising, personal or family bleeding history  Psychiatric/Behavioral: Denies suicidal ideation, mood changes, confusion, nervousness, sleep disturbance and agitation    Physical  Exam: Vitals:   02/27/21 0935  BP: 130/80  Pulse: 76  Temp: 98 F (36.7 C)  TempSrc: Oral  SpO2: 97%  Weight: 156 lb 9.6 oz (71 kg)    Body mass index is 27.31 kg/m.   Constitutional: NAD, calm, comfortable Eyes: PERRL, lids and conjunctivae normal ENMT: Mucous membranes are moist.  Respiratory: clear  to auscultation bilaterally, no wheezing, no crackles. Normal respiratory effort. No accessory muscle use.  Cardiovascular: Regular rate and rhythm, no murmurs / rubs / gallops. No extremity edema.  Neurologic: Grossly intact and nonfocal Psychiatric: Normal judgment and insight. Alert and oriented x 3. Normal mood.    Impression and Plan:  Pre-op examination  - Plan: CBC with Differential/Platelet, Comprehensive metabolic panel, EKG [id number] -EKG done in office and interpreted by myself as normal sinus rhythm at a rate of around 72, normal axis, there is an intraventricular conduction defect but no acute ST or T wave changes. -Labs will be ordered per request.  Time spent: 22 minutes reviewing chart, interviewing and examining patient and formulating plan of care.    Lelon Frohlich, MD [facility] at [facility]

## 2021-02-28 ENCOUNTER — Encounter: Payer: Self-pay | Admitting: Internal Medicine

## 2021-03-02 ENCOUNTER — Other Ambulatory Visit: Payer: Self-pay | Admitting: Internal Medicine

## 2021-03-04 MED ORDER — LOSARTAN POTASSIUM 25 MG PO TABS: 25.0000 mg | ORAL_TABLET | Freq: Every day | ORAL | 0 refills | Status: DC

## 2021-03-04 MED ORDER — HYDROCHLOROTHIAZIDE 12.5 MG PO CAPS: 12.5000 mg | ORAL_CAPSULE | Freq: Every day | ORAL | 0 refills | Status: DC

## 2021-03-26 DIAGNOSIS — L570 Actinic keratosis: Secondary | ICD-10-CM | POA: Diagnosis not present

## 2021-03-26 DIAGNOSIS — Z23 Encounter for immunization: Secondary | ICD-10-CM | POA: Diagnosis not present

## 2021-04-04 ENCOUNTER — Other Ambulatory Visit: Payer: Self-pay | Admitting: Internal Medicine

## 2021-04-04 ENCOUNTER — Ambulatory Visit: Payer: Medicare PPO | Admitting: Internal Medicine

## 2021-04-04 ENCOUNTER — Encounter: Payer: Self-pay | Admitting: Internal Medicine

## 2021-04-04 ENCOUNTER — Telehealth: Payer: Medicare PPO | Admitting: Internal Medicine

## 2021-04-04 VITALS — BP 120/80 | HR 78 | Temp 97.6°F | Ht 63.5 in | Wt 155.6 lb

## 2021-04-04 DIAGNOSIS — K649 Unspecified hemorrhoids: Secondary | ICD-10-CM | POA: Diagnosis not present

## 2021-04-04 MED ORDER — HYDROCORTISONE (PERIANAL) 2.5 % EX CREA: 1.0000 "application " | TOPICAL_CREAM | Freq: Two times a day (BID) | CUTANEOUS | 2 refills | Status: DC

## 2021-04-04 NOTE — Progress Notes (Signed)
Acute office Visit     This visit occurred during the SARS-CoV-2 public health emergency.  Safety protocols were in place, including screening questions prior to the visit, additional usage of staff PPE, and extensive cleaning of exam room while observing appropriate contact time as indicated for disinfecting solutions.    CC/Reason for Visit: Hemorrhoid  HPI: Jamie Gamble is a 76 y.o. female who is coming in today for the above mentioned reasons.  She noticed a hemorrhoid 2 days ago.  It is nonpainful and nonbleeding.  She has not been constipated or having hard stools, in fact her stools have been on the looser side.  She wonders what she can do to treat this.  Past Medical/Surgical History: Past Medical History:  Diagnosis Date   Depression    GERD (gastroesophageal reflux disease)    Hyperlipidemia    Hypertension     History reviewed. No pertinent surgical history.  Social History:  reports that she has quit smoking. She has never used smokeless tobacco. She reports that she does not currently use alcohol. She reports that she does not use drugs.  Allergies: Allergies  Allergen Reactions   Nexium [Esomeprazole] Diarrhea    Family History:  Family History  Problem Relation Age of Onset   Depression Mother    Depression Brother      Current Outpatient Medications:    atorvastatin (LIPITOR) 40 MG tablet, TAKE 1 TABLET(40 MG) BY MOUTH DAILY, Disp: 90 tablet, Rfl: 1   Biotin 10 MG TABS, Take by mouth., Disp: , Rfl:    hydrochlorothiazide (MICROZIDE) 12.5 MG capsule, Take 1 capsule (12.5 mg total) by mouth daily. TAKE 1 CAPSULE(12.5 MG) BY MOUTH DAILY, Disp: 90 capsule, Rfl: 0   hydrocortisone (ANUSOL-HC) 2.5 % rectal cream, Place 1 application rectally 2 (two) times daily., Disp: 30 g, Rfl: 2   losartan (COZAAR) 25 MG tablet, Take 1 tablet (25 mg total) by mouth daily., Disp: 90 tablet, Rfl: 0   Multiple Vitamins-Minerals (OCUVITE ADULT 50+ PO), Take by mouth.,  Disp: , Rfl:    OVER THE COUNTER MEDICATION, Vitamin D3 125 mg, Disp: , Rfl:    OVER THE COUNTER MEDICATION, Vitamin K2 + D3, Disp: , Rfl:    Probiotic Product (PROBIOTIC DAILY PO), Take 1 capsule by mouth 1 day or 1 dose., Disp: , Rfl:    QUEtiapine (SEROQUEL) 50 MG tablet, Take 25 mg by mouth daily., Disp: , Rfl:    traZODone (DESYREL) 150 MG tablet, Take 25 mg by mouth daily. Half tab daily, Disp: , Rfl:    vitamin B-12 (CYANOCOBALAMIN) 500 MCG tablet, Take 500 mcg by mouth daily., Disp: , Rfl:   Review of Systems:  Constitutional: Denies fever, chills, diaphoresis, appetite change and fatigue.  HEENT: Denies photophobia, eye pain, redness, hearing loss, ear pain, congestion, sore throat, rhinorrhea, sneezing, mouth sores, trouble swallowing, neck pain, neck stiffness and tinnitus.   Respiratory: Denies SOB, DOE, cough, chest tightness,  and wheezing.   Cardiovascular: Denies chest pain, palpitations and leg swelling.  Gastrointestinal: Denies nausea, vomiting, abdominal pain, diarrhea, constipation, blood in stool and abdominal distention.  Genitourinary: Denies dysuria, urgency, frequency, hematuria, flank pain and difficulty urinating.  Endocrine: Denies: hot or cold intolerance, sweats, changes in hair or nails, polyuria, polydipsia. Musculoskeletal: Denies myalgias, back pain, joint swelling, arthralgias and gait problem.  Skin: Denies pallor, rash and wound.  Neurological: Denies dizziness, seizures, syncope, weakness, light-headedness, numbness and headaches.  Hematological: Denies adenopathy. Easy bruising, personal or  family bleeding history  Psychiatric/Behavioral: Denies suicidal ideation, mood changes, confusion, nervousness, sleep disturbance and agitation    Physical Exam: Vitals:   04/04/21 1242  BP: 120/80  Pulse: 78  Temp: 97.6 F (36.4 C)  TempSrc: Oral  SpO2: 98%  Weight: 155 lb 9 oz (70.6 kg)  Height: 5' 3.5" (1.613 m)    Body mass index is 27.12  kg/m.   Constitutional: NAD, calm, comfortable Eyes: PERRL, lids and conjunctivae normal ENMT: Mucous membranes are moist.  Neurologic: Grossly intact and nonfocal Psychiatric: Normal judgment and insight. Alert and oriented x 3. Normal mood.    Impression and Plan:  Hemorrhoids, unspecified hemorrhoid type  - Plan: hydrocortisone (ANUSOL-HC) 2.5 % rectal cream  Time spent: 20 minutes reviewing chart, interviewing and examining patient and formulating plan of care.     Lelon Frohlich, MD [facility] at [facility]

## 2021-04-07 ENCOUNTER — Encounter: Payer: Self-pay | Admitting: Internal Medicine

## 2021-04-08 ENCOUNTER — Telehealth: Payer: Medicare PPO | Admitting: Internal Medicine

## 2021-04-08 ENCOUNTER — Ambulatory Visit (INDEPENDENT_AMBULATORY_CARE_PROVIDER_SITE_OTHER): Payer: Medicare PPO

## 2021-04-08 DIAGNOSIS — E785 Hyperlipidemia, unspecified: Secondary | ICD-10-CM

## 2021-04-08 DIAGNOSIS — I10 Essential (primary) hypertension: Secondary | ICD-10-CM

## 2021-04-08 NOTE — Telephone Encounter (Signed)
Spoke to pt and she stated she has everything straightened out no. No further actions needed.

## 2021-04-08 NOTE — Patient Instructions (Signed)
Visit Information  Thank you for taking time to visit with me today. Please don't hesitate to contact me if I can be of assistance to you before our next scheduled telephone appointment.  Following are the goals we discussed today:  Take all medications as prescribed Attend all scheduled provider appointments Call pharmacy for medication refills 3-7 days in advance of running out of medications Perform all self care activities independently  Perform IADL's (shopping, preparing meals, housekeeping, managing finances) independently Call provider office for new concerns or questions  check blood pressure weekly choose a place to take my blood pressure (home, clinic or office, retail store) keep a blood pressure log take blood pressure log to all doctor appointments call doctor for signs and symptoms of high blood pressure keep all doctor appointments eat more whole grains, fruits and vegetables, lean meats and healthy fats limit salt intake to 2300mg /day call for medicine refill 2 or 3 days before it runs out take all medications exactly as prescribed call doctor with any symptoms you believe are related to your medicine  Our next appointment is by telephone on 10/07/21 at 11:30 AM  Please call the care guide team at [phone] if you need to cancel or reschedule your appointment.   If you are experiencing a Mental Health or Behavioral Health Crisis or need someone to talk to, please call the Suicide and Crisis Lifeline: 988 call the 03-30-1986 National Suicide Prevention Lifeline: (903) 848-2996 or TTY: 360-215-2522 TTY 6412075151) to talk to a trained counselor call 1-800-273-TALK (toll free, 24 hour hotline) go to [facility] [address], [place] [id number]) call 911   Patient verbalizes understanding of instructions and care plan provided today and agrees to view in MyChart. Active MyChart status confirmed with patient.   ([phone]  RN, Dudley Major, CDE Care Management Coordinator [facility] [phone], Mobile [phone]

## 2021-04-08 NOTE — Chronic Care Management (AMB) (Signed)
Chronic Care Management   CCM RN Visit Note  04/08/2021 Name: Jamie Gamble MRN: [id number] DOB: 10-11-1945  Subjective: Jamie Gamble is a 76 y.o. year old female who is a primary care patient of Isaac Bliss, Rayford Halsted, MD. The care management team was consulted for assistance with disease management and care coordination needs.    Engaged with patient by telephone for follow up visit in response to provider referral for case management and/or care coordination services.   Consent to Services:  The patient was given information about Chronic Care Management services, agreed to services, and gave verbal consent prior to initiation of services.  Please see initial visit note for detailed documentation.   Patient agreed to services and verbal consent obtained.   Assessment: Review of patient past medical history, allergies, medications, health status, including review of consultants reports, laboratory and other test data, was performed as part of comprehensive evaluation and provision of chronic care management services.   SDOH (Social Determinants of Health) assessments and interventions performed:    CCM Care Plan  Allergies  Allergen Reactions   Nexium [Esomeprazole] Diarrhea    Outpatient Encounter Medications as of 04/08/2021  Medication Sig   atorvastatin (LIPITOR) 40 MG tablet TAKE 1 TABLET(40 MG) BY MOUTH DAILY   Biotin 10 MG TABS Take by mouth.   hydrochlorothiazide (MICROZIDE) 12.5 MG capsule Take 1 capsule (12.5 mg total) by mouth daily. TAKE 1 CAPSULE(12.5 MG) BY MOUTH DAILY   hydrocortisone (ANUSOL-HC) 2.5 % rectal cream Place 1 application rectally 2 (two) times daily.   losartan (COZAAR) 25 MG tablet Take 1 tablet (25 mg total) by mouth daily.   Multiple Vitamins-Minerals (OCUVITE ADULT 50+ PO) Take by mouth.   OVER THE COUNTER MEDICATION Vitamin D3 125 mg   OVER THE COUNTER MEDICATION Vitamin K2 + D3   Probiotic Product (PROBIOTIC DAILY PO) Take 1 capsule by mouth 1  day or 1 dose.   QUEtiapine (SEROQUEL) 50 MG tablet Take 25 mg by mouth daily.   traZODone (DESYREL) 150 MG tablet Take 25 mg by mouth daily. Half tab daily   vitamin B-12 (CYANOCOBALAMIN) 500 MCG tablet Take 500 mcg by mouth daily.   No facility-administered encounter medications on file as of 04/08/2021.    Patient Active Problem List   Diagnosis Date Noted   Depression    Hypertension    Hyperlipidemia    GERD (gastroesophageal reflux disease)     Conditions to be addressed/monitored:HTN and HLD  Care Plan : RNCM:Hypertension (Adult)  Updates made by Dimitri Ped, RN since 04/08/2021 12:00 AM     Problem: Lack of long term self-management of Hypertension   Priority: Medium     Long-Range Goal: Long term effective self-management of hypertension   Start Date: 06/18/2020  Expected End Date: 06/22/2021  Recent Progress: On track  Priority: Medium  Note:    Objective:  Last practice recorded BP readings:  BP Readings from Last 3 Encounters:  06/05/20 120/80  11/01/19 124/80   Most recent eGFR/CrCl: No results found for: EGFR  No components found for: CRCL Current Barriers:  Knowledge Deficits related to basic understanding of hypertension pathophysiology and self care management Knowledge Deficits related to understanding of medications prescribed for management of hypertension Does not adhere to provider recommendations re:  Reports she has not been checking her B/P at home but it is good when it has been checked at provider visits.  Reports she is continuing to try to eat healthy and less salt.  States she continues to walk on the treadmill daily for 60 minutes and she is doing weight training also.  States she has lost weight from eating healthier but does not know how much weight.  States she has gone down several sizes in her clothes Case Manager Clinical Goal(s):  patient will verbalize understanding of plan for hypertension management patient will attend all  scheduled medical appointments: Dr. Jerilee Hoh 01/09/21 patient will demonstrate improved health management independence as evidenced by checking blood pressure as directed and notifying PCP if SBP>140 or DBP > 90, taking all medications as prescribe, and adhering to a low sodium diet as discussed. Interventions:  Collaboration with Isaac Bliss, Rayford Halsted, MD regarding development and update of comprehensive plan of care as evidenced by provider attestation and co-signature Inter-disciplinary care team collaboration (see longitudinal plan of care) Evaluation of current treatment plan related to hypertension self management and patient's adherence to plan as established by provider. Reinforced education to patient re: stroke prevention, s/s of heart attack and stroke, DASH diet, complications of uncontrolled blood pressure Reviewed plans with patient for ongoing care management follow up and provided patient with direct contact information for care management team Reinforced to monitor blood pressure 1-2 times a week and record, calling PCP for findings outside established parameters.  Reviewed scheduled/upcoming provider appointments including: Dr.Hernandez 01/09/21 Reinforced importance of regular exercise to help keep B/P in control and reinforced to try doing some type of strength/resistance training twice a week Reinforced to continue to slowly lose weight and to continue to eat healthy Self-Care Activities: - Self administers medications as prescribed Attends all scheduled provider appointments Calls provider office for new concerns, questions, or BP outside discussed parameters Checks BP and records as discussed Follows a low sodium diet/DASH diet Patient Goals: - check blood pressure weekly - choose a place to take my blood pressure (home, clinic or office, retail store) - write blood pressure results in a log or diary - learn about high blood pressure -increase activity as  tolerated Follow Up Plan: Telephone follow up appointment with care management team member scheduled for: 04/08/20 at 11:30 AM The patient has been provided with contact information for the care management team and has been advised to call with any health related questions or concerns.      Care Plan : RN Care Manager Plan of Care  Updates made by Dimitri Ped, RN since 04/08/2021 12:00 AM     Problem: Chronic Disease Management and Care Coordination Needs (HTN and HLD)   Priority: High     Long-Range Goal: Establish Plan of Care for Chronic Disease Management Needs (HTN and HLD)   Start Date: 04/08/2021  Expected End Date: 04/05/2022  Priority: High  Note:   Current Barriers:  Chronic Disease Management support and education needs related to HTN and HLD  Reports she has not been checking her B/P at home but it is good when it has been checked at provider visits. States her B/P was 120/80 at last weeks visit  Reports she is continuing to try to eat healthy and less salt.  States she continues to walk on the treadmill daily for 60 minutes and she is doing weight training also. States she is trying to eat health and to continue to lose weight. RNCM Clinical Goal(s):  Patient will verbalize understanding of plan for management of HTN and HLD as evidenced by voiced adherence to plan of care verbalize basic understanding of  HTN and HLD disease process and self  health management plan as evidenced by voiced understanding and teach back take all medications exactly as prescribed and will call provider for medication related questions as evidenced by dispense report and pt verbalization attend all scheduled medical appointments: Dr. Jerilee Hoh as evidenced by medical records demonstrate Ongoing adherence to prescribed treatment plan for HTN and HLD as evidenced by readings within limits, voiced adherence to plan of care continue to work with RN Care Manager to address care management and care  coordination needs related to  HTN and HLD as evidenced by adherence to CM Team Scheduled appointments through collaboration with RN Care manager, provider, and care team.   Interventions: 1:1 collaboration with primary care provider regarding development and update of comprehensive plan of care as evidenced by provider attestation and co-signature Inter-disciplinary care team collaboration (see longitudinal plan of care) Evaluation of current treatment plan related to  self management and patient's adherence to plan as established by provider   Hyperlipidemia Interventions:  (Status:  Goal on track:  Yes.) Long Term Goal Medication review performed; medication list updated in electronic medical record.  Provider established cholesterol goals reviewed Counseled on importance of regular laboratory monitoring as prescribed Reviewed role and benefits of statin for ASCVD risk reduction Reviewed importance of limiting foods high in cholesterol Reviewed exercise goals and target of 150 minutes per week  Hypertension Interventions:  (Status:  Goal on track:  Yes.) Long Term Goal Last practice recorded BP readings:  BP Readings from Last 3 Encounters:  04/04/21 120/80  02/27/21 130/80  01/09/21 110/80  Most recent eGFR/CrCl: No results found for: EGFR  No components found for: CRCL  Evaluation of current treatment plan related to hypertension self management and patient's adherence to plan as established by provider Counseled on the importance of exercise goals with target of 150 minutes per week Discussed plans with patient for ongoing care management follow up and provided patient with direct contact information for care management team Advised patient, providing education and rationale, to monitor blood pressure daily and record, calling PCP for findings outside established parameters Provided education on prescribed diet low sodium heart healthy Discussed complications of poorly controlled  blood pressure such as heart disease, stroke, circulatory complications, vision complications, kidney impairment, sexual dysfunction  Patient Goals/Self-Care Activities: Take all medications as prescribed Attend all scheduled provider appointments Call pharmacy for medication refills 3-7 days in advance of running out of medications Perform all self care activities independently  Perform IADL's (shopping, preparing meals, housekeeping, managing finances) independently Call provider office for new concerns or questions  check blood pressure weekly choose a place to take my blood pressure (home, clinic or office, retail store) keep a blood pressure log take blood pressure log to all doctor appointments call doctor for signs and symptoms of high blood pressure keep all doctor appointments eat more whole grains, fruits and vegetables, lean meats and healthy fats limit salt intake to 2342m/day call for medicine refill 2 or 3 days before it runs out take all medications exactly as prescribed call doctor with any symptoms you believe are related to your medicine  Follow Up Plan:  Telephone follow up appointment with care management team member scheduled for:  10/07/21 The patient has been provided with contact information for the care management team and has been advised to call with any health related questions or concerns.       Plan:Telephone follow up appointment with care management team member scheduled for:  10/07/21 The patient has been provided with contact  information for the care management team and has been advised to call with any health related questions or concerns.  Peter Garter RN, Jackquline Denmark, CDE Care Management Coordinator [facility] [phone], Mobile [phone]

## 2021-04-11 ENCOUNTER — Telehealth: Payer: Self-pay | Admitting: Internal Medicine

## 2021-04-11 DIAGNOSIS — I447 Left bundle-branch block, unspecified: Secondary | ICD-10-CM

## 2021-04-11 NOTE — Telephone Encounter (Signed)
Jamie Gamble with [facility] called in stating that they need a copy of the patients most recent lab report and ekg tracing.  This could be faxed over to [phone].  Please advise.

## 2021-04-11 NOTE — Telephone Encounter (Signed)
Porfirio Mylar from [facility] called because they still need EKG tracing, they have received everything else.   Fax number is [phone]  Good callback number is [phone]  Please advise

## 2021-04-11 NOTE — Telephone Encounter (Signed)
Note from OV 02/27/21:  She is apparently having laser resurfacing done with a plastic surgeon out of [facility].  I have not received any preop clearance paperwork for her.  However she has scheduled this visit stating that she needs a CBC, CMP and EKG done.  She is feeling well and has no concerns Pre-op examination  - Plan: CBC with Differential/Platelet, Comprehensive metabolic panel, EKG 12-Lead -EKG done in office and interpreted by myself as normal sinus rhythm at a rate of around 72, normal axis, there is an intraventricular conduction defect but no acute ST or T wave changes. -Labs will be ordered per request.  Called pt to confirm that requested info can be sent to [facility]. Pt states Porfirio Mylar is Dr Kermit Balo assistant & that it is ok for information to be sent to her. Pt advised that at this time, information requested will be faxed, however in the future, medical release should be signed or preop paperwork received. Pt verb understanding.

## 2021-04-12 ENCOUNTER — Encounter: Payer: Self-pay | Admitting: Internal Medicine

## 2021-04-12 NOTE — Telephone Encounter (Signed)
Pt states she will come at 2:30 today to have EKG repeated.

## 2021-04-12 NOTE — Telephone Encounter (Signed)
EKG repeated & given to Dr Clent Ridges for interpretation.

## 2021-04-12 NOTE — Telephone Encounter (Signed)
Spruill, Selina S   SS    4:45 PM Note Asencion Partridge from [facility] called because they still need EKG tracing, they have received everything else.     Fax number is [phone]   Good callback number is [phone]   Please advise

## 2021-04-12 NOTE — Telephone Encounter (Signed)
Notified pt that we are unable to locate the hard copy of her EKG. Before having pt to come in to repeat EKG, she requests that we attempt to locate EKG with IT before rescheduling.

## 2021-04-15 NOTE — Addendum Note (Signed)
Addended by: Elza Rafter D on: 04/15/2021 01:33 PM   Modules accepted: Orders

## 2021-04-15 NOTE — Telephone Encounter (Signed)
Per Dr Jerilee Hoh: Looks like EKG from 1/20 shows a LBBB. Without a prior to compare, she will need a cardiology evaluation  Pt notified of above & verb understanding.

## 2021-04-16 ENCOUNTER — Telehealth: Payer: Self-pay | Admitting: Internal Medicine

## 2021-04-16 NOTE — Telephone Encounter (Signed)
Jamie Gamble with [facility] called because she only received the cover sheet of the fax sent yesterday. She is asking for it to be re-faxed.     Fax number is [phone]     Please advise

## 2021-04-17 NOTE — Telephone Encounter (Signed)
See phone encounter from 04/11/21

## 2021-04-17 NOTE — Telephone Encounter (Signed)
Spoke with Jamie Gamble letting her know pt is not cleared for procedure. Jamie Gamble states the pt has already called her to cancel procedure until further notice. Jamie Gamble denies further ques/concerns at this time.

## 2021-04-18 ENCOUNTER — Encounter: Payer: Self-pay | Admitting: Cardiovascular Disease

## 2021-04-18 ENCOUNTER — Ambulatory Visit: Payer: Medicare PPO | Admitting: Cardiovascular Disease

## 2021-04-18 ENCOUNTER — Other Ambulatory Visit: Payer: Self-pay

## 2021-04-18 VITALS — BP 115/75 | HR 77 | Ht 62.5 in | Wt 155.8 lb

## 2021-04-18 DIAGNOSIS — R9431 Abnormal electrocardiogram [ECG] [EKG]: Secondary | ICD-10-CM | POA: Diagnosis not present

## 2021-04-18 DIAGNOSIS — I447 Left bundle-branch block, unspecified: Secondary | ICD-10-CM

## 2021-04-18 NOTE — Progress Notes (Signed)
Cardiology Office Note:   Date:  04/18/2021  NAME:  Jamie Gamble    MRN: [id number] DOB:  03-10-1946   PCP:  Philip Aspen, Limmie Patricia, MD  Cardiologist:  None  Electrophysiologist:  None   Referring MD: Philip Aspen, Estel*   Chief Complaint  Patient presents with   Abnormal ECG    History of Present Illness:   Jamie Gamble is a 76 y.o. female with a hx of HTN, HLD who is being seen today for the evaluation of LBBB at the request of Philip Aspen, Limmie Patricia, MD. recent evaluation by primary care physician for preoperative assessment.  Found to have left bundle branch block.  On discussion with Ms. Huie she reports that this is known.  I was able to view records from [facility] in [place].  In 2018 she was found to have a left bundle branch block.  She underwent a nuclear medicine stress test that was low risk.  She has never had a heart attack or stroke per her record.  She reports possibly a mini stroke but this was likely stress related per her report.  She wishes to stop aspirin which I think is reasonable.  She has been continued on Lipitor 40 mg daily.  Her most recent LDL cholesterol is acceptable.  She reports she does walking without any limitations.  No chest pain or trouble breathing.  She has no evidence of congestive heart failure.  She has no swelling in her legs.  She is a former smoker.  She does not drink alcohol or use drugs.  She is retired from [employer].  She did administrative work.  There is no family history of heart disease.  She does have hypertension but this is well controlled.  She really denies any symptoms in office today.  Problem List HTN HLD -T chol 166, HDL 75, LDL 75, TG 77 LBBB -low risk NM Stress 2018 [facility] [place])  Past Medical History: Past Medical History:  Diagnosis Date   Depression    GERD (gastroesophageal reflux disease)    Hyperlipidemia    Hypertension     Past Surgical History: Past Surgical History:   Procedure Laterality Date   TONSILLECTOMY      Current Medications: Current Meds  Medication Sig   atorvastatin (LIPITOR) 40 MG tablet TAKE 1 TABLET(40 MG) BY MOUTH DAILY   Biotin 10 MG TABS Take by mouth.   hydrochlorothiazide (MICROZIDE) 12.5 MG capsule Take 1 capsule (12.5 mg total) by mouth daily. TAKE 1 CAPSULE(12.5 MG) BY MOUTH DAILY   hydrocortisone (ANUSOL-HC) 2.5 % rectal cream Place 1 application rectally 2 (two) times daily.   losartan (COZAAR) 25 MG tablet Take 1 tablet (25 mg total) by mouth daily.   Multiple Vitamins-Minerals (OCUVITE ADULT 50+ PO) Take by mouth.   OVER THE COUNTER MEDICATION Vitamin D3 125 mg   OVER THE COUNTER MEDICATION Vitamin K2 + D3   Probiotic Product (PROBIOTIC DAILY PO) Take 1 capsule by mouth 1 day or 1 dose.   QUEtiapine (SEROQUEL) 50 MG tablet Take 25 mg by mouth daily.   traZODone (DESYREL) 150 MG tablet Take 25 mg by mouth daily. Half tab daily   vitamin B-12 (CYANOCOBALAMIN) 500 MCG tablet Take 500 mcg by mouth daily.     Allergies:    Nexium [esomeprazole]   Social History: Social History   Socioeconomic History   Marital status: Single    Spouse name: Not on file   Number of children: 0  Years of education: Not on file   Highest education level: Doctorate  Occupational History   Occupation: Retired [employer] Employee  Tobacco Use   Smoking status: Former   Smokeless tobacco: Never  Substance and Sexual Activity   Alcohol use: Not Currently   Drug use: Never   Sexual activity: Not on file  Other Topics Concern   Not on file  Social History Narrative   Not on file   Social Determinants of Health   Financial Resource Strain: Low Risk    Difficulty of Paying Living Expenses: Not hard at all  Food Insecurity: No Food Insecurity   Worried About Programme researcher, broadcasting/film/video in the Last Year: Never true   Barista in the Last Year: Never true  Transportation Needs: No Transportation Needs   Lack of Transportation (Medical):  No   Lack of Transportation (Non-Medical): No  Physical Activity: Sufficiently Active   Days of Exercise per Week: 7 days   Minutes of Exercise per Session: 30 min  Stress: No Stress Concern Present   Feeling of Stress : Not at all  Social Connections: Unknown   Frequency of Communication with Friends and Family: More than three times a week   Frequency of Social Gatherings with Friends and Family: More than three times a week   Attends Religious Services: More than 4 times per year   Active Member of [employer] or Organizations: Yes   Attends Engineer, structural: More than 4 times per year   Marital Status: Patient refused     Family History: The patient's family history includes Cancer in her father; Depression in her brother and mother.  ROS:   All other ROS reviewed and negative. Pertinent positives noted in the HPI.     EKGs/Labs/Other Studies Reviewed:   The following studies were personally reviewed by me today:  EKG:  EKG is ordered today.  The ekg ordered today demonstrates normal sinus rhythm left bundle branch block, and was personally reviewed by me.   NM Stress 2018  Study Impression  Abnormal rest/stress SPECT myocardial perfusion images. An abnormality is seen in the typical vascular distribution of the left anterior descending artery. There is a minimal amount of infarction. There is normal LV function. The LV is normal in size. LV wall thickness is normal. The left ventricular ejection fraction is normal (50-70%). Stress/ECG changes are nondiagnostic. This study suggests a low risk for cardiovascular event and is associated with a cardiac mortality of less than 1% per year. There is no prior study for comparison.   Recent Labs: 06/05/2020: TSH 1.07 02/27/2021: ALT 29; BUN 14; Creatinine, Ser 0.70; Hemoglobin 12.8; Platelets 278.0; Potassium 4.5; Sodium 138   Recent Lipid Panel    Component Value Date/Time   CHOL 166 06/05/2020 1050   TRIG 77.0 06/05/2020  1050   HDL 75.80 06/05/2020 1050   CHOLHDL 2 06/05/2020 1050   VLDL 15.4 06/05/2020 1050   LDLCALC 75 06/05/2020 1050    Physical Exam:   VS:  BP 115/75    Pulse 77    Ht 5' 2.5" (1.588 m)    Wt 155 lb 12.8 oz (70.7 kg)    SpO2 95%    BMI 28.04 kg/m    Wt Readings from Last 3 Encounters:  04/18/21 155 lb 12.8 oz (70.7 kg)  04/04/21 155 lb 9 oz (70.6 kg)  02/27/21 156 lb 9.6 oz (71 kg)    General: Well nourished, well developed, in no  acute distress Head: Atraumatic, normal size  Eyes: PEERLA, EOMI  Neck: Supple, no JVD Endocrine: No thryomegaly Cardiac: Normal S1, S2; RRR; no murmurs, rubs, or gallops Lungs: Clear to auscultation bilaterally, no wheezing, rhonchi or rales  Abd: Soft, nontender, no hepatomegaly  Ext: No edema, pulses 2+ Musculoskeletal: No deformities, BUE and BLE strength normal and equal Skin: Warm and dry, no rashes   Neuro: Alert and oriented to person, place, time, and situation, CNII-XII grossly intact, no focal deficits  Psych: Normal mood and affect   ASSESSMENT:   Betti CruzGay Leinweber is a 76 y.o. female who presents for the following: 1. LBBB (left bundle branch block)   2. Nonspecific abnormal electrocardiogram (ECG) (EKG)     PLAN:   1. Nonspecific abnormal electrocardiogram (ECG) (EKG) 2. LBBB (left bundle branch block) -Known history of left bundle branch block.  She had this worked up in [place] in 2018.  Records are available in care everywhere.  She had a nuclear medicine stress test that was low risk.  She has no chest pain or trouble breathing.  Her left bundle branch block is not new.  She has no evidence of congestive heart failure.  Her CVD risk factors are well controlled.  Blood pressure well controlled.  Cholesterol well controlled.  I would not recommend any change to her regimen.  She does wish to stop aspirin.  I think this is reasonable.  There is questionable history of TIA.  I believe given the uncertainty she can stop aspirin.  We  will just continue with cholesterol control.  No symptoms and she will see us back as needed.  Disposition: Return if symptoms worsen or fail to improve.  Medication Adjustments/Labs and Tests Ordered: Current medicines are reviewed at length with the patient today.  Concerns regarding medicines are outlined above.  Orders Placed This Encounter  Procedures   EKG 12-Lead   No orders of the defined types were placed in this encounter.   Patient Instructions  Medication Instructions:  The current medical regimen is effective;  continue present plan and medications.  *If you need a refill on your cardiac medications before your next appointment, please call your pharmacy*   Follow-Up: At [facility], you and your health needs are our priority.  As part of our continuing mission to provide you with exceptional heart care, we have created designated Provider Care Teams.  These Care Teams include your primary Cardiologist (physician) and Advanced Practice Providers (APPs -  Physician Assistants and Nurse Practitioners) who all work together to provide you with the care you need, when you need it.  We recommend signing up for the patient portal called "MyChart".  Sign up information is provided on this After Visit Summary.  MyChart is used to connect with patients for Virtual Visits (Telemedicine).  Patients are able to view lab/test results, encounter notes, upcoming appointments, etc.  Non-urgent messages can be sent to your provider as well.   To learn more about what you can do with MyChart, go to [email or web address].    Your next appointment:   As needed  The format for your next appointment:   In Person  Provider:   Lennie OdorWesley O'Neal, MD      Signed, Lenna GilfordWesley T. Flora Lipps'Neal, MD, [facility] [place]   [facility]  [address], [address] [place], [place] [address] [phone]  04/18/2021 10:34 AM

## 2021-04-18 NOTE — Patient Instructions (Signed)
Medication Instructions:  The current medical regimen is effective;  continue present plan and medications.  *If you need a refill on your cardiac medications before your next appointment, please call your pharmacy*    Follow-Up: At [facility], you and your health needs are our priority.  As part of our continuing mission to provide you with exceptional heart care, we have created designated Provider Care Teams.  These Care Teams include your primary Cardiologist (physician) and Advanced Practice Providers (APPs -  Physician Assistants and Nurse Practitioners) who all work together to provide you with the care you need, when you need it.  We recommend signing up for the patient portal called "MyChart".  Sign up information is provided on this After Visit Summary.  MyChart is used to connect with patients for Virtual Visits (Telemedicine).  Patients are able to view lab/test results, encounter notes, upcoming appointments, etc.  Non-urgent messages can be sent to your provider as well.   To learn more about what you can do with MyChart, go to [email or web address].    Your next appointment:   As needed  The format for your next appointment:   In Person  Provider:   [place] O'Neal, MD      

## 2021-04-23 DIAGNOSIS — E785 Hyperlipidemia, unspecified: Secondary | ICD-10-CM | POA: Diagnosis not present

## 2021-04-23 DIAGNOSIS — I10 Essential (primary) hypertension: Secondary | ICD-10-CM | POA: Diagnosis not present

## 2021-04-24 ENCOUNTER — Ambulatory Visit: Payer: Medicare PPO | Admitting: Family Medicine

## 2021-04-26 ENCOUNTER — Encounter: Payer: Self-pay | Admitting: Internal Medicine

## 2021-05-13 DIAGNOSIS — F4320 Adjustment disorder, unspecified: Secondary | ICD-10-CM | POA: Diagnosis not present

## 2021-05-27 DIAGNOSIS — F4320 Adjustment disorder, unspecified: Secondary | ICD-10-CM | POA: Diagnosis not present

## 2021-06-03 DIAGNOSIS — F4320 Adjustment disorder, unspecified: Secondary | ICD-10-CM | POA: Diagnosis not present

## 2021-06-04 ENCOUNTER — Emergency Department (HOSPITAL_COMMUNITY): Payer: Medicare PPO

## 2021-06-04 ENCOUNTER — Emergency Department (HOSPITAL_COMMUNITY)
Admission: EM | Admit: 2021-06-04 | Discharge: 2021-06-04 | Disposition: A | Discharge status: Home / Self Care | Payer: Medicare PPO | Attending: Emergency Medicine | Admitting: Emergency Medicine

## 2021-06-04 ENCOUNTER — Other Ambulatory Visit: Payer: Self-pay

## 2021-06-04 ENCOUNTER — Encounter (HOSPITAL_COMMUNITY): Payer: Self-pay

## 2021-06-04 ENCOUNTER — Telehealth: Payer: Self-pay

## 2021-06-04 ENCOUNTER — Other Ambulatory Visit: Payer: Self-pay | Admitting: Internal Medicine

## 2021-06-04 DIAGNOSIS — R45851 Suicidal ideations: Secondary | ICD-10-CM | POA: Diagnosis not present

## 2021-06-04 DIAGNOSIS — Z20822 Contact with and (suspected) exposure to covid-19: Secondary | ICD-10-CM | POA: Diagnosis not present

## 2021-06-04 DIAGNOSIS — I10 Essential (primary) hypertension: Secondary | ICD-10-CM | POA: Diagnosis not present

## 2021-06-04 DIAGNOSIS — Z046 Encounter for general psychiatric examination, requested by authority: Secondary | ICD-10-CM | POA: Diagnosis not present

## 2021-06-04 DIAGNOSIS — F332 Major depressive disorder, recurrent severe without psychotic features: Secondary | ICD-10-CM | POA: Insufficient documentation

## 2021-06-04 DIAGNOSIS — F32A Depression, unspecified: Secondary | ICD-10-CM | POA: Diagnosis present

## 2021-06-04 DIAGNOSIS — Z008 Encounter for other general examination: Secondary | ICD-10-CM

## 2021-06-04 LAB — CBC WITH DIFFERENTIAL/PLATELET
Abs Immature Granulocytes: 0.02 10*3/uL (ref 0.00–0.07)
Basophils Absolute: 0 10*3/uL (ref 0.0–0.1)
Basophils Relative: 1 %
Eosinophils Absolute: 0.1 10*3/uL (ref 0.0–0.5)
Eosinophils Relative: 2 %
HCT: 41.2 % (ref 36.0–46.0)
Hemoglobin: 14 g/dL (ref 12.0–15.0)
Immature Granulocytes: 0 %
Lymphocytes Relative: 12 %
Lymphs Abs: 0.8 10*3/uL (ref 0.7–4.0)
MCH: 31.9 pg (ref 26.0–34.0)
MCHC: 34 g/dL (ref 30.0–36.0)
MCV: 93.8 fL (ref 80.0–100.0)
Monocytes Absolute: 0.4 10*3/uL (ref 0.1–1.0)
Monocytes Relative: 6 %
Neutro Abs: 5.4 10*3/uL (ref 1.7–7.7)
Neutrophils Relative %: 79 %
Platelets: 292 10*3/uL (ref 150–400)
RBC: 4.39 MIL/uL (ref 3.87–5.11)
RDW: 12.7 % (ref 11.5–15.5)
WBC: 6.7 10*3/uL (ref 4.0–10.5)
nRBC: 0 % (ref 0.0–0.2)

## 2021-06-04 LAB — COMPREHENSIVE METABOLIC PANEL
ALT: 16 U/L (ref 0–44)
AST: 22 U/L (ref 15–41)
Albumin: 3.8 g/dL (ref 3.5–5.0)
Alkaline Phosphatase: 51 U/L (ref 38–126)
Anion gap: 9 (ref 5–15)
BUN: 13 mg/dL (ref 8–23)
CO2: 28 mmol/L (ref 22–32)
Calcium: 9.1 mg/dL (ref 8.9–10.3)
Chloride: 99 mmol/L (ref 98–111)
Creatinine, Ser: 0.47 mg/dL (ref 0.44–1.00)
GFR, Estimated: >60 mL/min (ref >60)
Glucose, Bld: 116 mg/dL — ABNORMAL HIGH (ref 70–99)
Potassium: 3.4 mmol/L — ABNORMAL LOW (ref 3.5–5.1)
Sodium: 136 mmol/L (ref 135–145)
Total Bilirubin: 0.3 mg/dL (ref 0.3–1.2)
Total Protein: 6.7 g/dL (ref 6.5–8.1)

## 2021-06-04 LAB — RESP PANEL BY RT-PCR (FLU A&B, COVID) ARPGX2
Influenza A by PCR: NEGATIVE
Influenza B by PCR: NEGATIVE
SARS Coronavirus 2 by RT PCR: NEGATIVE

## 2021-06-04 LAB — RAPID URINE DRUG SCREEN, HOSP PERFORMED
Amphetamines: NOT DETECTED
Barbiturates: NOT DETECTED
Benzodiazepines: NOT DETECTED
Cocaine: NOT DETECTED
Opiates: NOT DETECTED
Tetrahydrocannabinol: NOT DETECTED

## 2021-06-04 LAB — ACETAMINOPHEN LEVEL: Acetaminophen (Tylenol), Serum: <10 ug/mL — ABNORMAL LOW (ref 10–30)

## 2021-06-04 LAB — ETHANOL: Alcohol, Ethyl (B): <10 mg/dL (ref <10)

## 2021-06-04 LAB — SALICYLATE LEVEL: Salicylate Lvl: <7 mg/dL — ABNORMAL LOW (ref 7.0–30.0)

## 2021-06-04 NOTE — ED Triage Notes (Signed)
Patient reports that she is experiencing depression and states she has a long history of this. Patient states, "I need to be admitted for this because I am unable to eat or take care of myself." ?Patient denies any suicidal thoughts. ?
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
## 2021-06-04 NOTE — Discharge Instructions (Addendum)
For your behavioral health needs, you are advised to follow up with a Partial Hospitalization Program (PHP).  These programs meet several hours a day, several days a week, and they offer psychiatry and therapy.  The provider listed below currently offers PHP in-person.  Contact them at your earliest opportunity to schedule an intake appointment: ? ?     [facility] ?     [address] ?     [place], [place] [address] ?     [phone] ?      ?There is a behavioral health hospital that can see you.  I have put the information in the paperwork to give them a call or just show up they are open 24 hours a day. ?

## 2021-06-04 NOTE — BH Assessment (Signed)
[facility] Assessment Progress Note ?  ?Per Caryn Bee, NP , this voluntary pt does not require psychiatric hospitalization at this time.  Pt is psychiatrically cleared.  Pt would benefit from a Partial Hospitalization Program, but is only interested in in-person treatment rather than virtual.  Discharge instructions include referral information for [facility].  EDP Melene Plan, DO and pt's nurse, Destiny, have been notified. ? ?Jamie Canning, MA ?Triage Specialist ?[phone] ? ?

## 2021-06-04 NOTE — ED Notes (Signed)
Pt has been changed.   Has 3 bags in cabinet 13-18. ?

## 2021-06-04 NOTE — ED Provider Notes (Signed)
?[facility]-EMERGENCY DEPT ?Provider Note ? ? ?CSN: [id number] ?Arrival date & time: 06/04/21  1048 ? ?  ? ?History ? ?Chief Complaint  ?Patient presents with  ? Depression  ? ? ?Jamie Gamble is a 76 y.o. female. ? ?76 yo F with a cc of suicidal ideation.  Patient states that she is chronically depressed but feeling is gotten worse since she started Zoloft a few days ago.  Feels like she needs to be hospitalized so that she can get her medications re titrated. ? ?Denies overt SI HI.  Denies hallucinations. ? ? ?Depression ? ? ?  ? ?Home Medications ?Prior to Admission medications   ?Medication Sig Start Date End Date Taking? Authorizing Provider  ?atorvastatin (LIPITOR) 40 MG tablet TAKE 1 TABLET(40 MG) BY MOUTH DAILY ?Patient taking differently: Take 40 mg by mouth daily. 04/04/21   Philip Aspen, Limmie Patricia, MD  ?Biotin 10 MG TABS Take 10 mg by mouth daily.    [provider]  ?Cholecalciferol (VITAMIN D-3) 125 MCG (5000 UT) TABS Take 5,000 Units by mouth daily.    [provider]  ?hydrochlorothiazide (MICROZIDE) 12.5 MG capsule TAKE 1 CAPSULE(12.5 MG) BY MOUTH DAILY ?Patient taking differently: Take 12.5 mg by mouth daily. 06/04/21   Philip Aspen, Limmie Patricia, MD  ?hydrocortisone (ANUSOL-HC) 2.5 % rectal cream Place 1 application rectally 2 (two) times daily. 04/04/21   Philip Aspen, Limmie Patricia, MD  ?losartan (COZAAR) 25 MG tablet TAKE 1 TABLET(25 MG) BY MOUTH DAILY ?Patient taking differently: Take 25 mg by mouth daily. 06/04/21   Philip Aspen, Limmie Patricia, MD  ?Multiple Vitamins-Minerals (OCUVITE ADULT 50+ PO) Take 1 tablet by mouth daily.    [provider]  ?Probiotic Product (PROBIOTIC DAILY PO) Take 1 capsule by mouth daily.    [provider]  ?QUEtiapine (SEROQUEL) 25 MG tablet Take 25 mg by mouth at bedtime. 05/10/21   [provider]  ?sertraline (ZOLOFT) 25 MG tablet Take 25 mg by mouth daily. 05/14/21   [provider]  ?traZODone  (DESYREL) 50 MG tablet Take 100-150 mg by mouth at bedtime. 05/10/21   [provider]  ?vitamin B-12 (CYANOCOBALAMIN) 500 MCG tablet Take 500 mcg by mouth daily.    [provider]  ?Vitamin D-Vitamin K (VITAMIN K2-VITAMIN D3 PO) Take 1 tablet by mouth daily.    [provider]  ?   ? ?Allergies    ?Nexium [esomeprazole]   ? ?Review of Systems   ?Review of Systems  ?Psychiatric/Behavioral:  Positive for depression.   ? ?Physical Exam ?Updated Vital Signs ?BP 132/82 (BP Location: Left Arm)   Pulse 98   Temp 97.7 ?F (36.5 ?C) (Oral) Comment: Simultaneous filing. User may not have seen previous data. Comment (Src): Simultaneous filing. User may not have seen previous data.  Resp 16   Ht 5' 2.5" (1.588 m)   Wt 69.9 kg   SpO2 96%   BMI 27.72 kg/m?  ?Physical Exam ?Vitals and nursing note reviewed.  ?Constitutional:   ?   General: She is not in acute distress. ?   Appearance: She is well-developed. She is not diaphoretic.  ?HENT:  ?   Head: Normocephalic and atraumatic.  ?Eyes:  ?   Pupils: Pupils are equal, round, and reactive to light.  ?Cardiovascular:  ?   Rate and Rhythm: Normal rate and regular rhythm.  ?   Heart sounds: No murmur heard. ?  No friction rub. No gallop.  ?Pulmonary:  ?  Effort: Pulmonary effort is normal.  ?   Breath sounds: No wheezing or rales.  ?Abdominal:  ?   General: There is no distension.  ?   Palpations: Abdomen is soft.  ?   Tenderness: There is no abdominal tenderness.  ?Musculoskeletal:     ?   General: No tenderness.  ?   Cervical back: Normal range of motion and neck supple.  ?Skin: ?   General: Skin is warm and dry.  ?Neurological:  ?   Mental Status: She is alert and oriented to person, place, and time.  ?Psychiatric:     ?   Behavior: Behavior normal.  ? ? ?ED Results / Procedures / Treatments   ?Labs ?(all labs ordered are listed, but only abnormal results are displayed) ?Labs Reviewed  ?RESP PANEL BY RT-PCR (FLU A&B, COVID) [id number]  ?COMPREHENSIVE  METABOLIC PANEL  ?ETHANOL  ?RAPID URINE DRUG SCREEN, HOSP PERFORMED  ?CBC WITH DIFFERENTIAL/PLATELET  ?SALICYLATE LEVEL  ?ACETAMINOPHEN LEVEL  ? ? ?EKG ?None ? ?Radiology ?No results found. ? ?Procedures ?Procedures  ? ? ?Medications Ordered in ED ?Medications - No data to display ? ?ED Course/ Medical Decision Making/ A&P ?  ?                        ?Medical Decision Making ?Amount and/or Complexity of Data Reviewed ?Labs: ordered. ?Radiology: ordered. ? ? ?76 yo F with a cc of worsening depression and concern for self harm.  Going on chronically but worse over the past couple days after starting Zoloft.  She denies any medical complaints.  Feel she is likely medically clear but I will obtain screening labs based on her age.  TTS evaluation. ? ?Patient's blood work is resulted without any significant issue.  Chest x-ray independently interpreted by me without focal infiltrate.  He was medically cleared for TTS evaluation. ? ?The patients results and plan were reviewed and discussed.   ?Any x-rays performed were independently reviewed by myself.  ? ?Differential diagnosis were considered with the presenting HPI. ? ?Medications - No data to display ? ?Vitals:  ? 06/04/21 1108  ?BP: 132/82  ?Pulse: 98  ?Resp: 16  ?Temp: 97.7 ?F (36.5 ?C)  ?TempSrc: Oral  ?SpO2: 96%  ?Weight: 69.9 kg  ?Height: 5' 2.5" (1.588 m)  ? ? ?Final diagnoses:  ?Medical clearance for psychiatric admission  ? ? ? ? ? ? ? ? ? ?Final Clinical Impression(s) / ED Diagnoses ?Final diagnoses:  ?Medical clearance for psychiatric admission  ? ? ?Rx / DC Orders ?ED Discharge Orders   ? ? None  ? ?  ? ? ?  ?Melene Plan, DO ?06/04/21 1531 ? ?

## 2021-06-04 NOTE — BH Assessment (Addendum)
@  1235, requested patient's nurse (Destiny, RN), to set up the TTS machine if patient is available to be evaluated by TTS.  ?

## 2021-06-04 NOTE — Telephone Encounter (Signed)
--  Caller states she is currently severely depressed and ?psychiatrist does not have hospital privilege. Caller ?states she is alone and cant eat or take care of herself. ? ?06/04/2021 10:08:44 AM Go to ED Now Doyle Askew, RN, [place] ? ?Referrals ?[facility] - ED ? ?06/04/21 1046 - pt states she is on her to [facility]. ?

## 2021-06-04 NOTE — BH Assessment (Addendum)
TTS completed. Patient is psych cleared by Malachy Chamber, DNP to discharge home. Denies SI, HI, and/or AVH's. Recommended for Partial Hospitalization and/or PHP.  ? ?Patient refused recommendations PHP/Partial. Stating, "I don't have anyone at home to talk to about my breakup". Clinician provided therapeutic listening to patient's concerns. In addition, to providing patient with the unique benefits of participation in PHP/Psych IOP to assist her with current symptoms reported during today's TTS assessment.  ? ?Patient continued to request inpatient services. Stated that her depression is so bad that she doesn't have the energy to set up her own PHP/IOP services. Clinician offered patient to set up her services and/or have the Disposition Counselor assist in with setting these resources up for her.  ? ?She refused stating, "I'll just go home". Patient stood up attempted to leave the room, turned off the TTS machine before leaving.  ? ?Disposition Counselor (Tom) noted resources in patient's chart/AVS for Partial/Psych IOP at [facility] outpatient department [facility]). Also, [employer] as an Art therapist.  ? ?Disposition updates provided to EDP Melene Plan, MD) and patient's nurse Babette Relic, RN).  ?

## 2021-06-05 DIAGNOSIS — F4323 Adjustment disorder with mixed anxiety and depressed mood: Secondary | ICD-10-CM | POA: Diagnosis not present

## 2021-06-06 DIAGNOSIS — F331 Major depressive disorder, recurrent, moderate: Secondary | ICD-10-CM | POA: Diagnosis not present

## 2021-06-06 DIAGNOSIS — F332 Major depressive disorder, recurrent severe without psychotic features: Secondary | ICD-10-CM | POA: Diagnosis not present

## 2021-06-06 DIAGNOSIS — F607 Dependent personality disorder: Secondary | ICD-10-CM | POA: Diagnosis not present

## 2021-06-06 DIAGNOSIS — Z79899 Other long term (current) drug therapy: Secondary | ICD-10-CM | POA: Diagnosis not present

## 2021-06-06 DIAGNOSIS — F329 Major depressive disorder, single episode, unspecified: Secondary | ICD-10-CM | POA: Diagnosis not present

## 2021-06-06 DIAGNOSIS — F32A Depression, unspecified: Secondary | ICD-10-CM | POA: Diagnosis not present

## 2021-06-06 DIAGNOSIS — Z20822 Contact with and (suspected) exposure to covid-19: Secondary | ICD-10-CM | POA: Diagnosis not present

## 2021-06-06 DIAGNOSIS — F419 Anxiety disorder, unspecified: Secondary | ICD-10-CM | POA: Diagnosis not present

## 2021-06-06 DIAGNOSIS — F4323 Adjustment disorder with mixed anxiety and depressed mood: Secondary | ICD-10-CM | POA: Diagnosis not present

## 2021-06-06 DIAGNOSIS — I10 Essential (primary) hypertension: Secondary | ICD-10-CM | POA: Diagnosis not present

## 2021-06-06 DIAGNOSIS — R45851 Suicidal ideations: Secondary | ICD-10-CM | POA: Diagnosis not present

## 2021-06-06 DIAGNOSIS — I447 Left bundle-branch block, unspecified: Secondary | ICD-10-CM | POA: Diagnosis not present

## 2021-06-13 DIAGNOSIS — F4323 Adjustment disorder with mixed anxiety and depressed mood: Secondary | ICD-10-CM | POA: Diagnosis not present

## 2021-06-17 DIAGNOSIS — F332 Major depressive disorder, recurrent severe without psychotic features: Secondary | ICD-10-CM | POA: Diagnosis not present

## 2021-06-28 DIAGNOSIS — F4323 Adjustment disorder with mixed anxiety and depressed mood: Secondary | ICD-10-CM | POA: Diagnosis not present

## 2021-07-02 DIAGNOSIS — F4323 Adjustment disorder with mixed anxiety and depressed mood: Secondary | ICD-10-CM | POA: Diagnosis not present

## 2021-07-08 DIAGNOSIS — F332 Major depressive disorder, recurrent severe without psychotic features: Secondary | ICD-10-CM | POA: Diagnosis not present

## 2021-07-08 DIAGNOSIS — F329 Major depressive disorder, single episode, unspecified: Secondary | ICD-10-CM | POA: Diagnosis not present

## 2021-07-10 ENCOUNTER — Encounter: Payer: Medicare PPO | Admitting: Internal Medicine

## 2021-07-15 DIAGNOSIS — F332 Major depressive disorder, recurrent severe without psychotic features: Secondary | ICD-10-CM | POA: Diagnosis not present

## 2021-07-15 DIAGNOSIS — F329 Major depressive disorder, single episode, unspecified: Secondary | ICD-10-CM | POA: Diagnosis not present

## 2021-07-17 DIAGNOSIS — F329 Major depressive disorder, single episode, unspecified: Secondary | ICD-10-CM | POA: Diagnosis not present

## 2021-07-17 DIAGNOSIS — F332 Major depressive disorder, recurrent severe without psychotic features: Secondary | ICD-10-CM | POA: Diagnosis not present

## 2021-07-22 DIAGNOSIS — F332 Major depressive disorder, recurrent severe without psychotic features: Secondary | ICD-10-CM | POA: Diagnosis not present

## 2021-07-22 DIAGNOSIS — F329 Major depressive disorder, single episode, unspecified: Secondary | ICD-10-CM | POA: Diagnosis not present

## 2021-07-24 DIAGNOSIS — F332 Major depressive disorder, recurrent severe without psychotic features: Secondary | ICD-10-CM | POA: Diagnosis not present

## 2021-07-24 DIAGNOSIS — F329 Major depressive disorder, single episode, unspecified: Secondary | ICD-10-CM | POA: Diagnosis not present

## 2021-07-26 DIAGNOSIS — F329 Major depressive disorder, single episode, unspecified: Secondary | ICD-10-CM | POA: Diagnosis not present

## 2021-07-26 DIAGNOSIS — F332 Major depressive disorder, recurrent severe without psychotic features: Secondary | ICD-10-CM | POA: Diagnosis not present

## 2021-07-29 ENCOUNTER — Encounter: Payer: Self-pay | Admitting: Internal Medicine

## 2021-07-29 DIAGNOSIS — F332 Major depressive disorder, recurrent severe without psychotic features: Secondary | ICD-10-CM | POA: Diagnosis not present

## 2021-07-29 DIAGNOSIS — F329 Major depressive disorder, single episode, unspecified: Secondary | ICD-10-CM | POA: Diagnosis not present

## 2021-07-30 ENCOUNTER — Ambulatory Visit: Payer: Medicare PPO | Admitting: Behavioral Health

## 2021-07-31 DIAGNOSIS — F329 Major depressive disorder, single episode, unspecified: Secondary | ICD-10-CM | POA: Diagnosis not present

## 2021-07-31 DIAGNOSIS — F332 Major depressive disorder, recurrent severe without psychotic features: Secondary | ICD-10-CM | POA: Diagnosis not present

## 2021-08-01 ENCOUNTER — Ambulatory Visit: Payer: Medicare PPO | Admitting: Adult Health

## 2021-08-01 ENCOUNTER — Telehealth: Payer: Self-pay

## 2021-08-01 NOTE — Progress Notes (Signed)
No charge. 

## 2021-08-01 NOTE — Telephone Encounter (Signed)
--  Caller states her blood pressure is 141/87 and he she ?is having ECT treatments and it may make her blood ?pressure to go even higher. She denies any s/s other ?than elevated BP. ? ?08/01/2021 11:16:47 AM See PCP within 2 Weeks D'Heur Ezzard Standing, RN, [place] ? ?Pt has appt with PCP for CPE on 08/13/21. ?

## 2021-08-02 DIAGNOSIS — F332 Major depressive disorder, recurrent severe without psychotic features: Secondary | ICD-10-CM | POA: Diagnosis not present

## 2021-08-02 DIAGNOSIS — F329 Major depressive disorder, single episode, unspecified: Secondary | ICD-10-CM | POA: Diagnosis not present

## 2021-08-05 DIAGNOSIS — F332 Major depressive disorder, recurrent severe without psychotic features: Secondary | ICD-10-CM | POA: Diagnosis not present

## 2021-08-05 DIAGNOSIS — F329 Major depressive disorder, single episode, unspecified: Secondary | ICD-10-CM | POA: Diagnosis not present

## 2021-08-12 DIAGNOSIS — F329 Major depressive disorder, single episode, unspecified: Secondary | ICD-10-CM | POA: Diagnosis not present

## 2021-08-12 DIAGNOSIS — F339 Major depressive disorder, recurrent, unspecified: Secondary | ICD-10-CM | POA: Diagnosis not present

## 2021-08-12 DIAGNOSIS — F4323 Adjustment disorder with mixed anxiety and depressed mood: Secondary | ICD-10-CM | POA: Diagnosis not present

## 2021-08-12 DIAGNOSIS — F332 Major depressive disorder, recurrent severe without psychotic features: Secondary | ICD-10-CM | POA: Diagnosis not present

## 2021-08-13 ENCOUNTER — Encounter: Payer: Self-pay | Admitting: Internal Medicine

## 2021-08-13 ENCOUNTER — Ambulatory Visit (INDEPENDENT_AMBULATORY_CARE_PROVIDER_SITE_OTHER): Payer: Medicare PPO | Admitting: Internal Medicine

## 2021-08-13 VITALS — BP 120/80 | HR 78 | Temp 97.7°F | Ht 62.0 in | Wt 154.5 lb

## 2021-08-13 DIAGNOSIS — Z13820 Encounter for screening for osteoporosis: Secondary | ICD-10-CM | POA: Diagnosis not present

## 2021-08-13 DIAGNOSIS — I10 Essential (primary) hypertension: Secondary | ICD-10-CM | POA: Diagnosis not present

## 2021-08-13 DIAGNOSIS — Z780 Asymptomatic menopausal state: Secondary | ICD-10-CM

## 2021-08-13 DIAGNOSIS — Z1231 Encounter for screening mammogram for malignant neoplasm of breast: Secondary | ICD-10-CM

## 2021-08-13 DIAGNOSIS — K219 Gastro-esophageal reflux disease without esophagitis: Secondary | ICD-10-CM | POA: Diagnosis not present

## 2021-08-13 DIAGNOSIS — Z0000 Encounter for general adult medical examination without abnormal findings: Secondary | ICD-10-CM | POA: Diagnosis not present

## 2021-08-13 DIAGNOSIS — E785 Hyperlipidemia, unspecified: Secondary | ICD-10-CM

## 2021-08-13 DIAGNOSIS — F332 Major depressive disorder, recurrent severe without psychotic features: Secondary | ICD-10-CM | POA: Diagnosis not present

## 2021-08-13 LAB — CBC WITH DIFFERENTIAL/PLATELET
Basophils Absolute: 0.1 10*3/uL (ref 0.0–0.1)
Basophils Relative: 0.9 % (ref 0.0–3.0)
Eosinophils Absolute: 0.2 10*3/uL (ref 0.0–0.7)
Eosinophils Relative: 2.7 % (ref 0.0–5.0)
HCT: 40.7 % (ref 36.0–46.0)
Hemoglobin: 13.7 g/dL (ref 12.0–15.0)
Lymphocytes Relative: 11.3 % — ABNORMAL LOW (ref 12.0–46.0)
Lymphs Abs: 0.7 10*3/uL (ref 0.7–4.0)
MCHC: 33.7 g/dL (ref 30.0–36.0)
MCV: 96.5 fl (ref 78.0–100.0)
Monocytes Absolute: 0.3 10*3/uL (ref 0.1–1.0)
Monocytes Relative: 5.7 % (ref 3.0–12.0)
Neutro Abs: 4.6 10*3/uL (ref 1.4–7.7)
Neutrophils Relative %: 79.4 % — ABNORMAL HIGH (ref 43.0–77.0)
Platelets: 303 10*3/uL (ref 150.0–400.0)
RBC: 4.22 Mil/uL (ref 3.87–5.11)
RDW: 13.8 % (ref 11.5–15.5)
WBC: 5.8 10*3/uL (ref 4.0–10.5)

## 2021-08-13 LAB — LIPID PANEL
Cholesterol: 187 mg/dL (ref 0–200)
HDL: 81.1 mg/dL (ref >39.00)
LDL Cholesterol: 89 mg/dL (ref 0–99)
NonHDL: 106.35
Total CHOL/HDL Ratio: 2
Triglycerides: 89 mg/dL (ref 0.0–149.0)
VLDL: 17.8 mg/dL (ref 0.0–40.0)

## 2021-08-13 LAB — COMPREHENSIVE METABOLIC PANEL
ALT: 14 U/L (ref 0–35)
AST: 19 U/L (ref 0–37)
Albumin: 4 g/dL (ref 3.5–5.2)
Alkaline Phosphatase: 48 U/L (ref 39–117)
BUN: 22 mg/dL (ref 6–23)
CO2: 30 mEq/L (ref 19–32)
Calcium: 9.3 mg/dL (ref 8.4–10.5)
Chloride: 104 mEq/L (ref 96–112)
Creatinine, Ser: 0.85 mg/dL (ref 0.40–1.20)
GFR: 67.4 mL/min (ref >60.00)
Glucose, Bld: 100 mg/dL — ABNORMAL HIGH (ref 70–99)
Potassium: 4.6 mEq/L (ref 3.5–5.1)
Sodium: 140 mEq/L (ref 135–145)
Total Bilirubin: 0.4 mg/dL (ref 0.2–1.2)
Total Protein: 6.8 g/dL (ref 6.0–8.3)

## 2021-08-13 LAB — VITAMIN D 25 HYDROXY (VIT D DEFICIENCY, FRACTURES): VITD: 30.7 ng/mL (ref 30.00–100.00)

## 2021-08-13 LAB — HEMOGLOBIN A1C: Hgb A1c MFr Bld: 5.3 % (ref 4.6–6.5)

## 2021-08-13 LAB — VITAMIN B12: Vitamin B-12: 1021 pg/mL — ABNORMAL HIGH (ref 211–911)

## 2021-08-13 LAB — TSH: TSH: 1.26 u[IU]/mL (ref 0.35–5.50)

## 2021-08-13 NOTE — Progress Notes (Signed)
Established Patient Office Visit     CC/Reason for Visit: Annual preventive exam and subsequent Medicare wellness visit  HPI: Jamie Gamble is a 76 y.o. female who is coming in today for the above mentioned reasons. Past Medical History is significant for: Hypertension, hyperlipidemia, GERD, depression.  She was hospitalized for 2 weeks earlier this year at behavioral health.  She just completed electroconvulsive therapy sessions.  She is being followed by psychiatry currently on Remeron 30 mg at bedtime, Seroquel 25 mg at bedtime and trazodone 100 mg at bedtime.  She is due for eye and dental care, no perceived hearing difficulty.  She exercises by working on the treadmill.  She is overdue for shingles vaccines.  She is overdue for mammogram and consideration of a Pap smear as well as a DEXA.  She had a colonoscopy in 2019.   Past Medical/Surgical History: Past Medical History:  Diagnosis Date   Depression    GERD (gastroesophageal reflux disease)    Hyperlipidemia    Hypertension     Past Surgical History:  Procedure Laterality Date   TONSILLECTOMY      Social History:  reports that she has quit smoking. She has never used smokeless tobacco. She reports that she does not currently use alcohol. She reports that she does not use drugs.  Allergies: Allergies  Allergen Reactions   Nexium [Esomeprazole] Diarrhea    Family History:  Family History  Problem Relation Age of Onset   Depression Mother    Cancer Father    Depression Brother      Current Outpatient Medications:    atorvastatin (LIPITOR) 40 MG tablet, TAKE 1 TABLET(40 MG) BY MOUTH DAILY (Patient taking differently: Take 40 mg by mouth at bedtime.), Disp: 90 tablet, Rfl: 1   Cholecalciferol (VITAMIN D-3) 125 MCG (5000 UT) TABS, Take 5,000 Units by mouth daily., Disp: , Rfl:    Glycerin-Hypromellose-PEG 400 (VISINE DRY EYE OP), Place 1 drop into both eyes daily as needed (dry eyes)., Disp: , Rfl:     hydrochlorothiazide (MICROZIDE) 12.5 MG capsule, TAKE 1 CAPSULE(12.5 MG) BY MOUTH DAILY (Patient taking differently: Take 12.5 mg by mouth daily.), Disp: 90 capsule, Rfl: 0   losartan (COZAAR) 25 MG tablet, TAKE 1 TABLET(25 MG) BY MOUTH DAILY (Patient taking differently: Take 25 mg by mouth daily.), Disp: 90 tablet, Rfl: 0   Probiotic Product (PROBIOTIC DAILY PO), Take 1 capsule by mouth daily., Disp: , Rfl:    QUEtiapine (SEROQUEL) 25 MG tablet, Take 25 mg by mouth at bedtime., Disp: , Rfl:    traZODone (DESYREL) 50 MG tablet, Take 100-150 mg by mouth at bedtime., Disp: , Rfl:   Review of Systems:  Constitutional: Denies fever, chills, diaphoresis, appetite change and fatigue.  HEENT: Denies photophobia, eye pain, redness, hearing loss, ear pain, congestion, sore throat, rhinorrhea, sneezing, mouth sores, trouble swallowing, neck pain, neck stiffness and tinnitus.   Respiratory: Denies SOB, DOE, cough, chest tightness,  and wheezing.   Cardiovascular: Denies chest pain, palpitations and leg swelling.  Gastrointestinal: Denies nausea, vomiting, abdominal pain, diarrhea, constipation, blood in stool and abdominal distention.  Genitourinary: Denies dysuria, urgency, frequency, hematuria, flank pain and difficulty urinating.  Endocrine: Denies: hot or cold intolerance, sweats, changes in hair or nails, polyuria, polydipsia. Musculoskeletal: Denies myalgias, back pain, joint swelling, arthralgias and gait problem.  Skin: Denies pallor, rash and wound.  Neurological: Denies dizziness, seizures, syncope, weakness, light-headedness, numbness and headaches.  Hematological: Denies adenopathy. Easy bruising, personal or family  bleeding history  Psychiatric/Behavioral: Denies suicidal ideation, mood changes, confusion, nervousness, sleep disturbance and agitation    Physical Exam: Vitals:   08/13/21 0828 08/13/21 0859  BP: 120/90 120/80  Pulse: 78   Temp: 97.7 F (36.5 C)   TempSrc: Oral   SpO2:  96%   Weight: 154 lb 8 oz (70.1 kg)   Height: 5\' 2"  (1.575 m)     Body mass index is 28.26 kg/m.   Constitutional: NAD, calm, comfortable Eyes: PERRL, lids and conjunctivae normal ENMT: Mucous membranes are moist. Posterior pharynx clear of any exudate or lesions. Normal dentition. Tympanic membrane is pearly white, no erythema or bulging. Neck: normal, supple, no masses, no thyromegaly Respiratory: clear to auscultation bilaterally, no wheezing, no crackles. Normal respiratory effort. No accessory muscle use.  Cardiovascular: Regular rate and rhythm, no murmurs / rubs / gallops. No extremity edema. 2+ pedal pulses. No carotid bruits.  Abdomen: no tenderness, no masses palpated. No hepatosplenomegaly. Bowel sounds positive.  Musculoskeletal: no clubbing / cyanosis. No joint deformity upper and lower extremities. Good ROM, no contractures. Normal muscle tone.  Skin: no rashes, lesions, ulcers. No induration Neurologic: CN 2-12 grossly intact. Sensation intact, DTR normal. Strength 5/5 in all 4.  Psychiatric: Normal judgment and insight. Alert and oriented x 3. Normal mood.    Subsequent Medicare wellness visit   1. Risk factors, based on past  M,S,F -cardiovascular disease risk factors include age, history of hypertension and hyperlipidemia   2.  Physical activities: She walks on a daily basis   3.  Depression/mood: History of depression but mood appears somewhat stable today   4.  Hearing: No perceived issues   5.  ADL's: Independent in all ADLs   6.  Fall risk: Low fall risk   7.  Home safety: No problems identified   8.  Height weight, and visual acuity: height and weight as above, vision:  Vision Screening   Right eye Left eye Both eyes  Without correction 20/32 20/80 20/32   With correction        9.  Counseling: Advised routine eye and dental care, advise she update vaccinations at pharmacy   10. Lab orders based on risk factors: Laboratory update will be  reviewed   11. Referral : Mammogram   12. Care plan: Follow-up with me in 6 months   13. Cognitive assessment: No cognitive impairment   14. Screening: Patient provided with a written and personalized 5-10 year screening schedule in the AVS. yes   15. Provider List Update: PCP, psychiatry  16. Advance Directives: Full code   17. Opioids: Patient is not on any opioid prescriptions and has no risk factors for a substance use disorder.   Flowsheet Row Office Visit from 08/13/2021 in [facility] [place]  PHQ-9 Total Score 0          10/18/2020    9:29 AM 02/26/2021   10:46 AM 02/27/2021    9:42 AM 06/04/2021   11:10 AM 08/13/2021    8:32 AM  Fall Risk  Falls in the past year? 0 0 0  0  Was there an injury with Fall? 0  0  0  Fall Risk Category Calculator 0  0  0  Fall Risk Category Low  Low  Low  Patient Fall Risk Level    Low fall risk Low fall risk  Patient at Risk for Falls Due to     No Fall Risks  Fall risk Follow up  Falls evaluation completed     Impression and Plan:  Encounter for preventive health examination -Recommend routine eye and dental care. -Immunizations: Advise shingles series at pharmacy, other immunizations are up-to-date -Healthy lifestyle discussed in detail. -Labs to be updated today. -Colon cancer screening: 03/2017 -Breast cancer screening: Overdue, ordered -Cervical cancer screening: Declines today -Lung cancer screening: Not applicable -Prostate cancer screening: Not applicable -DEXA: Requested today  Primary hypertension  - Plan: CBC with Differential/Platelet, Comprehensive metabolic panel, Hemoglobin A1c -Well-controlled on hydrochlorothiazide 12.5 mg and losartan 25 mg daily.  Hyperlipidemia, unspecified hyperlipidemia type  - Plan: Lipid panel -On atorvastatin 40 mg daily.  Gastroesophageal reflux disease without esophagitis -Well-controlled, not on daily PPI therapy.  Severe episode of recurrent major depressive  disorder, without psychotic features (HCC)  - Plan: TSH, Vitamin B12, VITAMIN D 25 Hydroxy (Vit-D Deficiency, Fractures) -Followed by psychiatry on multiple psychotropic medications.     Patient Instructions  -Nice seeing you today!!  -Lab work today; will notify you once results are available.  -Mammogram will be scheduled.  -Make sure you update your eye and dental exams.  -See you back in 6 months or sooner as needed.      Chaya JanEstela Hernandez Acosta, MD [facility]

## 2021-08-13 NOTE — Patient Instructions (Signed)
-  Nice seeing you today!!  -Lab work today; will notify you once results are available.  -Mammogram will be scheduled.  -Make sure you update your eye and dental exams.  -See you back in 6 months or sooner as needed.

## 2021-08-20 DIAGNOSIS — F607 Dependent personality disorder: Secondary | ICD-10-CM | POA: Diagnosis not present

## 2021-08-20 DIAGNOSIS — F3342 Major depressive disorder, recurrent, in full remission: Secondary | ICD-10-CM | POA: Diagnosis not present

## 2021-08-22 DIAGNOSIS — H04123 Dry eye syndrome of bilateral lacrimal glands: Secondary | ICD-10-CM | POA: Diagnosis not present

## 2021-08-22 DIAGNOSIS — H2513 Age-related nuclear cataract, bilateral: Secondary | ICD-10-CM | POA: Diagnosis not present

## 2021-08-27 ENCOUNTER — Ambulatory Visit: Payer: Medicare PPO | Admitting: Internal Medicine

## 2021-08-28 ENCOUNTER — Ambulatory Visit: Payer: Medicare PPO | Admitting: Internal Medicine

## 2021-08-29 ENCOUNTER — Telehealth: Payer: Self-pay | Admitting: Internal Medicine

## 2021-08-29 MED ORDER — LEVOCETIRIZINE DIHYDROCHLORIDE 5 MG PO TABS: 5.0000 mg | ORAL_TABLET | Freq: Every evening | ORAL | 1 refills | Status: DC

## 2021-08-29 NOTE — Telephone Encounter (Signed)
Requested Rx sent

## 2021-08-29 NOTE — Telephone Encounter (Signed)
Pt states she has sleep apnea and is requesting a prescription antihistamine to help with the nasal drip. Has appointment scheduled for 09/02/21   [id number] - [place], [place] - [address] AT [facility] [address] Phone:  [phone]  Fax:  [phone]

## 2021-08-30 DIAGNOSIS — G4733 Obstructive sleep apnea (adult) (pediatric): Secondary | ICD-10-CM | POA: Diagnosis not present

## 2021-09-02 ENCOUNTER — Encounter: Payer: Self-pay | Admitting: Internal Medicine

## 2021-09-02 ENCOUNTER — Ambulatory Visit: Payer: Medicare PPO | Admitting: Internal Medicine

## 2021-09-02 VITALS — BP 110/70 | HR 100 | Temp 97.8°F | Ht 62.75 in | Wt 157.3 lb

## 2021-09-02 DIAGNOSIS — M79645 Pain in left finger(s): Secondary | ICD-10-CM | POA: Diagnosis not present

## 2021-09-02 DIAGNOSIS — F4323 Adjustment disorder with mixed anxiety and depressed mood: Secondary | ICD-10-CM | POA: Diagnosis not present

## 2021-09-02 NOTE — Progress Notes (Signed)
Established Patient Office Visit     CC/Reason for Visit: Pain and difficulty moving left thumb  HPI: Jamie Gamble is a 76 y.o. female who is coming in today for the above mentioned reasons.  She has been having these issues with her left thumb she states for a couple months.  She is left-handed so this is especially uncomfortable for her.  She has difficulty bending her thumb both at the MCP and interphalangeal joint, a little painful at both joints as well.  No injury that she can recall.  Past Medical/Surgical History: Past Medical History:  Diagnosis Date   Depression    GERD (gastroesophageal reflux disease)    Hyperlipidemia    Hypertension     Past Surgical History:  Procedure Laterality Date   TONSILLECTOMY      Social History:  reports that she has quit smoking. She has never used smokeless tobacco. She reports that she does not currently use alcohol. She reports that she does not use drugs.  Allergies: Allergies  Allergen Reactions   Nexium [Esomeprazole] Diarrhea    Family History:  Family History  Problem Relation Age of Onset   Depression Mother    Cancer Father    Depression Brother      Current Outpatient Medications:    atorvastatin (LIPITOR) 40 MG tablet, TAKE 1 TABLET(40 MG) BY MOUTH DAILY (Patient taking differently: Take 40 mg by mouth at bedtime.), Disp: 90 tablet, Rfl: 1   Glycerin-Hypromellose-PEG 400 (VISINE DRY EYE OP), Place 1 drop into both eyes daily as needed (dry eyes)., Disp: , Rfl:    hydrochlorothiazide (MICROZIDE) 12.5 MG capsule, TAKE 1 CAPSULE(12.5 MG) BY MOUTH DAILY (Patient taking differently: Take 12.5 mg by mouth daily.), Disp: 90 capsule, Rfl: 0   levocetirizine (XYZAL ALLERGY 24HR) 5 MG tablet, Take 1 tablet (5 mg total) by mouth every evening., Disp: 90 tablet, Rfl: 1   losartan (COZAAR) 25 MG tablet, TAKE 1 TABLET(25 MG) BY MOUTH DAILY (Patient taking differently: Take 25 mg by mouth daily.), Disp: 90 tablet, Rfl: 0    mirtazapine (REMERON) 30 MG tablet, Take 30 mg by mouth at bedtime., Disp: , Rfl:    Probiotic Product (PROBIOTIC DAILY PO), Take 1 capsule by mouth daily., Disp: , Rfl:    QUEtiapine (SEROQUEL) 25 MG tablet, Take 25 mg by mouth. Take 2 tabs at bedtime, Disp: , Rfl:    traZODone (DESYREL) 50 MG tablet, Take 100-150 mg by mouth at bedtime., Disp: , Rfl:   Review of Systems:  Constitutional: Denies fever, chills, diaphoresis, appetite change and fatigue.  HEENT: Denies photophobia, eye pain, redness, hearing loss, ear pain, congestion, sore throat, rhinorrhea, sneezing, mouth sores, trouble swallowing, neck pain, neck stiffness and tinnitus.   Respiratory: Denies SOB, DOE, cough, chest tightness,  and wheezing.   Cardiovascular: Denies chest pain, palpitations and leg swelling.  Gastrointestinal: Denies nausea, vomiting, abdominal pain, diarrhea, constipation, blood in stool and abdominal distention.  Genitourinary: Denies dysuria, urgency, frequency, hematuria, flank pain and difficulty urinating.  Endocrine: Denies: hot or cold intolerance, sweats, changes in hair or nails, polyuria, polydipsia. Musculoskeletal: Denies myalgias, back pain, joint swelling, arthralgias and gait problem.  Skin: Denies pallor, rash and wound.  Neurological: Denies dizziness, seizures, syncope, weakness, light-headedness, numbness and headaches.  Hematological: Denies adenopathy. Easy bruising, personal or family bleeding history  Psychiatric/Behavioral: Denies suicidal ideation, mood changes, confusion, nervousness, sleep disturbance and agitation    Physical Exam: Vitals:   09/02/21 0952  BP: 110/70  Pulse: 100  Temp: 97.8 F (36.6 C)  TempSrc: Oral  SpO2: 98%  Weight: 157 lb 4.8 oz (71.4 kg)  Height: 5' 2.75" (1.594 m)    Body mass index is 28.09 kg/m.   Constitutional: NAD, calm, comfortable Eyes: PERRL, lids and conjunctivae normal ENMT: Mucous membranes are moist.  Psychiatric: Normal  judgment and insight. Alert and oriented x 3. Normal mood.    Impression and Plan:  Pain of left thumb  - Plan: AMB referral to orthopedics, unclear if this is a trigger finger or an intra-articular issue.    Time spent:21 minutes reviewing chart, interviewing and examining patient and formulating plan of care.    [name], MD [facility] at [facility]

## 2021-09-05 DIAGNOSIS — F332 Major depressive disorder, recurrent severe without psychotic features: Secondary | ICD-10-CM | POA: Diagnosis not present

## 2021-09-11 ENCOUNTER — Encounter: Payer: Self-pay | Admitting: Orthopaedic Surgery

## 2021-09-11 ENCOUNTER — Ambulatory Visit (INDEPENDENT_AMBULATORY_CARE_PROVIDER_SITE_OTHER): Payer: Medicare PPO

## 2021-09-11 ENCOUNTER — Ambulatory Visit: Payer: Medicare PPO | Admitting: Orthopaedic Surgery

## 2021-09-11 DIAGNOSIS — M79645 Pain in left finger(s): Secondary | ICD-10-CM

## 2021-09-11 MED ORDER — BUPIVACAINE HCL 0.5 % IJ SOLN
0.3300 mL | INTRAMUSCULAR | Status: AC | PRN
  Administered 2021-09-11: .33 mL

## 2021-09-11 MED ORDER — LIDOCAINE HCL 1 % IJ SOLN
0.3000 mL | INTRAMUSCULAR | Status: AC | PRN
  Administered 2021-09-11: .3 mL

## 2021-09-11 MED ORDER — METHYLPREDNISOLONE ACETATE 40 MG/ML IJ SUSP
13.3300 mg | INTRAMUSCULAR | Status: AC | PRN
  Administered 2021-09-11: 13.33 mg

## 2021-09-11 NOTE — Progress Notes (Signed)
Office Visit Note   Patient: Jamie Gamble           Date of Birth: [birth year]           MRN: [id number] Visit Date: 09/11/2021              Requested by: Philip Aspen, Limmie Patricia, MD [address] [place],  [place] [address] PCP: Philip Aspen, Limmie Patricia, MD   Assessment & Plan: Visit Diagnoses:  1. Pain of left thumb     Plan: Based on findings impression is atypical presentation of trigger thumb versus attritional FPL rupture.  I do not think this is an AIN palsy.  Lack of FPL activation could be related to pain and guarding.  Offered referral to Dr. Frazier Butt for further evaluation versus trying an injection today into the flexor tendon sheath to see if this will restore function.  Based on her options she would rather try the injection first.  She should follow-up in a couple weeks if symptoms persist.  Follow-Up Instructions: No follow-ups on file.   Orders:  Orders Placed This Encounter  Procedures   XR Finger Thumb Left   No orders of the defined types were placed in this encounter.     Procedures: Hand/UE Inj: L thumb A1 for trigger finger on 09/11/2021 9:51 AM Indications: pain Details: 25 G needle Medications: 0.3 mL lidocaine 1 %; 0.33 mL bupivacaine 0.5 %; 13.33 mg methylPREDNISolone acetate 40 MG/ML Outcome: tolerated well, no immediate complications Consent was given by the patient. Patient was prepped and draped in the usual sterile fashion.       Clinical Data: No additional findings.   Subjective: Chief Complaint  Patient presents with   Left Hand - Pain    Left thumb    HPI Ms. Jamie Gamble is a 76 year old female here for evaluation of inability to flex her left thumb for about 2 to 3 months.  Denies any injuries.  There is questionable pain with flexion of the thumb.  She is able to oppose the thumb to the fifth metacarpal head. Denies any numbness and tingling fingertips.  Review of Systems  Constitutional: Negative.   HENT: Negative.     Eyes: Negative.   Respiratory: Negative.    Cardiovascular: Negative.   Endocrine: Negative.   Musculoskeletal: Negative.   Neurological: Negative.   Hematological: Negative.   Psychiatric/Behavioral: Negative.    All other systems reviewed and are negative.    Objective: Vital Signs: There were no vitals taken for this visit.  Physical Exam Vitals and nursing note reviewed.  Constitutional:      Appearance: She is well-developed.  HENT:     Head: Normocephalic and atraumatic.  Pulmonary:     Effort: Pulmonary effort is normal.  Abdominal:     Palpations: Abdomen is soft.  Musculoskeletal:     Cervical back: Neck supple.  Skin:    General: Skin is warm.     Capillary Refill: Capillary refill takes less than 2 seconds.  Neurological:     Mental Status: She is alert and oriented to person, place, and time.  Psychiatric:        Behavior: Behavior normal.        Thought Content: Thought content normal.        Judgment: Judgment normal.     Ortho Exam Examination of left thumb shows inability to flex the IP joint.  FPL is nontender.  She is unable to hold the thumb in a flexed position.  I do not feel any triggering with passive motion of FPL.  She does have function to the DIP of the index finger and sensation in all fingertips.  Normal capillary refill.    Specialty Comments:  No specialty comments available.  Imaging: XR Finger Thumb Left  Result Date: 09/11/2021 No acute abnormalities.  Spurring to the thumb metacarpal head.  Mild arthritic changes to the IP joint.    PMFS History: Patient Active Problem List   Diagnosis Date Noted   Depression    Hypertension    Hyperlipidemia    GERD (gastroesophageal reflux disease)    Past Medical History:  Diagnosis Date   Depression    GERD (gastroesophageal reflux disease)    Hyperlipidemia    Hypertension     Family History  Problem Relation Age of Onset   Depression Mother    Cancer Father    Depression  Brother     Past Surgical History:  Procedure Laterality Date   TONSILLECTOMY     Social History   Occupational History   Occupation: Retired  [employer] Employee  Tobacco Use   Smoking status: Former   Smokeless tobacco: Never  Building services engineer Use: Never used  Substance and Sexual Activity   Alcohol use: Not Currently   Drug use: Never   Sexual activity: Not on file

## 2021-09-12 ENCOUNTER — Other Ambulatory Visit: Payer: Medicare PPO

## 2021-09-13 ENCOUNTER — Telehealth: Payer: Self-pay | Admitting: Internal Medicine

## 2021-09-16 ENCOUNTER — Ambulatory Visit: Payer: Self-pay

## 2021-09-16 DIAGNOSIS — I10 Essential (primary) hypertension: Secondary | ICD-10-CM

## 2021-09-16 DIAGNOSIS — E785 Hyperlipidemia, unspecified: Secondary | ICD-10-CM

## 2021-09-16 NOTE — Telephone Encounter (Signed)
Patient is aware and will call back to schedule an appointment 

## 2021-09-23 ENCOUNTER — Encounter: Payer: Self-pay | Admitting: Orthopaedic Surgery

## 2021-09-23 ENCOUNTER — Ambulatory Visit: Payer: Medicare PPO

## 2021-09-30 ENCOUNTER — Telehealth: Payer: Medicare PPO | Admitting: Internal Medicine

## 2021-09-30 DIAGNOSIS — K219 Gastro-esophageal reflux disease without esophagitis: Secondary | ICD-10-CM

## 2021-09-30 MED ORDER — PANTOPRAZOLE SODIUM 40 MG PO TBEC: 40.0000 mg | DELAYED_RELEASE_TABLET | Freq: Every day | ORAL | 0 refills | Status: DC

## 2021-09-30 NOTE — Progress Notes (Signed)
Virtual Visit via Video Note  I connected with Jamie Gamble on 09/30/21 at 11:30 AM EDT by a video enabled telemedicine application and verified that I am speaking with the correct person using two identifiers.  Location patient: home Location provider: work office Persons participating in the virtual visit: patient, provider  I discussed the limitations of evaluation and management by telemedicine and the availability of in person appointments. The patient expressed understanding and agreed to proceed.   HPI: She has been having a burning sensation in her throat for a few weeks.  She believes this is due to rapid weight gain that she had while being treated with Remeron for her depression.  She also has a sour taste in her mouth, however no belching or nausea.   ROS: Constitutional: Denies fever, chills, diaphoresis, appetite change and fatigue.  HEENT: Denies photophobia, eye pain, redness, hearing loss, ear pain, congestion, sore throat, rhinorrhea, sneezing, mouth sores, trouble swallowing, neck pain, neck stiffness and tinnitus.   Respiratory: Denies SOB, DOE, cough, chest tightness,  and wheezing.   Cardiovascular: Denies chest pain, palpitations and leg swelling.  Gastrointestinal: Denies nausea, vomiting,  diarrhea, constipation, blood in stool and abdominal distention.  Genitourinary: Denies dysuria, urgency, frequency, hematuria, flank pain and difficulty urinating.  Endocrine: Denies: hot or cold intolerance, sweats, changes in hair or nails, polyuria, polydipsia. Musculoskeletal: Denies myalgias, back pain, joint swelling, arthralgias and gait problem.  Skin: Denies pallor, rash and wound.  Neurological: Denies dizziness, seizures, syncope, weakness, light-headedness, numbness and headaches.  Hematological: Denies adenopathy. Easy bruising, personal or family bleeding history  Psychiatric/Behavioral: Denies suicidal ideation, mood changes, confusion, nervousness, sleep  disturbance and agitation   Past Medical History:  Diagnosis Date   Depression    GERD (gastroesophageal reflux disease)    Hyperlipidemia    Hypertension     Past Surgical History:  Procedure Laterality Date   TONSILLECTOMY      Family History  Problem Relation Age of Onset   Depression Mother    Cancer Father    Depression Brother     SOCIAL HX:   reports that she has quit smoking. She has never used smokeless tobacco. She reports that she does not currently use alcohol. She reports that she does not use drugs.   Current Outpatient Medications:    atorvastatin (LIPITOR) 40 MG tablet, TAKE 1 TABLET(40 MG) BY MOUTH DAILY (Patient taking differently: Take 40 mg by mouth at bedtime.), Disp: 90 tablet, Rfl: 1   Glycerin-Hypromellose-PEG 400 (VISINE DRY EYE OP), Place 1 drop into both eyes daily as needed (dry eyes)., Disp: , Rfl:    hydrochlorothiazide (MICROZIDE) 12.5 MG capsule, TAKE 1 CAPSULE(12.5 MG) BY MOUTH DAILY (Patient taking differently: Take 12.5 mg by mouth daily.), Disp: 90 capsule, Rfl: 0   levocetirizine (XYZAL ALLERGY 24HR) 5 MG tablet, Take 1 tablet (5 mg total) by mouth every evening., Disp: 90 tablet, Rfl: 1   losartan (COZAAR) 25 MG tablet, TAKE 1 TABLET(25 MG) BY MOUTH DAILY (Patient taking differently: Take 25 mg by mouth daily.), Disp: 90 tablet, Rfl: 0   mirtazapine (REMERON) 30 MG tablet, Take 30 mg by mouth at bedtime., Disp: , Rfl:    pantoprazole (PROTONIX) 40 MG tablet, Take 1 tablet (40 mg total) by mouth daily., Disp: 90 tablet, Rfl: 0   Probiotic Product (PROBIOTIC DAILY PO), Take 1 capsule by mouth daily., Disp: , Rfl:    QUEtiapine (SEROQUEL) 25 MG tablet, Take 25 mg by mouth. Take 2  tabs at bedtime, Disp: , Rfl:    traZODone (DESYREL) 50 MG tablet, Take 100-150 mg by mouth at bedtime., Disp: , Rfl:   EXAM:   VITALS per patient if applicable: None reported  GENERAL: alert, oriented, appears well and in no acute distress  HEENT: atraumatic,  conjunttiva clear, no obvious abnormalities on inspection of external nose and ears  NECK: normal movements of the head and neck  LUNGS: on inspection no signs of respiratory distress, breathing rate appears normal, no obvious gross increased work of breathing, gasping or wheezing  CV: no obvious cyanosis  MS: moves all visible extremities without noticeable abnormality  PSYCH/NEURO: pleasant and cooperative, no obvious depression or anxiety, speech and thought processing grossly intact  ASSESSMENT AND PLAN:   Gastroesophageal reflux disease, unspecified whether esophagitis present  - Plan: pantoprazole (PROTONIX) 40 MG tablet, DISCONTINUED: pantoprazole (PROTONIX) 40 MG tablet -She will try pantoprazole 40 mg daily for about 6 weeks and then wean.  She will notify me if issues persist.     I discussed the assessment and treatment plan with the patient. The patient was provided an opportunity to ask questions and all were answered. The patient agreed with the plan and demonstrated an understanding of the instructions.   The patient was advised to call back or seek an in-person evaluation if the symptoms worsen or if the condition fails to improve as anticipated.    [name], MD  [facility]

## 2021-10-03 ENCOUNTER — Ambulatory Visit: Payer: Medicare PPO | Admitting: Adult Health

## 2021-10-03 ENCOUNTER — Encounter: Payer: Self-pay | Admitting: Adult Health

## 2021-10-03 VITALS — BP 158/99 | HR 86 | Ht 62.5 in | Wt 160.0 lb

## 2021-10-03 DIAGNOSIS — F411 Generalized anxiety disorder: Secondary | ICD-10-CM

## 2021-10-03 DIAGNOSIS — G4700 Insomnia, unspecified: Secondary | ICD-10-CM

## 2021-10-03 DIAGNOSIS — F331 Major depressive disorder, recurrent, moderate: Secondary | ICD-10-CM

## 2021-10-03 MED ORDER — QUETIAPINE FUMARATE 25 MG PO TABS: ORAL_TABLET | ORAL | 2 refills | Status: DC

## 2021-10-03 MED ORDER — ESCITALOPRAM OXALATE 10 MG PO TABS: ORAL_TABLET | ORAL | 2 refills | Status: DC

## 2021-10-03 MED ORDER — TRAZODONE HCL 50 MG PO TABS: 100.0000 mg | ORAL_TABLET | Freq: Every day | ORAL | 2 refills | Status: DC

## 2021-10-03 NOTE — Progress Notes (Addendum)
[facility] MD/PA/NP Initial Note  10/03/2021 12:52 PM Jamie Gamble  MRN:  [id number]  Chief Complaint:   HPI:  Patient seen today for initial psychiatric evaluation.   Previously seen by Dr Evelene Croon. Will request records.   Planning to see therapist - Dr. Farrel Demark in August.  Recently hospitalized at [facility] in May for depression - completed ECT.   Collateral reviewed.  Describes mood today as "so-so". Pleasant. Denies tearful. Mood symptoms - reports some depression, anxiety, and irritability. Reports some worry and rumination. Mood is mostly down. Stating "my mood is worse when I'm not sleeping". Symptoms recently worsened after having to stop Remeron due to excessive weight gain. Has been off the Remeron for approximately 3 weeks and is struggling to establish a consistent sleep schedule. Report sleeping every other night for about 5 to 7 hours. Denies daytime napping. Stating "I'm trying to do what I should, but it's not helping". Concerned her mood will decline again if not getting the rest she needs. Willing to consider other medication options. Varying interest and motivation. Taking medications as prescribed.  Energy levels lower. Active, does not have a regular exercise routine.   Enjoys some usual interests and activities. Single. Lives alone. Not dating. No family local. Has a close friend local. Attends church.  Appetite adequate. Weight gain - 160.80 pounds. Sleeps well most nights. Averages 5 to 7 hours every other night. Denies daytime napping. Focus and concentration stable - reports some memory loss from ECT. Completing tasks. Managing aspects of household. Retired at age 72 from [employer]. Denies SI or HI.  Denies AH or VH. Denies self harm. Denies substance use.   Previous medication trials: Trazodone, Hydroxyzine, Zoloft, Remeron, Seroquel  Visit Diagnosis:    ICD-10-CM   1. Major depressive disorder, recurrent episode, moderate (HCC)  F33.1     2. Insomnia, unspecified type   G47.00     3. Generalized anxiety disorder  F41.1       Past Psychiatric History: first time at 10 for mutism 2/2 anxiety, most recent in [place], [place] in 2018. Most recent [facility] x2 in 2023.  Past Medical History:  Past Medical History:  Diagnosis Date   Depression    GERD (gastroesophageal reflux disease)    Hyperlipidemia    Hypertension     Past Surgical History:  Procedure Laterality Date   TONSILLECTOMY      Family Psychiatric History: Family history of mental illness.   Family History:  Family History  Problem Relation Age of Onset   Depression Mother    Cancer Father    Depression Brother     Social History:  Social History   Socioeconomic History   Marital status: Single    Spouse name: Not on file   Number of children: 0   Years of education: Not on file   Highest education level: Doctorate  Occupational History   Occupation: Retired [employer] Employee  Tobacco Use   Smoking status: Former   Smokeless tobacco: Never  Building services engineer Use: Never used  Substance and Sexual Activity   Alcohol use: Not Currently   Drug use: Never   Sexual activity: Not on file  Other Topics Concern   Not on file  Social History Narrative   Not on file   Social Determinants of Health   Financial Resource Strain: Low Risk  (02/26/2021)   Overall Financial Resource Strain (CARDIA)    Difficulty of Paying Living Expenses: Not hard at all  Food Insecurity: No Food Insecurity (02/26/2021)   Hunger Vital Sign    Worried About Running Out of Food in the Last Year: Never true    Ran Out of Food in the Last Year: Never true  Transportation Needs: No Transportation Needs (02/26/2021)   PRAPARE - Hydrologist (Medical): No    Lack of Transportation (Non-Medical): No  Physical Activity: Sufficiently Active (02/26/2021)   Exercise Vital Sign    Days of Exercise per Week: 7 days    Minutes of Exercise per Session: 30 min  Stress: No Stress Concern  Present (02/26/2021)   [place]    Feeling of Stress : Not at all  Social Connections: Unknown (02/26/2021)   Social Connection and Isolation Panel [NHANES]    Frequency of Communication with Friends and Family: More than three times a week    Frequency of Social Gatherings with Friends and Family: More than three times a week    Attends Religious Services: More than 4 times per year    Active Member of [employer] or Organizations: Yes    Attends Archivist Meetings: More than 4 times per year    Marital Status: Patient refused    Allergies:  Allergies  Allergen Reactions   Nexium [Esomeprazole] Diarrhea    Metabolic Disorder Labs: Lab Results  Component Value Date   HGBA1C 5.3 08/13/2021   No results found for: "PROLACTIN" Lab Results  Component Value Date   CHOL 187 08/13/2021   TRIG 89.0 08/13/2021   HDL 81.10 08/13/2021   CHOLHDL 2 08/13/2021   VLDL 17.8 08/13/2021   LDLCALC 89 08/13/2021   LDLCALC 75 06/05/2020   Lab Results  Component Value Date   TSH 1.26 08/13/2021   TSH 1.07 06/05/2020    Therapeutic Level Labs: No results found for: "LITHIUM" No results found for: "VALPROATE" No results found for: "CBMZ"  Current Medications: Current Outpatient Medications  Medication Sig Dispense Refill   escitalopram (LEXAPRO) 10 MG tablet Take 1/2 tablet daily x 7 days, then increase to one tablet daily. 30 tablet 2   atorvastatin (LIPITOR) 40 MG tablet TAKE 1 TABLET(40 MG) BY MOUTH DAILY (Patient taking differently: Take 40 mg by mouth at bedtime.) 90 tablet 1   Glycerin-Hypromellose-PEG 400 (VISINE DRY EYE OP) Place 1 drop into both eyes daily as needed (dry eyes).     hydrochlorothiazide (MICROZIDE) 12.5 MG capsule TAKE 1 CAPSULE(12.5 MG) BY MOUTH DAILY (Patient taking differently: Take 12.5 mg by mouth daily.) 90 capsule 0   levocetirizine (XYZAL ALLERGY 24HR) 5 MG tablet Take 1 tablet (5 mg  total) by mouth every evening. 90 tablet 1   losartan (COZAAR) 25 MG tablet TAKE 1 TABLET(25 MG) BY MOUTH DAILY (Patient taking differently: Take 25 mg by mouth daily.) 90 tablet 0   pantoprazole (PROTONIX) 40 MG tablet Take 1 tablet (40 mg total) by mouth daily. 90 tablet 0   Probiotic Product (PROBIOTIC DAILY PO) Take 1 capsule by mouth daily.     QUEtiapine (SEROQUEL) 25 MG tablet Take one to three tablets at bedtime. 90 tablet 2   traZODone (DESYREL) 50 MG tablet Take 2-3 tablets (100-150 mg total) by mouth at bedtime. 90 tablet 2   No current facility-administered medications for this visit.    Medication Side Effects: none  Orders placed this visit:  No orders of the defined types were placed in this encounter.   Psychiatric Specialty Exam:  Review of Systems  Musculoskeletal:  Negative for gait problem.  Neurological:  Negative for tremors.  Psychiatric/Behavioral:         Please refer to HPI    Blood pressure (!) 158/99, pulse 86, height 5' 2.5" (1.588 m), weight 160 lb (72.6 kg).Body mass index is 28.8 kg/m.  General Appearance: Casual and Neat  Eye Contact:  Good  Speech:  Clear and Coherent and Normal Rate  Volume:  Normal  Mood:  Euthymic  Affect:  Appropriate and Congruent  Thought Process:  Coherent and Descriptions of Associations: Intact  Orientation:  Full (Time, Place, and Person)  Thought Content: Logical   Suicidal Thoughts:  No  Homicidal Thoughts:  No  Memory:  WNL  Judgement:  Good  Insight:  Good  Psychomotor Activity:  Normal  Concentration:  Concentration: Good and Attention Span: Good  Recall:  Good  Fund of Knowledge: Good  Language: Good  Assets:  Communication Skills Desire for Improvement Financial Resources/Insurance Housing Intimacy Leisure Time Physical Health Resilience Social Support Talents/Skills Transportation Vocational/Educational  ADL's:  Intact  Cognition: WNL  Prognosis:  Good   Screenings:  Oceanographer  Row Office Visit from 08/13/2021 in [facility] [place] Office Visit from 04/04/2021 in [facility] [employer] from 02/27/2021 in [facility] at [employer] from 10/18/2020 in [facility] from 07/16/2020 in [facility] Total Score 0 0 0 0 0  PHQ-9 Total Score 0 -- 0 0 --      Flowsheet Row ED from 06/04/2021 in [facility]  C-SSRS RISK CATEGORY No Risk       Receiving Psychotherapy: No   Treatment Plan/Recommendations:   Plan:  PDMP reviewed  Increase Seroquel 25mg  - 2 at bedtime to 2 to 3 tablets at bedtime for sleep Trazadone 50mg  - taking one to three tablets at bedtime for sleep Add Lexapro 10mg  daily for depression and anxiety  Patient seen for 30 minutes and time spent discussing treatment options.  RTC 4 weeks  Patient advised to contact office with any questions, adverse effects, or acute worsening in signs and symptoms.  Discussed potential metabolic side effects associated with atypical antipsychotics, as well as potential risk for movement side effects. Advised pt to contact office if movement side effects occur.       , NP

## 2021-10-07 ENCOUNTER — Telehealth: Payer: Medicare PPO

## 2021-10-14 ENCOUNTER — Telehealth: Payer: Self-pay | Admitting: Internal Medicine

## 2021-10-14 MED ORDER — LOSARTAN POTASSIUM 25 MG PO TABS: 25.0000 mg | ORAL_TABLET | Freq: Every day | ORAL | 1 refills | Status: DC

## 2021-10-14 NOTE — Telephone Encounter (Signed)
Refill sent.

## 2021-10-14 NOTE — Telephone Encounter (Signed)
Pt call and stated she need a refill on losartan (COZAAR) 25 MG tablet sent to  [facility] [place], [place] - [address] [facility] [place] Phone:  [phone]  Fax:  [phone]

## 2021-10-15 ENCOUNTER — Ambulatory Visit: Payer: Medicare PPO | Admitting: Adult Health

## 2021-10-21 ENCOUNTER — Encounter: Payer: Self-pay | Admitting: Adult Health

## 2021-10-21 ENCOUNTER — Ambulatory Visit (INDEPENDENT_AMBULATORY_CARE_PROVIDER_SITE_OTHER): Payer: Medicare PPO | Admitting: Adult Health

## 2021-10-21 DIAGNOSIS — F331 Major depressive disorder, recurrent, moderate: Secondary | ICD-10-CM

## 2021-10-21 DIAGNOSIS — G4700 Insomnia, unspecified: Secondary | ICD-10-CM | POA: Diagnosis not present

## 2021-10-21 DIAGNOSIS — F411 Generalized anxiety disorder: Secondary | ICD-10-CM

## 2021-10-21 NOTE — Progress Notes (Signed)
Jamie Gamble [id number] October 20, 1945 76 y.o.  Subjective:   Patient ID:  Jamie Gamble is a 76 y.o. (DOB 02-06-1946) female.  Chief Complaint: No chief complaint on file.   HPI  Jamie Gamble presents to the office today for follow-up of Patient seen today for initial psychiatric evaluation.   Previously seen by Dr Evelene Croon. Will request records.   Planning to see therapist - Dr. Farrel Demark in August.  Accompanied by friend.  Describes mood today as "about the same". Pleasant. Denies tearfulness. Mood symptoms - reports depression and anxiety. Denies irritability. Reports some worry and rumination. Mood is mostly down. Stating "I haven't seen a lot of difference with the medication changes". Her friend notes an improvement with addition of Lexapro 5mg  daily. After the increase in Lexapro to 10mg  daily, mood did not continue to improve and maybe got worse. Was more active an engaged with Lexapro initially. Would like to continue at 5mg  dose. Willing to consider other medication options. Varying interest and motivation. Taking medications as prescribed.  Energy levels lower. Active, does not have a regular exercise routine.   Enjoys some usual interests and activities. Single. Lives alone. Not dating. No family local. Has a close friend local. Attends church.  Appetite adequate. Weight gain - 160.80 pounds. Sleeps better well most nights. Averaged 8 hours last night with Benadryl - reports 3 hours before the Benadryl. Denies daytime napping. Focus and concentration better - reports some memory loss from ECT. Completing tasks. Managing aspects of household. Retired at age 60 from [employer]. Denies SI or HI.  Denies AH or VH. Denies self harm. Denies substance use.   Previous medication trials: Trazodone, Hydroxyzine, Zoloft, Remeron, Seroquel   PHQ2-9    Flowsheet Row Office Visit from 08/13/2021 in [facility] [place] Office Visit from 04/04/2021 in [facility] [place]  from 02/27/2021 in [facility] [employer] from 10/18/2020 in [facility] [place] Chronic Care Management from 07/16/2020 in [facility] [place]  PHQ-2 Total Score 0 0 0 0 0  PHQ-9 Total Score 0 -- 0 0 --      Flowsheet Row ED from 06/04/2021 in [facility]  C-SSRS RISK CATEGORY No Risk        Review of Systems:  Review of Systems  Musculoskeletal:  Negative for gait problem.  Neurological:  Negative for tremors.  Psychiatric/Behavioral:         Please refer to HPI    Medications: I have reviewed the patient's current medications.  Current Outpatient Medications  Medication Sig Dispense Refill   atorvastatin (LIPITOR) 40 MG tablet TAKE 1 TABLET(40 MG) BY MOUTH DAILY (Patient taking differently: Take 40 mg by mouth at bedtime.) 90 tablet 1   escitalopram (LEXAPRO) 10 MG tablet Take 1/2 tablet daily x 7 days, then increase to one tablet daily. 30 tablet 2   Glycerin-Hypromellose-PEG 400 (VISINE DRY EYE OP) Place 1 drop into both eyes daily as needed (dry eyes).     hydrochlorothiazide (MICROZIDE) 12.5 MG capsule TAKE 1 CAPSULE(12.5 MG) BY MOUTH DAILY (Patient taking differently: Take 12.5 mg by mouth daily.) 90 capsule 0   levocetirizine (XYZAL ALLERGY 24HR) 5 MG tablet Take 1 tablet (5 mg total) by mouth every evening. 90 tablet 1   losartan (COZAAR) 25 MG tablet Take 1 tablet (25 mg total) by mouth daily. TAKE 1 TABLET(25 MG) BY MOUTH daily 90 tablet 1   pantoprazole (PROTONIX) 40 MG tablet Take 1 tablet (40  mg total) by mouth daily. 90 tablet 0   Probiotic Product (PROBIOTIC DAILY PO) Take 1 capsule by mouth daily.     QUEtiapine (SEROQUEL) 25 MG tablet Take one to three tablets at bedtime. 90 tablet 2   traZODone (DESYREL) 50 MG tablet Take 2-3 tablets (100-150 mg total) by mouth at bedtime. 90 tablet 2   No current facility-administered medications for this visit.    Medication Side Effects:  None  Allergies:  Allergies  Allergen Reactions   Nexium [Esomeprazole] Diarrhea    Past Medical History:  Diagnosis Date   Depression    GERD (gastroesophageal reflux disease)    Hyperlipidemia    Hypertension     Past Medical History, Surgical history, Social history, and Family history were reviewed and updated as appropriate.   Please see review of systems for further details on the patient's review from today.   Objective:   Physical Exam:  There were no vitals taken for this visit.  Physical Exam Constitutional:      General: She is not in acute distress. Musculoskeletal:        General: No deformity.  Neurological:     Mental Status: She is alert and oriented to person, place, and time.     Coordination: Coordination normal.  Psychiatric:        Attention and Perception: Attention and perception normal. She does not perceive auditory or visual hallucinations.        Mood and Affect: Mood normal. Mood is not anxious or depressed. Affect is not labile, blunt, angry or inappropriate.        Speech: Speech normal.        Behavior: Behavior normal.        Thought Content: Thought content normal. Thought content is not paranoid or delusional. Thought content does not include homicidal or suicidal ideation. Thought content does not include homicidal or suicidal plan.        Cognition and Memory: Cognition and memory normal.        Judgment: Judgment normal.     Comments: Insight intact     Lab Review:     Component Value Date/Time   NA 140 08/13/2021 0905   K 4.6 08/13/2021 0905   CL 104 08/13/2021 0905   CO2 30 08/13/2021 0905   GLUCOSE 100 (H) 08/13/2021 0905   BUN 22 08/13/2021 0905   BUN 15 07/06/2019 0000   CREATININE 0.85 08/13/2021 0905   CALCIUM 9.3 08/13/2021 0905   PROT 6.8 08/13/2021 0905   ALBUMIN 4.0 08/13/2021 0905   AST 19 08/13/2021 0905   ALT 14 08/13/2021 0905   ALKPHOS 48 08/13/2021 0905   BILITOT 0.4 08/13/2021 0905   GFRNONAA >60  06/04/2021 1154       Component Value Date/Time   WBC 5.8 08/13/2021 0905   RBC 4.22 08/13/2021 0905   HGB 13.7 08/13/2021 0905   HCT 40.7 08/13/2021 0905   PLT 303.0 08/13/2021 0905   MCV 96.5 08/13/2021 0905   MCH 31.9 06/04/2021 1154   MCHC 33.7 08/13/2021 0905   RDW 13.8 08/13/2021 0905   LYMPHSABS 0.7 08/13/2021 0905   MONOABS 0.3 08/13/2021 0905   EOSABS 0.2 08/13/2021 0905   BASOSABS 0.1 08/13/2021 0905    No results found for: "POCLITH", "LITHIUM"   No results found for: "PHENYTOIN", "PHENOBARB", "VALPROATE", "CBMZ"   .res Assessment: Plan:   Plan:  PDMP reviewed  Seroquel 25mg  - 2 to 3 tablets at bedtime for sleep Trazadone 50mg  -  taking one to three tablets at bedtime for sleep Also taking Tylenol PM at bedtime - helped sleep through the night  Decrease Lexapro 10mg  to 5mg  daily for depression and anxiety   Discussed both Spravato and TMS as other treatment options.  RTC 4 weeks  Patient advised to contact office with any questions, adverse effects, or acute worsening in signs and symptoms.  Time spent with patient was 30 minutes. Greater than 50% of face to face time with patient was spent on counseling and coordination of care.    Discussed potential metabolic side effects associated with atypical antipsychotics, as well as potential risk for movement side effects. Advised pt to contact office if movement side effects occur.    Diagnoses and all orders for this visit:  Major depressive disorder, recurrent episode, moderate (HCC)  Insomnia, unspecified type  Generalized anxiety disorder     Please see After Visit Summary for patient specific instructions.  Future Appointments  Date Time Provider Department Center  10/28/2021  3:00 PM , PhD [facility]-CP None    No orders of the defined types were placed in this encounter.   -------------------------------

## 2021-10-22 ENCOUNTER — Encounter: Payer: Self-pay | Admitting: Cardiovascular Disease

## 2021-10-24 ENCOUNTER — Ambulatory Visit: Payer: Medicare PPO | Admitting: Adult Health

## 2021-10-24 ENCOUNTER — Telehealth: Payer: Self-pay | Admitting: Adult Health

## 2021-10-24 NOTE — Telephone Encounter (Signed)
Patient is asking for referral for ECT in [place].  I am not aware of a facility in [place]. She said she has had in the past at [facility], but said it is hard to get there due to the distance. I asked her about Spravato and TMS, as mentioned in your last visit and she said she knew that ECT works for her. She said she is terrified of anesthesia, which is why she asked about the other 2 treatments. She said she thinks she waited too long to seek treatment, she isn't sleeping or eating, but the medication is not helpful.

## 2021-10-24 NOTE — Telephone Encounter (Signed)
Pt called at 1:30p.  She said she thinks she needs you to find someone that can help her get ECT treatment.  Next appt 8/29

## 2021-10-24 NOTE — Telephone Encounter (Signed)
Have her call her insurance company to verify coverage on others in the area.

## 2021-10-25 NOTE — Telephone Encounter (Signed)
Jamie Gamble called this morning at 9:15 to report that she is going to try TMS.  She has set up a consultation at [facility] for Tuesday, 8/8. She will keep you updated on that treatment.  She also would like to discuss stopping the Lexapro.  Please call to discuss.

## 2021-10-25 NOTE — Telephone Encounter (Signed)
Patient has now contacted [facility] about doing TMS instead of pursuing ECT. She said a friend has noticed her depression/anxiety seems to be worse since starting Lexapro, especially since increasing the dose, and she would like to stop it. Does she need to do a wean, currently 10 mg.

## 2021-10-25 NOTE — Telephone Encounter (Signed)
If she has taken the 5mg  for a week, she can just stop it.

## 2021-10-25 NOTE — Telephone Encounter (Signed)
How many doses has she taken?

## 2021-10-25 NOTE — Telephone Encounter (Signed)
She has had a total of 3 weeks, but said she took 5 mg for one week, took 10 mg for one week, and then dropped back down to 5 mg for a week because she was too anxious on 5 mg.

## 2021-10-25 NOTE — Telephone Encounter (Signed)
Patient notified

## 2021-10-28 ENCOUNTER — Ambulatory Visit (INDEPENDENT_AMBULATORY_CARE_PROVIDER_SITE_OTHER): Payer: Medicare PPO | Admitting: Psychiatry

## 2021-10-28 ENCOUNTER — Telehealth: Payer: Self-pay | Admitting: Adult Health

## 2021-10-28 DIAGNOSIS — F411 Generalized anxiety disorder: Secondary | ICD-10-CM | POA: Diagnosis not present

## 2021-10-28 DIAGNOSIS — F331 Major depressive disorder, recurrent, moderate: Secondary | ICD-10-CM | POA: Diagnosis not present

## 2021-10-28 DIAGNOSIS — G4700 Insomnia, unspecified: Secondary | ICD-10-CM | POA: Diagnosis not present

## 2021-10-28 NOTE — Telephone Encounter (Signed)
Next visit is 11/19/21. Herschell Dimes is Jamie Gamble POA (POA is in her chart) and wants to know if Amitriptyline would be a good choice for her. [name] and her POA are seeing Mardelle Matte now. [name]'s phone number is [phone].
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
## 2021-10-28 NOTE — Telephone Encounter (Signed)
Patient notified that amitriptyline is not a good choice.

## 2021-11-04 ENCOUNTER — Telehealth: Payer: Self-pay | Admitting: Internal Medicine

## 2021-11-04 ENCOUNTER — Ambulatory Visit (INDEPENDENT_AMBULATORY_CARE_PROVIDER_SITE_OTHER): Payer: Medicare PPO | Admitting: Psychiatry

## 2021-11-04 DIAGNOSIS — F331 Major depressive disorder, recurrent, moderate: Secondary | ICD-10-CM | POA: Diagnosis not present

## 2021-11-04 DIAGNOSIS — F411 Generalized anxiety disorder: Secondary | ICD-10-CM

## 2021-11-04 DIAGNOSIS — F329 Major depressive disorder, single episode, unspecified: Secondary | ICD-10-CM | POA: Diagnosis not present

## 2021-11-04 DIAGNOSIS — G4700 Insomnia, unspecified: Secondary | ICD-10-CM | POA: Diagnosis not present

## 2021-11-04 DIAGNOSIS — F332 Major depressive disorder, recurrent severe without psychotic features: Secondary | ICD-10-CM | POA: Diagnosis not present

## 2021-11-04 MED ORDER — HYDROCHLOROTHIAZIDE 12.5 MG PO CAPS: 12.5000 mg | ORAL_CAPSULE | Freq: Every day | ORAL | 0 refills | Status: DC

## 2021-11-04 NOTE — Telephone Encounter (Signed)
Okay to order sleep study? 

## 2021-11-04 NOTE — Telephone Encounter (Signed)
Pt requesting refill of hydrochlorothiazide (MICROZIDE) 12.5 MG capsule  [facility] [place], [place] - [address] [place] Phone:  [phone]  Fax:  [phone]     Pt took home sleep study test, no OSA found., would like to request a sleep study.

## 2021-11-04 NOTE — Progress Notes (Signed)
Psychotherapy Progress Note [facility] Jamie Czar, PhD LP  Patient ID: Jamie Gamble [facility])    MRN: [id number] Therapy format: Individual psychotherapy Date: 11/04/2021      Start: 3:12p     Stop: 4:00p     Time Spent: 48 min Location: Telehealth visit -- I connected with this patient by an approved telecommunication method (audio only), with her informed consent, and verifying identity and patient privacy.  I was located at my office and patient at her home.  As needed, we discussed the limitations, risks, and security and privacy concerns associated with telehealth service, including the availability and conditions which currently govern in-person appointments and the possibility that 3rd-party payment may not be fully guaranteed and she may be responsible for charges.  After she indicated understanding, we proceeded with the session.  Also discussed treatment planning, as needed, including ongoing verbal agreement with the plan, the opportunity to ask and answer all questions, her demonstrated understanding of instructions, and her readiness to call the office should symptoms worsen or she feels she is in a crisis state and needs more immediate and tangible assistance.   Session narrative (presenting needs, interim history, self-report of stressors and symptoms, applications of prior therapy, status changes, and interventions made in session) Feeling identifiably better -- had 1st ECT treatment this morning.  Sounds considerably more fluent on the phone as well, compared to last week's 1st visit.  Was markedly anxious but was given something for blood pressure (or sedative).  In c/o a Jamie Gamble.  Was persuaded last week that ECT's benefit would be much stronger than TMS.  Benefit of hindsight, feels like she cut off ECT treatments early (10 instead of 12) this spring.  Hired caregiver took her this morning, has reliable transport for further treatment.  Aware right now of need to  revive her social support system more, she has some constrained time with Jamie Gamble.  Jamie Gamble is a Equities trader friend, once went out to lunch together.  She is already planned to come over and visit Wednesday afternoon.  Caregivers are on some schedule, not sure what.  Has daily caregivers programmed right now.  Caregiver experience is going pretty well.  The organization helping is called [employer], providing mainly companionship, and maybe making meals.    Jamie Gamble says they definitely picked up sleep apnea during her ECT treatments, but her mobile study was negative.  Resolved to ask Jamie Gamble for referral to sleep apnea assessment.  Failing that, can approach PCP, Jamie Gamble.    Brainstormed things she might do with a companion and a decently motivated day -- look into volunteer opportunities (e.g., the [employer]), look over courses she could take,   Sleep the last few nights may have been better, hard to say.  Trying to offer up her worries to God in prayer, surrender it to God.  Insight that she's worried about surrendering control, often enough.  The experience is more of free-floating anxiety.  In the mornings can tend to start up with worry about whether God was punishing her and that's why she's suffering so, even though she knows better.  Reinforced understanding of intrusive thoughts, the influence of depression filtered through old indoctrination.  Discussed the power of lighting to help set tone for wake and sleep, briefly probed possibilities.  Resolved to try to get herself outside for a few minutes in the morning as a tone-setter for the day.    Therapeutic modalities:  Cognitive Behavioral Therapy and Solution-Oriented/Positive Psychology  Mental Status/Observations:  Appearance:   Not assessed     Behavior:  Appropriate  Motor:  Not assessed  Speech/Language:   Clear and Coherent  Affect:  Not assessed  Mood:  Much improved energy and engagement,  still anxious but much less  Thought process:  normal  Thought content:    WNL  Sensory/Perceptual disturbances:    WNL  Orientation:  Fully oriented  Attention:  Good    Concentration:  Good  Memory:  WNL  Insight:    Good  Judgment:   Good  Impulse Control:  Good   Risk Assessment: Danger to Self: No Self-injurious Behavior: No Danger to Others: No Physical Aggression / Violence: No Duty to Warn: No Access to Firearms a concern: No  Assessment of progress:  progressing well  Diagnosis:   ICD-10-CM   1. Major depressive disorder, recurrent episode, moderate (HCC)  F33.1     2. Insomnia, unspecified type  G47.00     3. Generalized anxiety disorder  F41.1      Plan:  Continue ECT as engaged Maintain support system of friends and home care Develop activities of interest for days with companions Re-engage friends as planned Pursue updated sleep apnea assessment, given strong medical observation made Self-affirm God would not be punishing her, it's a temptation of depression  Start the day with good lighting and a few minutes grounding outdoors Other recommendations/advice as may be noted above Continue to utilize previously learned skills ad lib Maintain medication as prescribed and work faithfully with relevant prescriber(s) if any changes are desired or seem indicated Call the clinic on-call service, 988/hotline, 911, or present to [facility] or ER if any life-threatening psychiatric crisis Return for time as available. Already scheduled visit in this office 11/19/2021.  Jamie Fries, PhD Jamie Czar, PhD LP Clinical Psychologist, [facility] [address], [address] [place], [place] [address] [phone]

## 2021-11-06 ENCOUNTER — Encounter: Payer: Self-pay | Admitting: Adult Health

## 2021-11-06 DIAGNOSIS — F329 Major depressive disorder, single episode, unspecified: Secondary | ICD-10-CM | POA: Diagnosis not present

## 2021-11-06 DIAGNOSIS — F332 Major depressive disorder, recurrent severe without psychotic features: Secondary | ICD-10-CM | POA: Diagnosis not present

## 2021-11-06 NOTE — Telephone Encounter (Signed)
Patient saw Dr Ethelene Hal for ECT treatments.  She states that he saw [facility] during the treatment and ordered the home sleep study.  Please advise.

## 2021-11-06 NOTE — Telephone Encounter (Signed)
Left message on machine for patient to return our call 

## 2021-11-07 NOTE — Telephone Encounter (Signed)
Left message on machine for patient to return our call 

## 2021-11-07 NOTE — Telephone Encounter (Signed)
Patient is aware 

## 2021-11-08 ENCOUNTER — Ambulatory Visit: Payer: Medicare PPO | Admitting: Psychiatry

## 2021-11-11 DIAGNOSIS — F332 Major depressive disorder, recurrent severe without psychotic features: Secondary | ICD-10-CM | POA: Diagnosis not present

## 2021-11-11 DIAGNOSIS — F329 Major depressive disorder, single episode, unspecified: Secondary | ICD-10-CM | POA: Diagnosis not present

## 2021-11-13 DIAGNOSIS — F332 Major depressive disorder, recurrent severe without psychotic features: Secondary | ICD-10-CM | POA: Diagnosis not present

## 2021-11-13 DIAGNOSIS — F329 Major depressive disorder, single episode, unspecified: Secondary | ICD-10-CM | POA: Diagnosis not present

## 2021-11-18 DIAGNOSIS — F329 Major depressive disorder, single episode, unspecified: Secondary | ICD-10-CM | POA: Diagnosis not present

## 2021-11-18 DIAGNOSIS — F332 Major depressive disorder, recurrent severe without psychotic features: Secondary | ICD-10-CM | POA: Diagnosis not present

## 2021-11-19 ENCOUNTER — Encounter: Payer: Self-pay | Admitting: Adult Health

## 2021-11-19 ENCOUNTER — Ambulatory Visit (INDEPENDENT_AMBULATORY_CARE_PROVIDER_SITE_OTHER): Payer: Medicare PPO | Admitting: Adult Health

## 2021-11-19 DIAGNOSIS — G4700 Insomnia, unspecified: Secondary | ICD-10-CM

## 2021-11-19 DIAGNOSIS — F411 Generalized anxiety disorder: Secondary | ICD-10-CM

## 2021-11-19 DIAGNOSIS — F331 Major depressive disorder, recurrent, moderate: Secondary | ICD-10-CM

## 2021-11-19 MED ORDER — QUETIAPINE FUMARATE 100 MG PO TABS: ORAL_TABLET | ORAL | 2 refills | Status: DC

## 2021-11-19 NOTE — Progress Notes (Signed)
Jamie Gamble [id number] 06/06/1945 76 y.o.  Subjective:   Patient ID:  Jamie Gamble is a 76 y.o. (DOB Oct 08, 1945) female.  Chief Complaint: No chief complaint on file.   HPI  Jamie Gamble presents to the office today for follow-up of GAD, MDD and insomnia.  Previously seen by Dr Evelene Croon.   Seeing therapist - Dr. Farrel Demark in August.  Accompanied by friend.  Completed ECT #5 - going twice a week.  Describes mood today as "about the same". Pleasant. Denies tearfulness. Mood symptoms - reports depression and anxiety. Denies irritability. Reports some worry and rumination. Denies obsessive thoughts or acts. Mood is mostly down - "some improvement with ECT treatments". Her friend also notes an improvement with restarting the ECT treatments. Will receive ECT treatment 6 tomorrow. Has discontinued the Lexapro - does not want to restart further medications today - is willing to increase Seroquel dose from 75mg  to 100mg . Varying interest and motivation. Taking medications as prescribed.  Energy levels lower. Active, does not have a regular exercise routine.   Enjoys some usual interests and activities. Single. Lives alone. Not dating. No family local. Has a close friend local. Playing rummy. Attends church.  Appetite adequate. Weight gain - 160 pounds. Sleeps better well most nights - "it varies". Averages 6 hours.  Denies daytime napping. Focus and concentration "ok"- reports some memory loss from ECT. Completing some tasks. Managing aspects of household. Retired at age 74 from [employer]. Denies SI or HI.  Denies AH or VH. Denies self harm. Denies substance use.   Previous medication trials: Trazodone, Hydroxyzine, Zoloft, Remeron, Seroquel  PHQ2-9    Flowsheet Row Office Visit from 08/13/2021 in [facility] [place] Office Visit from 04/04/2021 in [facility] at 06/02/2021 from 02/27/2021 in [facility] at 14/09/2020 from 10/18/2020 in [facility] [place] [facility] from 07/16/2020 in [facility] [place]  PHQ-2 Total Score 0 0 0 0 0  PHQ-9 Total Score 0 -- 0 0 --      Flowsheet Row ED from 06/04/2021 in [facility]  C-SSRS RISK CATEGORY No Risk        Review of Systems:  Review of Systems  Musculoskeletal:  Negative for gait problem.  Neurological:  Negative for tremors.  Psychiatric/Behavioral:         Please refer to HPI    Medications: I have reviewed the patient's current medications.  Current Outpatient Medications  Medication Sig Dispense Refill   atorvastatin (LIPITOR) 40 MG tablet TAKE 1 TABLET(40 MG) BY MOUTH DAILY (Patient taking differently: Take 40 mg by mouth at bedtime.) 90 tablet 1   escitalopram (LEXAPRO) 10 MG tablet Take 1/2 tablet daily x 7 days, then increase to one tablet daily. 30 tablet 2   Glycerin-Hypromellose-PEG 400 (VISINE DRY EYE OP) Place 1 drop into both eyes daily as needed (dry eyes).     hydrochlorothiazide (MICROZIDE) 12.5 MG capsule Take 1 capsule (12.5 mg total) by mouth daily. TAKE 1 CAPSULE(12.5 MG) BY MOUTH DAily 90 capsule 0   levocetirizine (XYZAL ALLERGY 24HR) 5 MG tablet Take 1 tablet (5 mg total) by mouth every evening. 90 tablet 1   losartan (COZAAR) 25 MG tablet Take 1 tablet (25 mg total) by mouth daily. TAKE 1 TABLET(25 MG) BY MOUTH daily 90 tablet 1   pantoprazole (PROTONIX) 40 MG tablet Take 1 tablet (40 mg total) by mouth daily. 90 tablet 0   Probiotic Product (PROBIOTIC  DAILY PO) Take 1 capsule by mouth daily.     QUEtiapine (SEROQUEL) 100 MG tablet Take one to three tablets at bedtime. 30 tablet 2   traZODone (DESYREL) 50 MG tablet Take 2-3 tablets (100-150 mg total) by mouth at bedtime. 90 tablet 2   No current facility-administered medications for this visit.    Medication Side Effects: None  Allergies:  Allergies  Allergen Reactions   Nexium [Esomeprazole] Diarrhea    Past Medical  History:  Diagnosis Date   Depression    GERD (gastroesophageal reflux disease)    Hyperlipidemia    Hypertension     Past Medical History, Surgical history, Social history, and Family history were reviewed and updated as appropriate.   Please see review of systems for further details on the patient's review from today.   Objective:   Physical Exam:  There were no vitals taken for this visit.  Physical Exam Constitutional:      General: She is not in acute distress. Musculoskeletal:        General: No deformity.  Neurological:     Mental Status: She is alert and oriented to person, place, and time.     Coordination: Coordination normal.  Psychiatric:        Attention and Perception: Attention and perception normal. She does not perceive auditory or visual hallucinations.        Mood and Affect: Mood normal. Mood is not anxious or depressed. Affect is not labile, blunt, angry or inappropriate.        Speech: Speech normal.        Behavior: Behavior normal.        Thought Content: Thought content normal. Thought content is not paranoid or delusional. Thought content does not include homicidal or suicidal ideation. Thought content does not include homicidal or suicidal plan.        Cognition and Memory: Cognition and memory normal.        Judgment: Judgment normal.     Comments: Insight intact     Lab Review:     Component Value Date/Time   NA 140 08/13/2021 0905   K 4.6 08/13/2021 0905   CL 104 08/13/2021 0905   CO2 30 08/13/2021 0905   GLUCOSE 100 (H) 08/13/2021 0905   BUN 22 08/13/2021 0905   BUN 15 07/06/2019 0000   CREATININE 0.85 08/13/2021 0905   CALCIUM 9.3 08/13/2021 0905   PROT 6.8 08/13/2021 0905   ALBUMIN 4.0 08/13/2021 0905   AST 19 08/13/2021 0905   ALT 14 08/13/2021 0905   ALKPHOS 48 08/13/2021 0905   BILITOT 0.4 08/13/2021 0905   GFRNONAA >60 06/04/2021 1154       Component Value Date/Time   WBC 5.8 08/13/2021 0905   RBC 4.22 08/13/2021 0905    HGB 13.7 08/13/2021 0905   HCT 40.7 08/13/2021 0905   PLT 303.0 08/13/2021 0905   MCV 96.5 08/13/2021 0905   MCH 31.9 06/04/2021 1154   MCHC 33.7 08/13/2021 0905   RDW 13.8 08/13/2021 0905   LYMPHSABS 0.7 08/13/2021 0905   MONOABS 0.3 08/13/2021 0905   EOSABS 0.2 08/13/2021 0905   BASOSABS 0.1 08/13/2021 0905    No results found for: "POCLITH", "LITHIUM"   No results found for: "PHENYTOIN", "PHENOBARB", "VALPROATE", "CBMZ"   .res Assessment: Plan:    Plan:  Seeing [name] for therapy - 2 visits.  PDMP reviewed  Continue  Increase Seroquel 75mg  to 100mg  at bedtime for sleep Trazadone 50mg  - taking one  to three tablets at bedtime for sleep Also taking Tylenol PM at bedtime - helped sleep through the nigh  Discussed both Spravato and TMS as other treatment options.  RTC 3 weeks  Patient advised to contact office with any questions, adverse effects, or acute worsening in signs and symptoms.  Time spent with patient was 30 minutes. Greater than 50% of face to face time with patient was spent on counseling and coordination of care.    Discussed potential metabolic side effects associated with atypical antipsychotics, as well as potential risk for movement side effects. Advised pt to contact office if movement side effects occur.     Diagnoses and all orders for this visit:  Major depressive disorder, recurrent episode, moderate (HCC)  Insomnia, unspecified type  Generalized anxiety disorder  Other orders -     QUEtiapine (SEROQUEL) 100 MG tablet; Take one to three tablets at bedtime.     Please see After Visit Summary for patient specific instructions.  Future Appointments  Date Time Provider Department Center  11/28/2021  2:00 PM Robley Fries, PhD CP-CP None  12/05/2021 10:00 AM Robley Fries, PhD CP-CP None  12/10/2021  8:40 AM Aeron Lheureux, Thereasa Solo, NP CP-CP None  12/23/2021  2:00 PM Robley Fries, PhD CP-CP None  12/30/2021 10:00 AM Robley Fries, PhD CP-CP None  01/16/2022  1:00 PM Robley Fries, PhD CP-CP None  01/29/2022  2:00 PM Robley Fries, PhD CP-CP None    No orders of the defined types were placed in this encounter.   -------------------------------

## 2021-11-20 ENCOUNTER — Telehealth: Payer: Self-pay | Admitting: Adult Health

## 2021-11-20 DIAGNOSIS — F329 Major depressive disorder, single episode, unspecified: Secondary | ICD-10-CM | POA: Diagnosis not present

## 2021-11-20 DIAGNOSIS — F332 Major depressive disorder, recurrent severe without psychotic features: Secondary | ICD-10-CM | POA: Diagnosis not present

## 2021-11-20 NOTE — Telephone Encounter (Signed)
Next visit is 12/10/21. Azayla left a message about having a question on her refills on her medication. Number is [phone].

## 2021-11-20 NOTE — Telephone Encounter (Signed)
Spoke to pt,a new rx was sent yesterday she had a question about the dose.She will call back if she has more questions

## 2021-11-21 ENCOUNTER — Telehealth: Payer: Self-pay | Admitting: Internal Medicine

## 2021-11-21 MED ORDER — ATORVASTATIN CALCIUM 40 MG PO TABS: 40.0000 mg | ORAL_TABLET | Freq: Every day | ORAL | 1 refills | Status: DC

## 2021-11-21 NOTE — Telephone Encounter (Signed)
Refill sent.

## 2021-11-21 NOTE — Telephone Encounter (Signed)
Pt call and stated she need a refill on atorvastatin (LIPITOR) 40 MG tablet  sent to  [facility] [place], [place] - [address] [facility] [place] Phone:  [phone]  Fax:  [phone]

## 2021-11-26 ENCOUNTER — Telehealth: Payer: Self-pay | Admitting: Adult Health

## 2021-11-27 DIAGNOSIS — F332 Major depressive disorder, recurrent severe without psychotic features: Secondary | ICD-10-CM | POA: Diagnosis not present

## 2021-11-27 DIAGNOSIS — F329 Major depressive disorder, single episode, unspecified: Secondary | ICD-10-CM | POA: Diagnosis not present

## 2021-11-28 ENCOUNTER — Ambulatory Visit (INDEPENDENT_AMBULATORY_CARE_PROVIDER_SITE_OTHER): Payer: Medicare PPO | Admitting: Psychiatry

## 2021-11-28 DIAGNOSIS — F331 Major depressive disorder, recurrent, moderate: Secondary | ICD-10-CM | POA: Diagnosis not present

## 2021-11-28 DIAGNOSIS — F411 Generalized anxiety disorder: Secondary | ICD-10-CM

## 2021-11-28 DIAGNOSIS — Z741 Need for assistance with personal care: Secondary | ICD-10-CM

## 2021-11-28 DIAGNOSIS — G4700 Insomnia, unspecified: Secondary | ICD-10-CM

## 2021-11-28 NOTE — Progress Notes (Unsigned)
Psychotherapy Progress Note [facility] [name], PhD LP  Patient ID: RAINELLE SULEWSKI [facility])    MRN: [id number] Therapy format: Individual psychotherapy Date: 11/28/2021      Start: 2:18p     Stop: 3:05p     Time Spent: 47 min Location: In-person   Session narrative (presenting needs, interim history, self-report of stressors and symptoms, applications of prior therapy, status changes, and interventions made in session) Here with friend and POA, Programmer, applications.  Now 7 ECT treatments in, with 2 more scheduled during next 4 wks.  Home care workers continue, no longer 24/7, currently down to transportation and supervision for 2 days of ECT treatment and lighter companionship on the off days.  Still being served by [employer], pleased enough with their services.  Prospect of going leaner on the schedule as she recovers further.    Administered PHQ9, score 7.  Possible difficulty gauging answers, but overall credible for receeding depression.  Still pale in the face but more responsive affect, able to smile, laugh a bit, more ready speech.  Affirmed signs of recovery in progress.  Gets anxious for ECT treatments, routinely receiving tranquilizer.  Sleeping some each night, going to sleep readily except for pre-treatment night, when she is sleeping diminished hours, tossing and turning.  Affirmed her dedication and courage going in anyway.  Noted that even if her remaining treatments are few, she could benefit from coping skills.  Says she does end up reminding herself it's only 15 minutes once she's there, but it pretty well takes until she's committed and rolling in.  Trusts her team, and likes the companionship of her care worker getting to and into the center.  Explored options for social support and more knowing handling of her anxiety, but the room is very crowded.  Oriented her to soothing imagery as something she could do for herself, framing it as a mental escape while  successfully submitting the body.  Settled on imagining a pleasant mountain landscape and the spiritual presence of loved ones and soothing people, as if a circle of angels round her, holding her safe and cared for.  Likes the image, agrees to use.  Suggested for DFA as well, to bring up same for soothing to sleep.  Worry over expenses -- in [place], which is raising rent 9%, feels intimidating.  Worried about finances, wants to know how to assess her finances.  Reaffirmed that she has [name] available, who has become more familiar with her assets and finances and holds POA/HCPOA.  Explored situation, learned Lynsie received SS, state pension payment, and a stipend of some amount from a trust, which is still technically an estate until a prenup-ordered $2000/mo maintenance payment to her stepM ends.  Brother was the trustee, now nephew, who has been noted hesitant to provide information.  Has been some opinion (niece) that she ought to be in assisted living, but that seems rash and unrealistic given costs and Creta's noticeable recovery.  Resolved to meet with a financial advisor, which [name] can provide through her work at [employer].  Confirmed no pressure sales tactics would be involved, and [name] will participate in fact-finding and understanding.  One concern, possibly, that the trust becomes inheritance for the family member(s) operating it if designated care recipients do not use the whole.  Potential conflict of interest for trustee, if true, but a trusted party at this time.  Therapeutic modalities: Cognitive Behavioral Therapy, Solution-Oriented/Positive Psychology, Ego-Supportive, and Faith-sensitive  Mental Status/Observations:  Appearance:   Casual, Neat, and pale      Behavior:  Appropriate  Motor:  Normal and less tentative  Speech/Language:   Clear and Coherent and somewhat more productive  Affect:  Appropriate and less constricted  Mood:  anxious  Thought process:  normal  Thought content:    worry   Sensory/Perceptual disturbances:    WNL  Orientation:  Fully oriented  Attention:  Good    Concentration:  Good  Memory:  WNL  Insight:    Good  Judgment:   Good  Impulse Control:  Good   Risk Assessment: Danger to Self: No Self-injurious Behavior: No Danger to Others: No Physical Aggression / Violence: No Duty to Warn: No Access to Firearms a concern: No  Assessment of progress:  progressing well  Diagnosis:   ICD-10-CM   1. Major depressive disorder, recurrent episode, moderate (HCC)  F33.1     2. Insomnia, unspecified type  G47.00     3. Generalized anxiety disorder  F41.1     4. Dependent for money management  Z74.1      Plan:  ECT -- Continue as engaged.  Use imagery to self-soothe for pretreatment anxiety. Sleep -- Use soothing imagery for DFA.  Pursue updated sleep apnea assessment, given strong medical observation made Financial anxiety --  Support system -- Maintain system of friends and home care, may wean cautiously from home care as able, with [name]'s judgment.  Re-engage friends as planned. Activities -- Develop activities of interest, for days with and without companions.   Intrusive thoughts -- Self-affirm God would not be punishing her, it's a trick of depression Physiological mood factors -- Start the day with good lighting and a few minutes grounding outdoors.  May need to check vitamin levels and anemia. Financial concerns -- Follow through meeting with advisor available through [name], and seek greater clarity on the outlook for monies in trust. Placement concerns -- Independent living with part-time assistance and supervision should be fine at this time.  Available on request to interpret to family members and hear out concerns, if they remain. Other recommendations/advice as may be noted above Continue to utilize previously learned skills ad lib Maintain medication as prescribed and work faithfully with relevant prescriber(s) if any changes are desired or  seem indicated Call the clinic on-call service, 988/hotline, 911, or present to [facility] or ER if any life-threatening psychiatric crisis Return for session(s) already scheduled. Already scheduled visit in this office 12/05/2021.  Robley Fries, PhD [name], PhD LP Clinical Psychologist, [facility] [address], [address] [place], [place] [address] [phone]

## 2021-12-04 ENCOUNTER — Telehealth: Payer: Self-pay | Admitting: Adult Health

## 2021-12-04 ENCOUNTER — Telehealth: Payer: Self-pay | Admitting: Internal Medicine

## 2021-12-04 DIAGNOSIS — G4730 Sleep apnea, unspecified: Secondary | ICD-10-CM

## 2021-12-04 NOTE — Telephone Encounter (Signed)
Pt informed

## 2021-12-04 NOTE — Telephone Encounter (Signed)
Jamie Gamble called today requesting a referral for a sleep study.  She feels she has sleep apnea.  Her PCP, Dr. Ethelene Hal ordered a home sleep study for her, but that study didn't show any sleep apnea.  She feels she needs to have an in lab study done to have more accurate results.  She is requesting the referral be made to one of the sleep labs in [place].

## 2021-12-04 NOTE — Telephone Encounter (Signed)
Please review

## 2021-12-04 NOTE — Telephone Encounter (Signed)
Requesting a referral for an on site sleep study. The home sleep study was inconclusive

## 2021-12-04 NOTE — Telephone Encounter (Signed)
Will need to request that from her PCP.

## 2021-12-04 NOTE — Telephone Encounter (Signed)
Spoke with patient and she does not see anyone at [facility] Neuro or Pulmonology

## 2021-12-05 ENCOUNTER — Ambulatory Visit (INDEPENDENT_AMBULATORY_CARE_PROVIDER_SITE_OTHER): Payer: Medicare PPO | Admitting: Psychiatry

## 2021-12-05 DIAGNOSIS — F332 Major depressive disorder, recurrent severe without psychotic features: Secondary | ICD-10-CM | POA: Diagnosis not present

## 2021-12-05 DIAGNOSIS — F411 Generalized anxiety disorder: Secondary | ICD-10-CM

## 2021-12-05 DIAGNOSIS — R69 Illness, unspecified: Secondary | ICD-10-CM

## 2021-12-05 NOTE — Telephone Encounter (Signed)
Pt says she does not want to go back to the original and does not want to go to [place] to see them. Says the provider in [place] "isn't licensed to order a sleep study in [place]"

## 2021-12-05 NOTE — Telephone Encounter (Signed)
Referral placed.

## 2021-12-05 NOTE — Progress Notes (Unsigned)
Psychotherapy Progress Note [facility] [name], PhD LP  Patient ID: Jamie Gamble [facility])    MRN: [id number] Therapy format: Individual psychotherapy Date: 12/05/2021      Start: 10:12a     Stop: 11:00a     Time Spent: 48 min Location: In-person.    Session narrative (presenting needs, interim history, self-report of stressors and symptoms, applications of prior therapy, status changes, and interventions made in session) Seen with friend/POA [name].    Next ECT next week, but sleep fragmented again.  Suspects sleep apnea, is pursuing referral to sleep assessment, says she's waiting on her doctor to advise her but not entirely sure about the process.  Suspect confused messaging, consulted EHR, noticed internal messaging indicating PCP thinks another service has ordered it.  Advised about garbled messaging, encouraged to call her PCP's office to clarify that she is seeking, on psychiatry recommendation, and would like direction how to proceed (refer directly, or refer her to appropriate specialist to mae the study referral.  Briefly educated on possibility of doing at-home test again, and the need to be sure it's connected correctly to get valid results.  Meanwhile, feeling more fatigued, and concerned for whether depression will relapse.  Interpreted sleep deprivation as the cause of fatigue and a primary need.  Encouraged in using mental inventory technique if she notices worry interfering with going to sleep -- write what's on her mind and either get up and think or lie down and table the note.  Has not had any paid companions this week, weaning some support.  Did have a cleaner come in, socialized for lunch, and had Tuesday outing to church.  Probed level of social stimulation this week, feels enough in touch with people.    Meeting set tomorrow for financial advisor.  Cordial working relationship remains between El Mangi and her trustee nephew Fraser Din, and there seem to be  enough facts about funding and how it will go to be able to do financial planning.  However, Massiel is concerned that her former SIL Valera Castle has too much control over [place].  Difficult for Thia to parse the question whether she trusts Fraser Din to resist undue influence and to recognize his contractual duties, only able to say she doesn't trust Valera Castle.  Ann herself has experienced something of a bum's rush from Valera Castle and her daughter Morrie Sheldon, very much reinforced the perception that Valera Castle is digging gold or control, Morrie Sheldon is her protege, and Kately is not really welcome back in [place], ostensibly because she is associated with her brother, who shot himself.  For his part, Fraser Din noted to have become very relieved to learn about cost-cutting and seems clear enough that Dayanis doesn't need full-scale assisted living.  Confirmed that Nyanna does trust [name]'s abilities to resist manipulation, and represent her status, her needs, and her contractual entitlement to [place] as needed.  Agreed boundaries and process, mainly  that [name] and [place] confer without interference of other family members not named in the Counselling psychologist.  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  Agreed, and suggested for now that she start by noticing and giving herself credit when she does anything constructive or principled for things that matter to her wellbeing or her sense of purpose.  Therapeutic modalities: Cognitive Behavioral  Therapy, Solution-Oriented/Positive Psychology, Ego-Supportive, and Faith-sensitive  Mental Status/Observations:  Appearance:   Casual and Neat, less pale  Behavior:  Appropriate  Motor:  Normal  Speech/Language:   Clear and Coherent  Affect:  Appropriate and a little tentative   Mood:  anxious and dysthymic  Thought process:  normal, mild perseveration/rigidity  Thought content:    worry  Sensory/Perceptual disturbances:    WNL  Orientation:  Fully oriented  Attention:  Good    Concentration:  Good  Memory:  grossly intact  Insight:    Good  Judgment:   Good  Impulse Control:  Good   Risk Assessment: Danger to Self: No Self-injurious Behavior: No Danger to Others: No Physical Aggression / Violence: No Duty to Warn: No Access to Firearms a concern: No  Assessment of progress:  progressing  Diagnosis:   ICD-10-CM   1. Severe episode of recurrent major depressive disorder, without psychotic features (HCC)  F33.2    improving significantly with ECT    2. Generalized anxiety disorder  F41.1     3. r/o sleep apnea  R69      Plan:  ECT -- Continue as engaged.  Practice pleasant imagery and self-reminders it's temporary for pretreatment anxiety. Sleep -- Use soothing imagery for DFA.  Practice sleep hygiene, with comfortable arrangements and lights and any caffeine wound down ahead of bedtime.  For worries, write down worry thoughts and consciously decide whether to stay up and think or lay them down and sleep.  Pursue updated sleep apnea assessment, starting with clarifying request to PCP. Physiological mood factors -- Start the day with good lighting and a few minutes grounding outdoors.  May need to check vitamin levels and anemia. Financial concerns -- Follow through meeting with advisor available through [name], and seek greater clarity on the outlook for monies in trust. Support system -- Maintain system of friends and home care, may wean cautiously from home care as able, within [name]'s judgment.  Re-engage friends as planned. Activities -- Develop activities of interest, for days with and without companions.   Intrusive thoughts -- Self-affirm God would not be punishing her, it's a trick of depression Placement concerns -- Independent living with  part-time assistance and supervision should be fine at this time.  Available on request to interpret to family members and hear out concerns, if they remain. Self-esteem -- Readily notice and give self credit when she does anything constructive or principled for things that matter to her wellbeing or sense of purpose Other recommendations/advice as may be noted above Continue to utilize previously learned skills ad lib Maintain medication as prescribed and work faithfully with relevant prescriber(s) if any changes are desired or seem indicated Call the clinic on-call service, 988/hotline, 911, or present to [facility] or ER if any life-threatening psychiatric crisis Return for session(s) already scheduled. Already scheduled visit in this office 12/10/2021.  Robley Fries, PhD [name], PhD LP Clinical Psychologist, [facility] [address], [address] [place], [place] [address] [phone]

## 2021-12-10 ENCOUNTER — Ambulatory Visit (INDEPENDENT_AMBULATORY_CARE_PROVIDER_SITE_OTHER): Payer: Medicare PPO | Admitting: Adult Health

## 2021-12-10 ENCOUNTER — Encounter: Payer: Self-pay | Admitting: Adult Health

## 2021-12-10 DIAGNOSIS — F411 Generalized anxiety disorder: Secondary | ICD-10-CM

## 2021-12-10 DIAGNOSIS — G4700 Insomnia, unspecified: Secondary | ICD-10-CM | POA: Diagnosis not present

## 2021-12-10 DIAGNOSIS — F332 Major depressive disorder, recurrent severe without psychotic features: Secondary | ICD-10-CM

## 2021-12-10 NOTE — Progress Notes (Signed)
Jamie Gamble [id number] [birth year] 76 y.o.  Subjective:   Patient ID:  Jamie Gamble is a 76 y.o. (DOB 1945-04-23) female.  Chief Complaint: No chief complaint on file.   HPI Jamie Gamble presents to the office today for follow-up of GAD, MDD and insomnia.  Previously seen by Dr Toy Care.   Seeing therapist - Dr. Rica Mote in August.  Accompanied by friend.  Receiving ECT  Describes mood today as "about the same". Pleasant. Denies tearfulness. Mood symptoms - reports depression and anxiety. Denies irritability. Reports some worry and rumination. Mood is mostly low. Feels like the ECT treatments have been helpful for mood improvement. Continues to struggle with sleep - currently taking 100mg  of Seroquel, 150mg  of Trazadone along with 50mg  of Benadryl with an unsuccessful sleep pattern. Reports she did not sleep at all last night.  Reporting an in home sleep study that was inconclusive. Is awaiting an appointment for in office  testing. Varying interest and motivation. Taking medications as prescribed.  Energy levels lower. Active, does not have a regular exercise routine.   Enjoys some usual interests and activities. Single. Lives alone. Not dating. No family local. Spending time with        friends. Attends church.  Appetite adequate. Weight gain - 160 pounds. Sleeping difficulties. Did not sleep last night. Denies daytime napping. Focus and concentration "fair"- memory loss from ECT. Completing some tasks. Managing aspects of household. Retired at age 82 from [employer]. Denies SI or HI. No feelings of peace or security. Denies AH or VH. Denies self harm. Denies substance use.   Previous medication trials: Trazodone, Hydroxyzine, Zoloft, Remeron, Seroquel   PHQ2-9    Flowsheet Row Counselor from 11/28/2021 in [place] Visit from 08/13/2021 in [place] [facility] [place] from 04/04/2021 in [place] at [place] from 02/27/2021 in  [place] at [facility] from 10/18/2020 in [place] at [place]  PHQ-2 Total Score 2 0 0 0 0  PHQ-9 Total Score 7 0 -- 0 0      [place] [facility] from 06/04/2021 in [place] DEPT  C-SSRS RISK CATEGORY No Risk        Review of Systems:  Review of Systems  Musculoskeletal:  Negative for gait problem.  Neurological:  Negative for tremors.  Psychiatric/Behavioral:         Please refer to HPI    Medications: I have reviewed the patient's current medications.  Current Outpatient Medications  Medication Sig Dispense Refill   atorvastatin (LIPITOR) 40 MG tablet Take 1 tablet (40 mg total) by mouth at bedtime. 90 tablet 1   escitalopram (LEXAPRO) 10 MG tablet Take 1/2 tablet daily x 7 days, then increase to one tablet daily. 30 tablet 2   Glycerin-Hypromellose-PEG 400 (VISINE DRY EYE OP) Place 1 drop into both eyes daily as needed (dry eyes).     hydrochlorothiazide (MICROZIDE) 12.5 MG capsule Take 1 capsule (12.5 mg total) by mouth daily. TAKE 1 CAPSULE(12.5 MG) BY MOUTH DAily 90 capsule 0   levocetirizine (XYZAL ALLERGY 24HR) 5 MG tablet Take 1 tablet (5 mg total) by mouth every evening. 90 tablet 1   losartan (COZAAR) 25 MG tablet Take 1 tablet (25 mg total) by mouth daily. TAKE 1 TABLET(25 MG) BY MOUTH daily 90 tablet 1   pantoprazole (PROTONIX) 40 MG tablet Take 1 tablet (40 mg total) by mouth daily. 90 tablet 0   Probiotic Product (PROBIOTIC DAILY PO) Take  1 capsule by mouth daily.     QUEtiapine (SEROQUEL) 100 MG tablet Take one to three tablets at bedtime. 30 tablet 2   traZODone (DESYREL) 50 MG tablet Take 2-3 tablets (100-150 mg total) by mouth at bedtime. 90 tablet 2   No current facility-administered medications for this visit.    Medication Side Effects: None  Allergies:  Allergies  Allergen Reactions   Nexium [Esomeprazole] Diarrhea    Past Medical History:  Diagnosis Date   Depression    GERD  (gastroesophageal reflux disease)    Hyperlipidemia    Hypertension     Past Medical History, Surgical history, Social history, and Family history were reviewed and updated as appropriate.   Please see review of systems for further details on the patient's review from today.   Objective:   Physical Exam:  There were no vitals taken for this visit.  Physical Exam Constitutional:      General: She is not in acute distress. Musculoskeletal:        General: No deformity.  Neurological:     Mental Status: She is alert and oriented to person, place, and time.     Coordination: Coordination normal.  Psychiatric:        Attention and Perception: Attention and perception normal. She does not perceive auditory or visual hallucinations.        Mood and Affect: Mood normal. Mood is not anxious or depressed. Affect is not labile, blunt, angry or inappropriate.        Speech: Speech normal.        Behavior: Behavior normal.        Thought Content: Thought content normal. Thought content is not paranoid or delusional. Thought content does not include homicidal or suicidal ideation. Thought content does not include homicidal or suicidal plan.        Cognition and Memory: Cognition and memory normal.        Judgment: Judgment normal.     Comments: Insight intact     Lab Review:     Component Value Date/Time   NA 140 08/13/2021 0905   K 4.6 08/13/2021 0905   CL 104 08/13/2021 0905   CO2 30 08/13/2021 0905   GLUCOSE 100 (H) 08/13/2021 0905   BUN 22 08/13/2021 0905   BUN 15 07/06/2019 0000   CREATININE 0.85 08/13/2021 0905   CALCIUM 9.3 08/13/2021 0905   PROT 6.8 08/13/2021 0905   ALBUMIN 4.0 08/13/2021 0905   AST 19 08/13/2021 0905   ALT 14 08/13/2021 0905   ALKPHOS 48 08/13/2021 0905   BILITOT 0.4 08/13/2021 0905   GFRNONAA >60 06/04/2021 1154       Component Value Date/Time   WBC 5.8 08/13/2021 0905   RBC 4.22 08/13/2021 0905   HGB 13.7 08/13/2021 0905   HCT 40.7 08/13/2021  0905   PLT 303.0 08/13/2021 0905   MCV 96.5 08/13/2021 0905   MCH 31.9 06/04/2021 1154   MCHC 33.7 08/13/2021 0905   RDW 13.8 08/13/2021 0905   LYMPHSABS 0.7 08/13/2021 0905   MONOABS 0.3 08/13/2021 0905   EOSABS 0.2 08/13/2021 0905   BASOSABS 0.1 08/13/2021 0905    No results found for: "POCLITH", "LITHIUM"   No results found for: "PHENYTOIN", "PHENOBARB", "VALPROATE", "CBMZ"   .res Assessment: Plan:    Plan:  Seeing [name] for therapy - 2 visits.  PDMP reviewed  Continue:  Seroquel 100mg  - 1 to 3 tablets  bedtime for sleep - taking one tablet currently -  plans to increase to 150mg  daily. Trazadone 50mg  - taking one to three tablets at bedtime for sleep  Also taking Tylenol PM 50mg  at bedtime   Currently receiving [facility] at [facility].  Discussed an in office sleep evaluation - awaiting a return call from [facility] - will also call and inquire about the testing.  RTC 2 weeks  Patient advised to contact office with any questions, adverse effects, or acute worsening in signs and symptoms.  Time spent with patient was 30 minutes. Greater than 50% of face to face time with patient was spent on counseling and coordination of care.    Discussed potential metabolic side effects associated with atypical antipsychotics, as well as potential risk for movement side effects. Advised pt to contact office if movement side effects occur.   There are no diagnoses linked to this encounter.   Please see After Visit Summary for patient specific instructions.  Future Appointments  Date Time Provider Department Center  12/19/2021  2:10 PM [facility]  12/23/2021  2:00 PM [facility], PhD [facility] None  12/24/2021  2:00 PM Billi Bright, 02/22/2022, NP CP-CP None  01/16/2022  1:00 PM 02/23/2022, PhD CP-CP None  01/29/2022  2:00 PM 01/18/2022, PhD CP-CP None  02/12/2022  2:00 PM 13/10/2021, PhD CP-CP None  02/26/2022  2:00 PM 02/14/2022, PhD CP-CP None  03/12/2022  2:00 PM 14/08/2021, PhD CP-CP None    No orders of the defined types were placed in this encounter.   -------------------------------

## 2021-12-11 DIAGNOSIS — F329 Major depressive disorder, single episode, unspecified: Secondary | ICD-10-CM | POA: Diagnosis not present

## 2021-12-11 DIAGNOSIS — F332 Major depressive disorder, recurrent severe without psychotic features: Secondary | ICD-10-CM | POA: Diagnosis not present

## 2021-12-12 ENCOUNTER — Telehealth: Payer: Self-pay | Admitting: Adult Health

## 2021-12-12 ENCOUNTER — Other Ambulatory Visit: Payer: Self-pay

## 2021-12-12 MED ORDER — QUETIAPINE FUMARATE 100 MG PO TABS: ORAL_TABLET | ORAL | 0 refills | Status: DC

## 2021-12-12 NOTE — Telephone Encounter (Signed)
error 

## 2021-12-12 NOTE — Telephone Encounter (Signed)
Pt called at 10:35a.  She said [facility] is trying to give her Seroquel 25 mg instead of the 100 mg that was prescribed.  Pls call them asap as she relies on them to deliver the meds to her and depending on when they get this fixed, she may miss another day's delivery.  Next appt 10/3

## 2021-12-12 NOTE — Telephone Encounter (Signed)
Spoke to pharmacy and fixed rx

## 2021-12-19 ENCOUNTER — Ambulatory Visit (INDEPENDENT_AMBULATORY_CARE_PROVIDER_SITE_OTHER): Payer: Medicare PPO | Admitting: *Deleted

## 2021-12-19 DIAGNOSIS — Z23 Encounter for immunization: Secondary | ICD-10-CM

## 2021-12-23 ENCOUNTER — Ambulatory Visit: Payer: Medicare PPO | Admitting: Psychiatry

## 2021-12-23 DIAGNOSIS — F332 Major depressive disorder, recurrent severe without psychotic features: Secondary | ICD-10-CM

## 2021-12-23 DIAGNOSIS — R69 Illness, unspecified: Secondary | ICD-10-CM | POA: Diagnosis not present

## 2021-12-23 DIAGNOSIS — G4700 Insomnia, unspecified: Secondary | ICD-10-CM

## 2021-12-23 NOTE — Progress Notes (Signed)
Psychotherapy Progress Note [facility] Jamie Czar, PhD LP  Patient ID: Jamie Gamble [facility])    MRN: [id number] Therapy format: Individual psychotherapy Date: 12/23/2021      Start: 2:10p     Stop: 3:02p     Time Spent: 52 min Location: In-person   Session narrative (presenting needs, interim history, self-report of stressors and symptoms, applications of prior therapy, status changes, and interventions made in session) Seen with friend/POA Jamie Gamble.  Per EHR, last ECT 9/20 with next one expected next week.  Last med check 2 wks ago, recommended increase Seroquel for sleep reinforcement.  Sleep study may be authorized via neurology or psychiatry.  F/u tomorrow.  Feeling alright today, slept "halfway decent" last night, est 6-7 hrs.  Feels higher dose Seroquel is working for that.  Confirms ECT is scheduled Wednesday, but really wants to stop.  Still makes her fearful but following through, and largely dislikes the memory loss and feeling like an object during the procedure and personnel involved.  Interested in finding a part-time job, [name]  Financial advisor did an analysis for 20 years, assuming no exorbitant medical costs and sM living and drawing 7 yrs off the trust.  Jamie Gamble says some details yet to obtain to better check mathematical assumptions.  Jamie Gamble feels nearly ready to ask, on Jamie Gamble's behalf, what provisions there are beyond the trust -- e.g., did her brothers leave any funds? Do niece/nephew plan to offer any support if she turns out tin need?  Says Jamie Gamble has been proactive seeking her own income.  No car, which does limit her possibilities.  She would like to work for [employer], but can't commute.  She lives near [facility], which is walkable to many things, including several offices.    Says her interest in a sleep study did go to referral to [facility], but they turned her down.  [name] it has to be 3 years since her last study.  Apparently  not enough information was provided to justify retesting, given the suspicion that her home test was spoiled, and ECT physician's observation that she had apneas while under.   Shifting to self-esteem, she does still harbor thoughts of being unworthy.  She does try to integrate into church activities but finds herself feeling marginalized when talk turns to kids, or travel, neither of which are her experience.  Probed whether she is interested, even if she doesn't have notes to compare from her own living.  Also probed possibility of asking or turning the subject to her own interests.  As self-esteem and her sense of value go, noted she may hear from others who have missed her, e.g., her intelligence or humor, but she tends to discount validating feedback.  Encouraged to let it be possible, and recognize depression actively trying to cancel favorable feedback.  Therapeutic modalities: Cognitive Behavioral Therapy, Solution-Oriented/Positive Psychology, Ego-Supportive, and Faith-sensitive  Mental Status/Observations:  Appearance:   Neat  , somewhat pale  Behavior:  Appropriate  Motor:  Normal  Speech/Language:   Clear and Coherent and sometimes slowed  Affect:  Appropriate and Constricted  Mood:  anxious and dysthymic  Thought process:  normal  Thought content:    WNL and worry  Sensory/Perceptual disturbances:    WNL  Orientation:  Fully oriented  Attention:  Good    Concentration:  Fair  Memory:  grossly intact  Insight:    Good  Judgment:   Poor  Impulse Control:  Good  Risk Assessment: Danger to Self: No Self-injurious Behavior: No Danger to Others: No Physical Aggression / Violence: No Duty to Warn: No Access to Firearms a concern: No  Assessment of progress:  progressing  Diagnosis:   ICD-10-CM   1. Severe episode of recurrent major depressive disorder, without psychotic features ([place])  F33.2     2. Insomnia, unspecified type  G47.00     3. r/o sleep apnea  R69       Plan:  For self-esteem -- recommended generate a list of ways people have told or shown her she is lovable or capable; no requirement to believe or agree, just take inventory.  Seek to readily notice and give self credit when she does anything constructive or principled for things that matter to her wellbeing or sense of purpose. ECT -- Continue as engaged.  Practice pleasant imagery and self-reminders it's temporary for pretreatment anxiety. Sleep -- Use soothing imagery for DFA.  Practice sleep hygiene, with comfortable arrangements and lights and any caffeine wound down ahead of bedtime.  For worries, write down worry thoughts and consciously decide whether to stay up and think or lay them down and sleep.  Pursue updated sleep apnea assessment, starting with clarifying request to PCP. Physiological mood factors -- Start the day with good lighting and a few minutes grounding outdoors.  May need to check vitamin levels and anemia. Financial concerns -- Follow through meeting with advisor available through [employer], and seek greater clarity on the outlook for monies in trust. Support system -- Maintain system of friends and home care, may wean cautiously from home care as able, within Jamie Gamble's judgment.  Re-engage friends as planned. Activities -- Develop activities of interest, for days with and without companions.  OK with working, provided it is sustainable. Intrusive thoughts -- Self-affirm God would not be punishing her, it's a trick of depression Placement concerns -- Independent living with part-time assistance and supervision should be fine at this time.  Available on request to interpret to family members and hear out concerns, if they remain. Other recommendations/advice as may be noted above Continue to utilize previously learned skills ad lib Maintain medication as prescribed and work faithfully with relevant prescriber(s) if any changes are desired or seem indicated Call the clinic on-call  service, 988/hotline, 911, or present to [facility] or ER if any life-threatening psychiatric crisis Return for session(s) already scheduled. Already scheduled visit in this office 12/24/2021.  Blanchie Serve, PhD Luan Moore, PhD LP Clinical Psychologist, [facility] [address] [place], [place] [address] [phone]

## 2021-12-24 ENCOUNTER — Ambulatory Visit (INDEPENDENT_AMBULATORY_CARE_PROVIDER_SITE_OTHER): Payer: Medicare PPO | Admitting: Adult Health

## 2021-12-24 ENCOUNTER — Encounter: Payer: Self-pay | Admitting: Adult Health

## 2021-12-24 DIAGNOSIS — G4700 Insomnia, unspecified: Secondary | ICD-10-CM

## 2021-12-24 DIAGNOSIS — F411 Generalized anxiety disorder: Secondary | ICD-10-CM | POA: Diagnosis not present

## 2021-12-24 DIAGNOSIS — F332 Major depressive disorder, recurrent severe without psychotic features: Secondary | ICD-10-CM | POA: Diagnosis not present

## 2021-12-24 MED ORDER — HYDROXYZINE HCL 25 MG PO TABS: ORAL_TABLET | ORAL | 2 refills | Status: DC

## 2021-12-24 NOTE — Progress Notes (Signed)
Jamie Gamble [id number] 16-Jan-1946 76 y.o.  Subjective:   Patient ID:  Jamie Gamble is a 76 y.o. (DOB 11/07/1945) female.  Chief Complaint: No chief complaint on file.   HPI Jamie Gamble presents to the office today for follow-up of GAD, MDD and insomnia.  Previously seen by Dr Evelene Croon.   Seeing therapist - Dr. Farrel Demark in August.  Accompanied by friend.  Receiving ECT  Describes mood today as "about the same". Pleasant. Denies tearfulness. Mood symptoms - reports depression - "better than I was before starting ECT treatments". Feels anxious at times - wanting to get out and do more". Denies irritability. Reports some worry and rumination - finances - health. Mood has improved. Stating "I'm doing a little better". Feels like the ECT treatments have been helpful for mood improvement. Varying interest and motivation. Taking medications as prescribed.  Energy levels lower. Active, does not have a regular exercise routine. Going to the gym some days - treadmill. Enjoys some usual interests and activities. Single. Lives alone. Not dating. No family local. Spending time with friends. Attends church.  Appetite adequate. Weight gain - 160 pounds. Sleeping difficulties. Averages 5 hours of broken. Denies daytime napping. Continues to struggle with sleep - currently taking 150mg  of Seroquel, 150mg  of Trazadone. along with 50mg  of Benadryl with an unsuccessful sleep pattern. Reports she did not sleep at all last night.  Reporting an in home sleep study that was inconclusive. Is awaiting an appointment for in office  testing. Focus and concentration "still about air"- memory loss from ECT. Completing some tasks. Managing aspects of household. Retired at age 37 from [employer]. Denies SI or HI. No feelings of peace or security. Denies AH or VH. Denies self harm. Denies substance use.   Previous medication trials: Trazodone, Hydroxyzine, Zoloft, Remeron, Seroquel    PHQ2-9    Flowsheet Row Counselor from  11/28/2021 in [facility] Office Visit from 08/13/2021 in [facility] [place] Office Visit from 04/04/2021 in [facility] [place] Office Visit from 02/27/2021 in [facility] [place] from 10/18/2020 in [facility] [place]  PHQ-2 Total Score 2 0 0 0 0  PHQ-9 Total Score 7 0 -- 0 0      Flowsheet Row ED from 06/04/2021 in [facility]  C-SSRS RISK CATEGORY No Risk        Review of Systems:  Review of Systems  Musculoskeletal:  Negative for gait problem.  Neurological:  Negative for tremors.  Psychiatric/Behavioral:         Please refer to HPI    Medications: I have reviewed the patient's current medications.  Current Outpatient Medications  Medication Sig Dispense Refill   atorvastatin (LIPITOR) 40 MG tablet Take 1 tablet (40 mg total) by mouth at bedtime. 90 tablet 1   escitalopram (LEXAPRO) 10 MG tablet Take 1/2 tablet daily x 7 days, then increase to one tablet daily. 30 tablet 2   Glycerin-Hypromellose-PEG 400 (VISINE DRY EYE OP) Place 1 drop into both eyes daily as needed (dry eyes).     hydrochlorothiazide (MICROZIDE) 12.5 MG capsule Take 1 capsule (12.5 mg total) by mouth daily. TAKE 1 CAPSULE(12.5 MG) BY MOUTH DAily 90 capsule 0   levocetirizine (XYZAL ALLERGY 24HR) 5 MG tablet Take 1 tablet (5 mg total) by mouth every evening. 90 tablet 1   losartan (COZAAR) 25 MG tablet Take 1 tablet (25 mg total) by mouth daily. TAKE 1 TABLET(25 MG) BY MOUTH daily  90 tablet 1   pantoprazole (PROTONIX) 40 MG tablet Take 1 tablet (40 mg total) by mouth daily. 90 tablet 0   Probiotic Product (PROBIOTIC DAILY PO) Take 1 capsule by mouth daily.     QUEtiapine (SEROQUEL) 100 MG tablet Take one to three tablets at bedtime. 90 tablet 0   traZODone (DESYREL) 50 MG tablet Take 2-3 tablets (100-150 mg total) by mouth at bedtime. 90 tablet 2   No current facility-administered medications for this  visit.    Medication Side Effects: None  Allergies:  Allergies  Allergen Reactions   Nexium [Esomeprazole] Diarrhea    Past Medical History:  Diagnosis Date   Depression    GERD (gastroesophageal reflux disease)    Hyperlipidemia    Hypertension     Past Medical History, Surgical history, Social history, and Family history were reviewed and updated as appropriate.   Please see review of systems for further details on the patient's review from today.   Objective:   Physical Exam:  There were no vitals taken for this visit.  Physical Exam Constitutional:      General: She is not in acute distress. Musculoskeletal:        General: No deformity.  Neurological:     Mental Status: She is alert and oriented to person, place, and time.     Coordination: Coordination normal.  Psychiatric:        Attention and Perception: Attention and perception normal. She does not perceive auditory or visual hallucinations.        Mood and Affect: Mood normal. Mood is not anxious or depressed. Affect is not labile, blunt, angry or inappropriate.        Speech: Speech normal.        Behavior: Behavior normal.        Thought Content: Thought content normal. Thought content is not paranoid or delusional. Thought content does not include homicidal or suicidal ideation. Thought content does not include homicidal or suicidal plan.        Cognition and Memory: Cognition and memory normal.        Judgment: Judgment normal.     Comments: Insight intact     Lab Review:     Component Value Date/Time   NA 140 08/13/2021 0905   K 4.6 08/13/2021 0905   CL 104 08/13/2021 0905   CO2 30 08/13/2021 0905   GLUCOSE 100 (H) 08/13/2021 0905   BUN 22 08/13/2021 0905   BUN 15 07/06/2019 0000   CREATININE 0.85 08/13/2021 0905   CALCIUM 9.3 08/13/2021 0905   PROT 6.8 08/13/2021 0905   ALBUMIN 4.0 08/13/2021 0905   AST 19 08/13/2021 0905   ALT 14 08/13/2021 0905   ALKPHOS 48 08/13/2021 0905   BILITOT  0.4 08/13/2021 0905   GFRNONAA >60 06/04/2021 1154       Component Value Date/Time   WBC 5.8 08/13/2021 0905   RBC 4.22 08/13/2021 0905   HGB 13.7 08/13/2021 0905   HCT 40.7 08/13/2021 0905   PLT 303.0 08/13/2021 0905   MCV 96.5 08/13/2021 0905   MCH 31.9 06/04/2021 1154   MCHC 33.7 08/13/2021 0905   RDW 13.8 08/13/2021 0905   LYMPHSABS 0.7 08/13/2021 0905   MONOABS 0.3 08/13/2021 0905   EOSABS 0.2 08/13/2021 0905   BASOSABS 0.1 08/13/2021 0905    No results found for: "POCLITH", "LITHIUM"   No results found for: "PHENYTOIN", "PHENOBARB", "VALPROATE", "CBMZ"   .res Assessment: Plan:    Plan:  Seeing  [name] for therapy - 2 visits.  PDMP reviewed  Continue:  Seroquel 100mg  - 1 to 3 tablets  bedtime for sleep - taking one tablet currently - plans to increase to 150mg  daily. Trazadone 50mg  - taking one to three tablets at bedtime for sleep  Add Hydroxyzine 25mg  - 1 to 2 at hs for sleep  Currently receiving E[facility] at [facility]  RTC 2 weeks  Patient advised to contact office with any questions, adverse effects, or acute worsening in signs and symptoms.  Time spent with patient was 25 minutes. Greater than 50% of face to face time with patient was spent on counseling and coordination of care.    Discussed potential metabolic side effects associated with atypical antipsychotics, as well as potential risk for movement side effects. Advised pt to contact office if movement side effects occur.   There are no diagnoses linked to this encounter.   Please see After Visit Summary for patient specific instructions.  Future Appointments  Date Time Provider Department Center  01/16/2022  1:00 PM , PhD CP-CP None  01/29/2022  2:00 PM [facility], PhD CP-CP None  02/12/2022  2:00 PM Robley Fries, PhD CP-CP None  02/26/2022  2:00 PM Robley Fries, PhD CP-CP None  03/12/2022  2:00 PM Robley Fries, PhD CP-CP None  03/31/2022  2:00 PM Robley Fries, PhD CP-CP None    No orders of the defined types were placed in this encounter.   -------------------------------

## 2021-12-30 ENCOUNTER — Ambulatory Visit: Payer: Medicare PPO | Admitting: Psychiatry

## 2022-01-07 ENCOUNTER — Telehealth: Payer: Self-pay | Admitting: Adult Health

## 2022-01-07 ENCOUNTER — Encounter: Payer: Self-pay | Admitting: Adult Health

## 2022-01-07 ENCOUNTER — Ambulatory Visit (INDEPENDENT_AMBULATORY_CARE_PROVIDER_SITE_OTHER): Payer: Medicare PPO | Admitting: Adult Health

## 2022-01-07 DIAGNOSIS — F411 Generalized anxiety disorder: Secondary | ICD-10-CM

## 2022-01-07 DIAGNOSIS — F331 Major depressive disorder, recurrent, moderate: Secondary | ICD-10-CM | POA: Diagnosis not present

## 2022-01-07 DIAGNOSIS — G4700 Insomnia, unspecified: Secondary | ICD-10-CM | POA: Diagnosis not present

## 2022-01-07 MED ORDER — FLUOXETINE HCL 10 MG PO CAPS: 10.0000 mg | ORAL_CAPSULE | Freq: Every day | ORAL | 2 refills | Status: DC

## 2022-01-07 MED ORDER — TRAZODONE HCL 50 MG PO TABS: 100.0000 mg | ORAL_TABLET | Freq: Every day | ORAL | 2 refills | Status: DC

## 2022-01-07 MED ORDER — QUETIAPINE FUMARATE 100 MG PO TABS: ORAL_TABLET | ORAL | 2 refills | Status: DC

## 2022-01-07 NOTE — Telephone Encounter (Signed)
Noted  

## 2022-01-07 NOTE — Telephone Encounter (Signed)
Addendum to attached message. Amayiah called back with the fax # to the sleep lab in [place]. It is [phone].

## 2022-01-07 NOTE — Telephone Encounter (Signed)
Pt called giving info to [facility]  [phone]

## 2022-01-07 NOTE — Progress Notes (Addendum)
Jamie Gamble [id number] 1946-03-23 76 y.o.  Subjective:   Patient ID:  Jamie Gamble is a 76 y.o. (DOB 17-Dec-1945) female.  Chief Complaint: No chief complaint on file.   HPI Jamie Gamble presents to the office today for follow-up of GAD, MDD and insomnia.  Previously seen by Dr Evelene Croon.   Seeing therapist - Dr. Farrel Demark in August.  Accompanied by friend.  Receiving ECT  Describes mood today as "about the same". Pleasant. Denies tearfulness. Mood symptoms - reports depression - "if I could get some sleep, my mood would be better". Reports some anxiety. Worries about health and finances. Denies irritability. Mood is consistent. Stating "I'm doing ok, just frustrated". Has completed ECT treatments. Varying interest and motivation. Taking medications as prescribed.  Energy levels lower. Active, does not have a regular exercise routine.  Enjoys some usual interests and activities. Single. Lives alone. Not dating. No family local. Spending time with friends. Attends church.  Appetite adequate. Weight gain - 160 pounds. Reports ongoing sleeping difficulties. Averages 5 hours of broken sleep or less. Denies daytime napping.  Focus and concentration difficulties memory loss from ECT. Completing some tasks. Managing aspects of household. Retired at age 59 from [employer]. Denies SI or HI.   Denies AH or VH. Denies self harm. Denies substance use.   Previous medication trials: Trazodone, Hydroxyzine, Zoloft, Remeron, Seroquel   PHQ2-9    Flowsheet Row Counselor from 11/28/2021 in [facility] Office Visit from 08/13/2021 in [facility] [place] Office Visit from 04/04/2021 in [facility] [place] Office Visit from 02/27/2021 in [facility] [employer] from 10/18/2020 in [facility] [place]  PHQ-2 Total Score 2 0 0 0 0  PHQ-9 Total Score 7 0 -- 0 0      Flowsheet Row ED from 06/04/2021 in [facility]PT  C-SSRS RISK CATEGORY No Risk        Review of Systems:  Review of Systems  Musculoskeletal:  Negative for gait problem.  Neurological:  Negative for tremors.  Psychiatric/Behavioral:         Please refer to HPI    Medications: I have reviewed the patient's current medications.  Current Outpatient Medications  Medication Sig Dispense Refill   FLUoxetine (PROZAC) 10 MG capsule Take 1 capsule (10 mg total) by mouth daily. 30 capsule 2   atorvastatin (LIPITOR) 40 MG tablet Take 1 tablet (40 mg total) by mouth at bedtime. 90 tablet 1   Glycerin-Hypromellose-PEG 400 (VISINE DRY EYE OP) Place 1 drop into both eyes daily as needed (dry eyes).     hydrochlorothiazide (MICROZIDE) 12.5 MG capsule Take 1 capsule (12.5 mg total) by mouth daily. TAKE 1 CAPSULE(12.5 MG) BY MOUTH DAily 90 capsule 0   hydrOXYzine (ATARAX) 25 MG tablet Take one to two tablets at bedtime. 60 tablet 2   levocetirizine (XYZAL ALLERGY 24HR) 5 MG tablet Take 1 tablet (5 mg total) by mouth every evening. 90 tablet 1   losartan (COZAAR) 25 MG tablet Take 1 tablet (25 mg total) by mouth daily. TAKE 1 TABLET(25 MG) BY MOUTH daily 90 tablet 1   pantoprazole (PROTONIX) 40 MG tablet Take 1 tablet (40 mg total) by mouth daily. 90 tablet 0   Probiotic Product (PROBIOTIC DAILY PO) Take 1 capsule by mouth daily.     QUEtiapine (SEROQUEL) 100 MG tablet Take one to three tablets at bedtime. 90 tablet 2   traZODone (DESYREL) 50 MG tablet Take 2-3 tablets (  100-150 mg total) by mouth at bedtime. 90 tablet 2   No current facility-administered medications for this visit.    Medication Side Effects: None  Allergies:  Allergies  Allergen Reactions   Nexium [Esomeprazole] Diarrhea    Past Medical History:  Diagnosis Date   Depression    GERD (gastroesophageal reflux disease)    Hyperlipidemia    Hypertension     Past Medical History, Surgical history, Social history, and Family history were reviewed and updated as  appropriate.   Please see review of systems for further details on the patient's review from today.   Objective:   Physical Exam:  There were no vitals taken for this visit.  Physical Exam Constitutional:      General: She is not in acute distress. Musculoskeletal:        General: No deformity.  Neurological:     Mental Status: She is alert and oriented to person, place, and time.     Coordination: Coordination normal.  Psychiatric:        Attention and Perception: Attention and perception normal. She does not perceive auditory or visual hallucinations.        Mood and Affect: Mood normal. Mood is not anxious or depressed. Affect is not labile, blunt, angry or inappropriate.        Speech: Speech normal.        Behavior: Behavior normal.        Thought Content: Thought content normal. Thought content is not paranoid or delusional. Thought content does not include homicidal or suicidal ideation. Thought content does not include homicidal or suicidal plan.        Cognition and Memory: Cognition and memory normal.        Judgment: Judgment normal.     Comments: Insight intact     Lab Review:     Component Value Date/Time   NA 140 08/13/2021 0905   K 4.6 08/13/2021 0905   CL 104 08/13/2021 0905   CO2 30 08/13/2021 0905   GLUCOSE 100 (H) 08/13/2021 0905   BUN 22 08/13/2021 0905   BUN 15 07/06/2019 0000   CREATININE 0.85 08/13/2021 0905   CALCIUM 9.3 08/13/2021 0905   PROT 6.8 08/13/2021 0905   ALBUMIN 4.0 08/13/2021 0905   AST 19 08/13/2021 0905   ALT 14 08/13/2021 0905   ALKPHOS 48 08/13/2021 0905   BILITOT 0.4 08/13/2021 0905   GFRNONAA >60 06/04/2021 1154       Component Value Date/Time   WBC 5.8 08/13/2021 0905   RBC 4.22 08/13/2021 0905   HGB 13.7 08/13/2021 0905   HCT 40.7 08/13/2021 0905   PLT 303.0 08/13/2021 0905   MCV 96.5 08/13/2021 0905   MCH 31.9 06/04/2021 1154   MCHC 33.7 08/13/2021 0905   RDW 13.8 08/13/2021 0905   LYMPHSABS 0.7 08/13/2021 0905    MONOABS 0.3 08/13/2021 0905   EOSABS 0.2 08/13/2021 0905   BASOSABS 0.1 08/13/2021 0905    No results found for: "POCLITH", "LITHIUM"   No results found for: "PHENYTOIN", "PHENOBARB", "VALPROATE", "CBMZ"   .res Assessment: Plan:    Plan:  Seeing [name] for therapy - 2 visits.  PDMP reviewed  Continue:  Seroquel 100mg  - 1 to 3 tablets  bedtime for sleep - taking 150mg  currently  Trazadone 50mg  - taking one to three tablets at bedtime for sleep Hydroxyzine 25mg  - 1 to 2 at hs for sleep  Currently receiving E[facility] at [facility].  Refer for sleep study -  patient reporting periods of apnea observed during ECT treatments at [facility]. Also reports inability to initiate and maintain a regular sleep pattern even with multiple medication trials. Currently taking Seroquel, Trazadone and Hydroxyzine. Has also recently tried Ambien.   RTC 2 weeks  Patient advised to contact office with any questions, adverse effects, or acute worsening in signs and symptoms.  Time spent with patient was 25 minutes. Greater than 50% of face to face time with patient was spent on counseling and coordination of care.    Discussed potential metabolic side effects associated with atypical antipsychotics, as well as potential risk for movement side effects. Advised pt to contact office if movement side effects occur.   Diagnoses and all orders for this visit:  Major depressive disorder, recurrent episode, moderate (HCC) -     FLUoxetine (PROZAC) 10 MG capsule; Take 1 capsule (10 mg total) by mouth daily.  Generalized anxiety disorder -     FLUoxetine (PROZAC) 10 MG capsule; Take 1 capsule (10 mg total) by mouth daily.  Insomnia, unspecified type -     traZODone (DESYREL) 50 MG tablet; Take 2-3 tablets (100-150 mg total) by mouth at bedtime. -     QUEtiapine (SEROQUEL) 100 MG tablet; Take one to three tablets at bedtime.     Please see After Visit Summary for patient specific  instructions.  Future Appointments  Date Time Provider [place]  01/16/2022  1:00 PM Blanchie Serve, PhD CP-CP None  01/29/2022  2:00 PM Blanchie Serve, PhD CP-CP None  02/04/2022  2:00 PM Yandel Zeiner, Berdie Ogren, NP CP-CP None  02/12/2022  2:00 PM Blanchie Serve, PhD CP-CP None  02/26/2022  2:00 PM Blanchie Serve, PhD CP-CP None  03/12/2022  2:00 PM Blanchie Serve, PhD CP-CP None  03/31/2022  2:00 PM Mitchum, Herbie Baltimore, PhD CP-CP None    No orders of the defined types were placed in this encounter.   -------------------------------

## 2022-01-10 ENCOUNTER — Telehealth: Payer: Self-pay | Admitting: Adult Health

## 2022-01-10 NOTE — Telephone Encounter (Signed)
Referral has been sent to [name]

## 2022-01-16 ENCOUNTER — Ambulatory Visit (INDEPENDENT_AMBULATORY_CARE_PROVIDER_SITE_OTHER): Payer: Medicare PPO | Admitting: Psychiatry

## 2022-01-16 DIAGNOSIS — G4700 Insomnia, unspecified: Secondary | ICD-10-CM

## 2022-01-16 DIAGNOSIS — R69 Illness, unspecified: Secondary | ICD-10-CM | POA: Diagnosis not present

## 2022-01-16 DIAGNOSIS — F411 Generalized anxiety disorder: Secondary | ICD-10-CM | POA: Diagnosis not present

## 2022-01-16 DIAGNOSIS — F331 Major depressive disorder, recurrent, moderate: Secondary | ICD-10-CM

## 2022-01-16 NOTE — Progress Notes (Signed)
Psychotherapy Progress Note [facility] Jamie Moore, PhD LP  Patient ID: LAJUNE [name])    MRN: [id number] Therapy format: Individual psychotherapy Date: 01/16/2022      Start: 1:12p     Stop: 1:58p     Time Spent: 46 min Location: In-person   Session narrative (presenting needs, interim history, self-report of stressors and symptoms, applications of prior therapy, status changes, and interventions made in session) Seen alone today.  Got accepted for a sleep study, with appreciated assistance from Jamie Gamble and staff here connecting with another sleep lab.  Turns out Jamie Gamble misinformed her about insurance denying here -- as explained by Jamie usual referral coordinator, returned from maternity leave, it is actually Jamie Gamble's blanket policy not to second-guess other sleep studies until 3 years have elapsed.  Affirmed and encouraged advocating for her needs and making Jamie necessary calls to find out.  Prompted a spontaneous, knowledgeable discussion of issues in Jamie Jamie Gamble.    Personally disaffected with weight gain of around 20 lbs or more, attributed to depression then Remeron causing incorrigible appetite at night.  Can't fit into her winter jackets yet, out of Jamie exercise habit so far, and Jamie Gamble is her go-to for meals when she is not up to preparing them herself.  Encouraged in walking as able, and discretion about portions.  No progress since last visit on fact-finding finances.  Was told a good retirement facility would be $10K/mo, though this sounds to be a misapprehension what level of care she'd be in.    ECT did not happen on schedule, not called for some reason till a week later, then she declined on grounds of anxious insomnia.  Has started Prozac 10mg  now, for further help taming rumination and worry.  Low dose for now, just to see if she tolerates first.  Discussed possibility of melatonin, cleared with psychiatry.   Informed about use and dosing and blue light control.  Left thumb helped by steroid injection, but still possible to need surgery later.    Has helped to put her attention onto positive things.  Affirmed and encouraged.  Therapeutic modalities: Cognitive Behavioral Therapy, Solution-Oriented/Positive Psychology, and Ego-Supportive  Mental Status/Observations:  Appearance:   Neat     Behavior:  Appropriate  Motor:  Normal  Speech/Language:   Clear and Coherent  Affect:  Appropriate  Mood:  Mildly anxious  Thought process:  normal  Thought content:    WNL  Sensory/Perceptual disturbances:    WNL  Orientation:  Fully oriented  Attention:  Good    Concentration:  Good  Memory:  WNL  Insight:    Good  Judgment:   Good  Impulse Control:  Good   Risk Assessment: Danger to Self: No Self-injurious Behavior: No Danger to Others: No Physical Aggression / Violence: No Duty to Warn: No Access to Firearms a concern: No  Assessment of progress:  progressing  Diagnosis:   ICD-10-CM   1. Major depressive disorder, recurrent episode, moderate (HCC)  F33.1    improving    2. Generalized anxiety disorder  F41.1     3. Insomnia, unspecified type  G47.00     4. r/o sleep apnea  R69      Plan:  ECT -- Endorse calling a halt at this time, as she is cognitively recovered since first presentation,pr and pretreatment apprehension is a strong cause of insomnia and need for sedation at treatment.  She understands and accepts Jamie burden to  notify treatment team promptly of relapse in dark/dreadful mood, severe demotivation, or intractable insomnia. Sleep -- Use soothing imagery for DFA.  Practice sleep hygiene, with comfortable arrangements and lights and any caffeine wound down well ahead of bedtime.  For worries, write down worry thoughts and consciously decide whether to stay up and think or lay them down and sleep.  Continue with second-opinion sleep assessment.  Option to start melatonin now,  dose 2-5mg  1-2 hrs ahead of bed.  Option to to use orange glasses in latter evening to help circadian signalling. Physiological mood setting -- Start Jamie day with good lighting and a few minutes grounding outdoors.  May need to check vitamin levels and anemia. Weight management -- Walk more as able, observe portion control and practice of leaving a bite, possible education on MIND Diet For self-esteem -- Recommended continue with blessing-counting and listing ways people have told or shown her she is lovable or capable, without any requirement to believe or agree, just take inventory.  Seek to readily notice and give self credit when she does anything constructive or principled for things that matter to her wellbeing or sense of purpose. Financial concerns -- Follow through fact-finding and meeting with advisor, in coordination with Jamie Gamble.  Still OK to seek part-time work she can reach from her home. Support system -- Maintain system of friends and home care, may wean cautiously from home care as able, within Jamie Gamble's judgment.  Re-engage friends as planned.  [employer] available for supportive calls and possible facility-based recreation Activities -- Develop activities of interest, for days with and without companions.  OK with working, provided it is sustainable. Intrusive thoughts -- As needed, self-affirm God would not be punishing her, it's a trick of depression. Placement concerns -- Independent living with minimal assistance should be fine at this time.  Available on request to interpret her condition to family members and hear out concerns. Other recommendations/advice as may be noted above Continue to utilize previously learned skills ad lib Maintain medication as prescribed and work faithfully with relevant prescriber(s) if any changes are desired or seem indicated Call Jamie clinic on-call service, 988/hotline, 911, or present to [facility] or ER if any life-threatening psychiatric crisis Return  for as already scheduled. Already scheduled visit in this office 01/29/2022.  Robley Fries, PhD [name], PhD LP Clinical Psychologist, [facility] [address] [place], [place] [address] [phone]

## 2022-01-17 DIAGNOSIS — H2513 Age-related nuclear cataract, bilateral: Secondary | ICD-10-CM | POA: Diagnosis not present

## 2022-01-17 DIAGNOSIS — H04123 Dry eye syndrome of bilateral lacrimal glands: Secondary | ICD-10-CM | POA: Diagnosis not present

## 2022-01-21 DIAGNOSIS — G4719 Other hypersomnia: Secondary | ICD-10-CM | POA: Diagnosis not present

## 2022-01-21 DIAGNOSIS — F5104 Psychophysiologic insomnia: Secondary | ICD-10-CM | POA: Diagnosis not present

## 2022-01-21 DIAGNOSIS — I10 Essential (primary) hypertension: Secondary | ICD-10-CM | POA: Diagnosis not present

## 2022-01-21 DIAGNOSIS — F419 Anxiety disorder, unspecified: Secondary | ICD-10-CM | POA: Diagnosis not present

## 2022-01-29 ENCOUNTER — Ambulatory Visit (INDEPENDENT_AMBULATORY_CARE_PROVIDER_SITE_OTHER): Payer: Medicare PPO | Admitting: Psychiatry

## 2022-01-29 DIAGNOSIS — F411 Generalized anxiety disorder: Secondary | ICD-10-CM | POA: Diagnosis not present

## 2022-01-29 DIAGNOSIS — G4700 Insomnia, unspecified: Secondary | ICD-10-CM | POA: Diagnosis not present

## 2022-01-29 DIAGNOSIS — F3341 Major depressive disorder, recurrent, in partial remission: Secondary | ICD-10-CM | POA: Diagnosis not present

## 2022-01-29 DIAGNOSIS — R69 Illness, unspecified: Secondary | ICD-10-CM

## 2022-01-29 NOTE — Progress Notes (Signed)
Psychotherapy Progress Note [facility] [name], PhD LP  Patient ID: ILYANA MANUELE [facility])    MRN: [id number] Therapy format: Individual psychotherapy Date: 01/29/2022      Start: 2:14p     Stop: 3:00p     Time Spent: 46 min Location: In-person   Session narrative (presenting needs, interim history, self-report of stressors and symptoms, applications of prior therapy, status changes, and interventions made in session) Seen alone again, [name] occupied.  Is trying the orange lenses and getting substantial benefit, now recommending them to others.  Affirmed and encouraged.  Shares text to her nephew Fraser Din thanking him for his help and management and letting him know she is working on her sleep and finances and offering that she means to go for a part-time job with further recovery.  Warm, affirming answer from him that also offer reassurance that her finances will be backed up at need.  Just made through enrollment season, decided to hang onto current plan then saw an article decrying MA plans' record of denying services.  Encouraged to keep faith and investigate as needed.  Musing lately about being constitutionally super-independent.  Curious how she got a Dependent PD diagnosis in the [facility] system.  Now in a quandary with her church that she will come to be seen as too big a burden.  Recent share with friends there about the tragedies she's had to see (brother's suicide, the latest) has her feeling maybe ignorable.  Preoccupied with a [employer] comrade and with a set of experiences at her large, affluent church that seem to portray a glass wall for mutual caregiving and support.  Validated and encouraged in controlling worry, exploring what is available for her with less apprehension and more curiosity.  Continues to attend [employer] regularly, enjoys the camaraderie and authentic company there.   Therapeutic modalities: Cognitive Behavioral Therapy,  Solution-Oriented/Positive Psychology, and Ego-Supportive  Mental Status/Observations:  Appearance:   Casual and Neat     Behavior:  Appropriate  Motor:  Normal  Speech/Language:   Clear and Coherent  Affect:  Appropriate  Mood:  worried  Thought process:  Clearly quicker more alert  Thought content:    WNL and worry  Sensory/Perceptual disturbances:    WNL  Orientation:  Fully oriented  Attention:  Good    Concentration:  Fair  Memory:  WNL  Insight:    Good  Judgment:   Good  Impulse Control:  Good   Risk Assessment: Danger to Self: No Self-injurious Behavior: No Danger to Others: No Physical Aggression / Violence: No Duty to Warn: No Access to Firearms a concern: No  Assessment of progress:  progressing  Diagnosis:   ICD-10-CM   1. Major depressive disorder, recurrent, in partial remission (HCC)  F33.41     2. Generalized anxiety disorder  F41.1     3. Insomnia, unspecified type  G47.00     4. r/o sleep apnea  R69      Plan:  EHR concern -- Offer to notate diagnosis if it may help prevent bias ECT -- On pause, cognitively recovered since first presentation, and pretreatment apprehension has been a strong cause of insomnia and need for sedation at treatment.  She understands and accepts the burden to notify treatment team promptly of relapse in dark/dreadful mood, severe demotivation, or intractable insomnia. Sleep -- Continue use of orange glasses to facilitate circadian signalling and sleep readiness.  Option to start melatonin now, dose 2-5mg  1-2 hrs ahead of bed.  Use soothing imagery for DFA.  Practice sleep hygiene, with comfortable arrangements and lights and any caffeine wound down well ahead of bedtime.  For worries, write down worry thoughts and consciously decide whether to stay up and think or lay them down and sleep.  Continue with second-opinion sleep assessment. Physiological mood setting -- Start the day with good lighting and a few minutes grounding  outdoors.  May need to check vitamin levels and anemia. Weight management -- Walk more as able, observe portion control and practice of leaving a bite, possible education on MIND Diet For self-esteem -- Recommended continue with blessing-counting and listing ways people have told or shown her she is lovable or capable, without any requirement to believe or agree, just take inventory.  Seek to readily notice and give self credit when she does anything constructive or principled for things that matter to her wellbeing or sense of purpose. Financial concerns -- Follow through fact-finding and meeting with advisor, in coordination with [name].  Still OK to seek part-time work she can reach from her home. Support system -- Maintain system of friends, [employer], and any home care as needed.  Re-engage friends actively, and dispute worry thoughts of being judged.  Maintain cordial working relationship with nephew/trustee.  [employer] available for supportive calls and possible facility-based recreation. Activities -- Develop activities of interest, for days with and without companions.  OK with working, provided it is sustainable. Intrusive thoughts -- As needed, self-affirm God would not be punishing her, it's a trick of depression. Placement concerns -- Independent living with minimal assistance should be fine at this time.  Available on request to interpret her condition to family members and hear out concerns. Other recommendations/advice as may be noted above Continue to utilize previously learned skills ad lib Maintain medication as prescribed and work faithfully with relevant prescriber(s) if any changes are desired or seem indicated Call the clinic on-call service, 988/hotline, 911, or present to [facility] or ER if any life-threatening psychiatric crisis Return for as already scheduled. Already scheduled visit in this office 02/04/2022.  Robley Fries, PhD [name], PhD LP Clinical  Psychologist, [facility] [address] [place], [place] [address] [phone]

## 2022-02-04 ENCOUNTER — Telehealth (INDEPENDENT_AMBULATORY_CARE_PROVIDER_SITE_OTHER): Payer: Medicare PPO | Admitting: Adult Health

## 2022-02-04 ENCOUNTER — Encounter: Payer: Self-pay | Admitting: Adult Health

## 2022-02-04 DIAGNOSIS — F411 Generalized anxiety disorder: Secondary | ICD-10-CM

## 2022-02-04 DIAGNOSIS — F331 Major depressive disorder, recurrent, moderate: Secondary | ICD-10-CM | POA: Diagnosis not present

## 2022-02-04 DIAGNOSIS — G4700 Insomnia, unspecified: Secondary | ICD-10-CM | POA: Diagnosis not present

## 2022-02-04 NOTE — Progress Notes (Signed)
Jamie Gamble [id number] 1945-11-11 76 y.o.  Virtual Visit via Video Note  I connected with pt @ on 02/04/22 at  2:00 PM EST by a video enabled telemedicine application and verified that I am speaking with the correct person using two identifiers.   I discussed the limitations of evaluation and management by telemedicine and the availability of in person appointments. The patient expressed understanding and agreed to proceed.  I discussed the assessment and treatment plan with the patient. The patient was provided an opportunity to ask questions and all were answered. The patient agreed with the plan and demonstrated an understanding of the instructions.   The patient was advised to call back or seek an in-person evaluation if the symptoms worsen or if the condition fails to improve as anticipated.  I provided 20 minutes of non-face-to-face time during this encounter.  The patient was located at home.  The provider was located at [facility].   Dorothyann Gibbs, NP   Subjective:   Patient ID:  Jamie Gamble is a 76 y.o. (DOB 1946-01-22) female.  Chief Complaint: No chief complaint on file.   HPI Jamie Gamble presents for follow-up GAD, MDD and insomnia.  Previously seen by Dr Evelene Croon.   Seeing therapist - Dr. Farrel Demark   Describes mood today as "better". Pleasant. Denies tearfulness. Mood symptoms - denies depression - "gets down sometimes". Reports some anxiety. Denies irritability. Mood is consistent. Stating "I'm doing ok". Feels encouraged with upcoming sleep study. Has completed ECT treatments. Varying interest and motivation. Taking medications as prescribed.  Energy levels improved. Active, does not have a regular exercise routine. Walking over to the grocery store. Enjoys some usual interests and activities. Single. Lives alone. Not dating. No family local. Spending time with friends. Attends church.  Appetite adequate. Weight stable - 160 pounds. Sleep has improved.  Averages 7 hours of broken sleep or less. Denies daytime napping. Upcoming sleep study. Focus and concentration difficulties - memory loss from ECT. Completing some tasks. Managing aspects of household. Retired at age 89 from [employer]. Denies SI or HI.   Denies AH or VH. Denies self harm. Denies substance use.    Review of Systems:  Review of Systems  Musculoskeletal:  Negative for gait problem.  Neurological:  Negative for tremors.  Psychiatric/Behavioral:         Please refer to HPI    Medications: I have reviewed the patient's current medications.  Current Outpatient Medications  Medication Sig Dispense Refill   atorvastatin (LIPITOR) 40 MG tablet Take 1 tablet (40 mg total) by mouth at bedtime. 90 tablet 1   FLUoxetine (PROZAC) 10 MG capsule Take 1 capsule (10 mg total) by mouth daily. 30 capsule 2   Glycerin-Hypromellose-PEG 400 (VISINE DRY EYE OP) Place 1 drop into both eyes daily as needed (dry eyes).     hydrochlorothiazide (MICROZIDE) 12.5 MG capsule Take 1 capsule (12.5 mg total) by mouth daily. TAKE 1 CAPSULE(12.5 MG) BY MOUTH DAily 90 capsule 0   hydrOXYzine (ATARAX) 25 MG tablet Take one to two tablets at bedtime. 60 tablet 2   levocetirizine (XYZAL ALLERGY 24HR) 5 MG tablet Take 1 tablet (5 mg total) by mouth every evening. 90 tablet 1   losartan (COZAAR) 25 MG tablet Take 1 tablet (25 mg total) by mouth daily. TAKE 1 TABLET(25 MG) BY MOUTH daily 90 tablet 1   pantoprazole (PROTONIX) 40 MG tablet Take 1 tablet (40 mg total) by mouth daily. 90 tablet 0  Probiotic Product (PROBIOTIC DAILY PO) Take 1 capsule by mouth daily.     QUEtiapine (SEROQUEL) 100 MG tablet Take one to three tablets at bedtime. 90 tablet 2   traZODone (DESYREL) 50 MG tablet Take 2-3 tablets (100-150 mg total) by mouth at bedtime. 90 tablet 2   No current facility-administered medications for this visit.    Medication Side Effects: None  Allergies:  Allergies  Allergen Reactions   Nexium  [Esomeprazole] Diarrhea    Past Medical History:  Diagnosis Date   Depression    GERD (gastroesophageal reflux disease)    Hyperlipidemia    Hypertension     Family History  Problem Relation Age of Onset   Depression Mother    Cancer Father    Depression Brother     Social History   Socioeconomic History   Marital status: Single    Spouse name: Not on file   Number of children: 0   Years of education: Not on file   Highest education level: Doctorate  Occupational History   Occupation: Retired [employer] Employee  Tobacco Use   Smoking status: Former   Smokeless tobacco: Never  Building services engineer Use: Never used  Substance and Sexual Activity   Alcohol use: Not Currently   Drug use: Never   Sexual activity: Not on file  Other Topics Concern   Not on file  Social History Narrative   Not on file   Social Determinants of Health   Financial Resource Strain: Low Risk  (02/26/2021)   Overall Financial Resource Strain (CARDIA)    Difficulty of Paying Living Expenses: Not hard at all  Food Insecurity: No Food Insecurity (02/26/2021)   Hunger Vital Sign    Worried About Running Out of Food in the Last Year: Never true    Ran Out of Food in the Last Year: Never true  Transportation Needs: No Transportation Needs (02/26/2021)   PRAPARE - Administrator, Civil Service (Medical): No    Lack of Transportation (Non-Medical): No  Physical Activity: Sufficiently Active (02/26/2021)   Exercise Vital Sign    Days of Exercise per Week: 7 days    Minutes of Exercise per Session: 30 min  Stress: No Stress Concern Present (02/26/2021)   [employer] of Occupational Health - Occupational Stress Questionnaire    Feeling of Stress : Not at all  Social Connections: Unknown (02/26/2021)   Social Connection and Isolation Panel [NHANES]    Frequency of Communication with Friends and Family: More than three times a week    Frequency of Social Gatherings with Friends and  Family: More than three times a week    Attends Religious Services: More than 4 times per year    Active Member of [employer] or Organizations: Yes    Attends Engineer, structural: More than 4 times per year    Marital Status: Patient refused  Intimate Partner Violence: Not on file    Past Medical History, Surgical history, Social history, and Family history were reviewed and updated as appropriate.   Please see review of systems for further details on the patient's review from today.   Objective:   Physical Exam:  There were no vitals taken for this visit.  Physical Exam Constitutional:      General: She is not in acute distress. Musculoskeletal:        General: No deformity.  Neurological:     Mental Status: She is alert and oriented to person, place,  and time.     Coordination: Coordination normal.  Psychiatric:        Attention and Perception: Attention and perception normal. She does not perceive auditory or visual hallucinations.        Mood and Affect: Mood normal. Mood is not anxious or depressed. Affect is not labile, blunt, angry or inappropriate.        Speech: Speech normal.        Behavior: Behavior normal.        Thought Content: Thought content normal. Thought content is not paranoid or delusional. Thought content does not include homicidal or suicidal ideation. Thought content does not include homicidal or suicidal plan.        Cognition and Memory: Cognition and memory normal.        Judgment: Judgment normal.     Comments: Insight intact     Lab Review:     Component Value Date/Time   NA 140 08/13/2021 0905   K 4.6 08/13/2021 0905   CL 104 08/13/2021 0905   CO2 30 08/13/2021 0905   GLUCOSE 100 (H) 08/13/2021 0905   BUN 22 08/13/2021 0905   BUN 15 07/06/2019 0000   CREATININE 0.85 08/13/2021 0905   CALCIUM 9.3 08/13/2021 0905   PROT 6.8 08/13/2021 0905   ALBUMIN 4.0 08/13/2021 0905   AST 19 08/13/2021 0905   ALT 14 08/13/2021 0905   ALKPHOS 48  08/13/2021 0905   BILITOT 0.4 08/13/2021 0905   GFRNONAA >60 06/04/2021 1154       Component Value Date/Time   WBC 5.8 08/13/2021 0905   RBC 4.22 08/13/2021 0905   HGB 13.7 08/13/2021 0905   HCT 40.7 08/13/2021 0905   PLT 303.0 08/13/2021 0905   MCV 96.5 08/13/2021 0905   MCH 31.9 06/04/2021 1154   MCHC 33.7 08/13/2021 0905   RDW 13.8 08/13/2021 0905   LYMPHSABS 0.7 08/13/2021 0905   MONOABS 0.3 08/13/2021 0905   EOSABS 0.2 08/13/2021 0905   BASOSABS 0.1 08/13/2021 0905    No results found for: "POCLITH", "LITHIUM"   No results found for: "PHENYTOIN", "PHENOBARB", "VALPROATE", "CBMZ"   .res Assessment: Plan:    Plan:  Seeing Marliss CzarAndy Mitchum for therapy - 2 visits.  PDMP reviewed  Continue:  Seroquel 100mg  - 1 tablet  bedtime for sleep Trazadone 50mg  - taking one tablet at bedtime for sleep Hydroxyzine 25mg  - 1 at hs for sleep Prozac 10mg  daily  Using Melatonin 5mg  at hs  Followed by Marliss CzarAndy Mitchum - therapy  [facility]  Sleep study scheduled for January 9th.  RTC 2 weeks  Patient advised to contact office with any questions, adverse effects, or acute worsening in signs and symptoms.  Time spent with patient was 25 minutes. Greater than 50% of face to face time with patient was spent on counseling and coordination of care.    Discussed potential metabolic side effects associated with atypical antipsychotics, as well as potential risk for movement side effects. Advised pt to contact office if movement side effects occur.   There are no diagnoses linked to this encounter.   Please see After Visit Summary for patient specific instructions.  Future Appointments  Date Time Provider Department Center  02/12/2022  2:00 PM Robley FriesMitchum, Robert, PhD CP-CP None  02/26/2022  2:00 PM Robley FriesMitchum, Robert, PhD CP-CP None  03/12/2022  2:00 PM Robley FriesMitchum, Robert, PhD CP-CP None  03/31/2022  2:00 PM Robley FriesMitchum, Robert, PhD CP-CP None  04/21/2022  2:00 PM Mitchum, Molly Maduroobert,  PhD CP-CP None    No orders of the defined types were placed in this encounter.     -------------------------------

## 2022-02-05 DIAGNOSIS — F607 Dependent personality disorder: Secondary | ICD-10-CM | POA: Diagnosis not present

## 2022-02-05 DIAGNOSIS — G4700 Insomnia, unspecified: Secondary | ICD-10-CM | POA: Diagnosis not present

## 2022-02-05 DIAGNOSIS — F332 Major depressive disorder, recurrent severe without psychotic features: Secondary | ICD-10-CM | POA: Diagnosis not present

## 2022-02-11 ENCOUNTER — Telehealth (INDEPENDENT_AMBULATORY_CARE_PROVIDER_SITE_OTHER): Payer: Medicare PPO | Admitting: Internal Medicine

## 2022-02-11 ENCOUNTER — Encounter: Payer: Self-pay | Admitting: Internal Medicine

## 2022-02-11 ENCOUNTER — Telehealth: Payer: Self-pay | Admitting: Internal Medicine

## 2022-02-11 VITALS — Wt 167.0 lb

## 2022-02-11 DIAGNOSIS — E669 Obesity, unspecified: Secondary | ICD-10-CM

## 2022-02-11 DIAGNOSIS — K219 Gastro-esophageal reflux disease without esophagitis: Secondary | ICD-10-CM | POA: Diagnosis not present

## 2022-02-11 MED ORDER — PANTOPRAZOLE SODIUM 40 MG PO TBEC: 40.0000 mg | DELAYED_RELEASE_TABLET | Freq: Every day | ORAL | 1 refills | Status: AC

## 2022-02-11 NOTE — Progress Notes (Signed)
Virtual Visit via Video Note  I connected with [name] on 02/11/22 at 11:30 AM EST by a video enabled telemedicine application and verified that I am speaking with the correct person using two identifiers.  Location patient: home Location provider: work office Persons participating in the virtual visit: patient, provider  I discussed the limitations of evaluation and management by telemedicine and the availability of in person appointments. The patient expressed understanding and agreed to proceed.   HPI: : Jamie Gamble is a 76 y.o. female who is coming in today for the above mentioned reasons.  She has recently been having a hard time with her depression.  She was started on Remeron, which unbeknownst to her as an appetite stimulant.  She has gained some weight.  She is interested in pursuing weight loss medication.    ROS: Constitutional: Denies fever, chills, diaphoresis, appetite change and fatigue.  HEENT: Denies photophobia, eye pain, redness, hearing loss, ear pain, congestion, sore throat, rhinorrhea, sneezing, mouth sores, trouble swallowing, neck pain, neck stiffness and tinnitus.   Respiratory: Denies SOB, DOE, cough, chest tightness,  and wheezing.   Cardiovascular: Denies chest pain, palpitations and leg swelling.  Gastrointestinal: Denies nausea, vomiting, abdominal pain, diarrhea, constipation, blood in stool and abdominal distention.  Genitourinary: Denies dysuria, urgency, frequency, hematuria, flank pain and difficulty urinating.  Endocrine: Denies: hot or cold intolerance, sweats, changes in hair or nails, polyuria, polydipsia. Musculoskeletal: Denies myalgias, back pain, joint swelling, arthralgias and gait problem.  Skin: Denies pallor, rash and wound.  Neurological: Denies dizziness, seizures, syncope, weakness, light-headedness, numbness and headaches.  Hematological: Denies adenopathy. Easy bruising, personal or family bleeding history   Psychiatric/Behavioral: Denies suicidal ideation, mood changes, confusion, nervousness, sleep disturbance and agitation   Past Medical History:  Diagnosis Date   Depression    GERD (gastroesophageal reflux disease)    Hyperlipidemia    Hypertension     Past Surgical History:  Procedure Laterality Date   TONSILLECTOMY      Family History  Problem Relation Age of Onset   Depression Mother    Cancer Father    Depression Brother     SOCIAL HX:   reports that she has quit smoking. She has never used smokeless tobacco. She reports that she does not currently use alcohol. She reports that she does not use drugs.   Current Outpatient Medications:    atorvastatin (LIPITOR) 40 MG tablet, Take 1 tablet (40 mg total) by mouth at bedtime., Disp: 90 tablet, Rfl: 1   FLUoxetine (PROZAC) 10 MG capsule, Take 1 capsule (10 mg total) by mouth daily., Disp: 30 capsule, Rfl: 2   Glycerin-Hypromellose-PEG 400 (VISINE DRY EYE OP), Place 1 drop into both eyes daily as needed (dry eyes)., Disp: , Rfl:    hydrochlorothiazide (MICROZIDE) 12.5 MG capsule, Take 1 capsule (12.5 mg total) by mouth daily. TAKE 1 CAPSULE(12.5 MG) BY MOUTH DAily, Disp: 90 capsule, Rfl: 0   hydrOXYzine (ATARAX) 25 MG tablet, Take one to two tablets at bedtime., Disp: 60 tablet, Rfl: 2   losartan (COZAAR) 25 MG tablet, Take 1 tablet (25 mg total) by mouth daily. TAKE 1 TABLET(25 MG) BY MOUTH daily, Disp: 90 tablet, Rfl: 1   Probiotic Product (PROBIOTIC DAILY PO), Take 1 capsule by mouth daily., Disp: , Rfl:    QUEtiapine (SEROQUEL) 100 MG tablet, Take one to three tablets at bedtime., Disp: 90 tablet, Rfl: 2   traZODone (DESYREL) 50 MG tablet, Take 2-3 tablets (100-150 mg  total) by mouth at bedtime., Disp: 90 tablet, Rfl: 2   pantoprazole (PROTONIX) 40 MG tablet, Take 1 tablet (40 mg total) by mouth daily., Disp: 90 tablet, Rfl: 1  EXAM:   VITALS per patient if applicable: none reported  GENERAL: alert, oriented, appears  well and in no acute distress  HEENT: atraumatic, conjunttiva clear, no obvious abnormalities on inspection of external nose and ears  NECK: normal movements of the head and neck  LUNGS: on inspection no signs of respiratory distress, breathing rate appears normal, no obvious gross increased work of breathing, gasping or wheezing  CV: no obvious cyanosis  MS: moves all visible extremities without noticeable abnormality  PSYCH/NEURO: pleasant and cooperative, no obvious depression or anxiety, speech and thought processing grossly intact  ASSESSMENT AND PLAN:   Obesity (BMI 30-39.9) - Plan: Amb ref to Medical Nutrition Therapy-MNT  Gastroesophageal reflux disease, unspecified whether esophagitis present - Plan: pantoprazole (PROTONIX) 40 MG tablet  -She will start a food diary and work hard on nutrition and other lifestyle changes. -Referral to dietitian will be placed.  Hold off on weight loss medication prescriptions for now.    I discussed the assessment and treatment plan with the patient. The patient was provided an opportunity to ask questions and all were answered. The patient agreed with the plan and demonstrated an understanding of the instructions.   The patient was advised to call back or seek an in-person evaluation if the symptoms worsen or if the condition fails to improve as anticipated.    [name], MD  [facility]

## 2022-02-11 NOTE — Telephone Encounter (Signed)
Patient states that the dialectician does not accept medicare and the cost is $250.  She will go on [place] and find some good books.

## 2022-02-11 NOTE — Telephone Encounter (Signed)
Patient states that they will only pay if the patient has diabetes or kidney disease.

## 2022-02-11 NOTE — Telephone Encounter (Signed)
Pt is calling and would like rachel to call her back she has update to the appt she had today

## 2022-02-12 ENCOUNTER — Ambulatory Visit (INDEPENDENT_AMBULATORY_CARE_PROVIDER_SITE_OTHER): Payer: Medicare PPO | Admitting: Psychiatry

## 2022-02-12 DIAGNOSIS — Z9189 Other specified personal risk factors, not elsewhere classified: Secondary | ICD-10-CM | POA: Diagnosis not present

## 2022-02-12 DIAGNOSIS — G4700 Insomnia, unspecified: Secondary | ICD-10-CM

## 2022-02-12 DIAGNOSIS — F3341 Major depressive disorder, recurrent, in partial remission: Secondary | ICD-10-CM | POA: Diagnosis not present

## 2022-02-12 DIAGNOSIS — R69 Illness, unspecified: Secondary | ICD-10-CM | POA: Diagnosis not present

## 2022-02-12 DIAGNOSIS — F411 Generalized anxiety disorder: Secondary | ICD-10-CM

## 2022-02-12 NOTE — Progress Notes (Unsigned)
Psychotherapy Progress Note [facility] [name], PhD LP  Patient ID: Jamie Gamble [facility])    MRN: [id number] Therapy format: Individual psychotherapy Date: 02/12/2022      Start: 2:13p     Stop: 2:59p     Time Spent: 46 min Location: In-person   Session narrative (presenting needs, interim history, self-report of stressors and symptoms, applications of prior therapy, status changes, and interventions made in session) Greatly appreciative of the effort to clarify her diagnosis on EHR.  Informed of the likely limits of action taken, and feel free to represent herself as needed, should providers in the [facility] system or elsewhere pick up the PD dx and it seem to bias treatment.  Orange lenses still very helpful for sleep readiness and quality, sleep becoming a good bit more regular, much less difficulty.  Got turned down for weight loss medication, then found out Medicare won't cover nutritional intervention.  Discussed recourse to appeal and work out suitable treatment with relevant physician.  Meanwhile, usual recommendations to watch portions, stay mobile.  No openings found as yet for part-time work in walking distance, and still awaiting clearer word on her trust and financial planning.  Jamie Gamble has been particularly busy of late, not able to come back to the questions of her nephew/trustee.    Church has become less comfortable of late, feels she's among some elitists and people who don't take certain matters seriously that are important to her.  Support/empathy provided.  Option to realign church or work with who she has.  More hx taken of her work life at [employer] including times she felt particularly respected and useful and how/why she left.  Discussed Thanksgiving plans -- not sure what is happening.  Certainly no family invitations have come.  Suggested possibilities with her housing community and any agencies going out to feed homeless/poor, if she might like to be  involved in giving with others.  Jamie Gamble did ask about what food she likes, suggesting she may be readying to invite.  Option to ask directly.    Therapeutic modalities: Cognitive Behavioral Therapy, Solution-Oriented/Positive Psychology, Ego-Supportive, and Faith-sensitive  Mental Status/Observations:  Appearance:   Neat     Behavior:  Appropriate  Motor:  Normal  Speech/Language:   Clear and Coherent  Affect:  Appropriate  Mood:  normal and a bit anxious. sad  Thought process:  normal  Thought content:    WNL  Sensory/Perceptual disturbances:    WNL  Orientation:  Fully oriented  Attention:  Good    Concentration:  Fair  Memory:  WNL  Insight:    Good  Judgment:   Good  Impulse Control:  Good   Risk Assessment: Danger to Self: No Self-injurious Behavior: No Danger to Others: No Physical Aggression / Violence: No Duty to Warn: No Access to Firearms a concern: No  Assessment of progress:  progressing  Diagnosis:   ICD-10-CM   1. Major depressive disorder, recurrent, in partial remission (HCC)  F33.41     2. Generalized anxiety disorder  F41.1     3. Insomnia, unspecified type  G47.00     4. r/o sleep apnea  R69     5. At increased risk for social isolation  Z91.89      Plan:  Look into available Thanksgiving options including community service, inclusion in [name]'s family gathering Sleep -- Continue orange glasses QPM to facilitate circadian signalling, sleep readiness, sleep hardiness.  Option to add melatonin 2-5mg  1-2 hr ahead of  bed.  Use soothing imagery PRN for DFA.  Practice sleep hygiene, with comfortable arrangements and lights and any caffeine stopped well ahead of bedtime (at least 6 hrs).  For worries, write down worry thoughts and consciously decide whether to stay up and think or lay them down and sleep.  Continue with second-opinion sleep assessment. Physiological mood factors -- Start the day with good lighting and a few minutes grounding outdoors. Seek  movement as early as feasible.  May need to check vitamin levels incl B12 and D, anemia. Weight management -- OK with seeking medication, but make sure to stay mobile, watch portions, control carbs.  May observe practice of leaving a bite, work toward foods in the MIND Diet. For self-esteem -- PRN practices of blessing-counting and listing ways people have told/shown her she is lovable/capable, without any requirement to believe or agree, just consider.  Seek to notice and give self credit when she does anything constructive or principled for things that matter to her wellbeing or sense of purpose. Intrusive guilt/shame thoughts -- As needed, self-affirm God would not be punishing her, it's a trick of depression. Activities -- Develop activities of interest, for days with and without companions.  OK with working, provided it is sustainable.Financial concerns -- Follow through fact-finding and meeting with advisor, in coordination with Jamie Gamble.  Still OK to seek part-time work she can reach from her home.  Maintain cordial working relationship with nephew/trustee. Support system/social involvement -- Maintain system of friends, [employer], any available at housing community, and any home care as needed.  Re-engage available friends actively, and dispute worry thoughts of being judged.  Senior Resources available for supportive calls and possible facility-based recreation. Placement concerns -- Independent living with minimal assistance should be fine at this time.  Available on request to interpret her condition to family members and hear out concerns. ECT -- On pause, cognitively recovered since first presentation, and pretreatment apprehension has been a strong cause of insomnia and need for sedation at treatment.  She understands and accepts the burden to notify treatment team promptly of relapse in dark/dreadful mood, severe demotivation, or intractable insomnia. Other recommendations/advice as may be  noted above Continue to utilize previously learned skills ad lib Maintain medication as prescribed and work faithfully with relevant prescriber(s) if any changes are desired or seem indicated Call the clinic on-call service, 988/hotline, 911, or present to [facility] or ER if any life-threatening psychiatric crisis Return for as already scheduled. Already scheduled visit in this office 02/26/2022.  Robley Fries, PhD [name], PhD LP Clinical Psychologist, [facility] [address] [place], [place] [address] [phone]

## 2022-02-17 ENCOUNTER — Other Ambulatory Visit: Payer: Self-pay | Admitting: Internal Medicine

## 2022-02-17 NOTE — Telephone Encounter (Signed)
Pt called to FU on this refill request  

## 2022-02-26 ENCOUNTER — Ambulatory Visit (INDEPENDENT_AMBULATORY_CARE_PROVIDER_SITE_OTHER): Payer: Medicare PPO | Admitting: Psychiatry

## 2022-02-26 DIAGNOSIS — F411 Generalized anxiety disorder: Secondary | ICD-10-CM | POA: Diagnosis not present

## 2022-02-26 DIAGNOSIS — G4700 Insomnia, unspecified: Secondary | ICD-10-CM

## 2022-02-26 DIAGNOSIS — F3341 Major depressive disorder, recurrent, in partial remission: Secondary | ICD-10-CM | POA: Diagnosis not present

## 2022-02-26 DIAGNOSIS — R69 Illness, unspecified: Secondary | ICD-10-CM

## 2022-02-26 DIAGNOSIS — Z9189 Other specified personal risk factors, not elsewhere classified: Secondary | ICD-10-CM

## 2022-02-26 NOTE — Progress Notes (Unsigned)
Psychotherapy Progress Note [facility] [name], PhD LP  Patient ID: Jamie Gamble [facility])    MRN: [id number] Therapy format: {Therapy Types:21967::"Individual psychotherapy"} Date: 02/26/2022      Start: ***:***     Stop: ***:***     Time Spent: *** min Location: {SvcLoc:22530::"In-person"}   Session narrative (presenting needs, interim history, self-report of stressors and symptoms, applications of prior therapy, status changes, and interventions made in session) Continues to enjoy [employer].  Learned today about Resignation Syndrome, where kids (often those whose parents are seeking asylum) become mute and unresponsive in the face of saturating anxiety, helplessness, and/or  Reveals she went mute at 76yo herself, when mother was probably getting shaken by the nest starting to empty, possibly dealing with father cheating.    For Thanksgiving, found herself alone all day.  Tried to contact other people she knew were bereft, but didn't do anything for her.  Did feel a bit abandoned by Jamie Gamble,  Maintenance man Jamie Gamble is becoming a regular friend.  Does have some schedule of social offerings  Offered possibility of guest attendance at a nearby church gathering.     Still tickled to find out how the orange glasses help her be sleep-ready.  Still morning grogginess, and still suspects OSA, sleep study coming Jan 9.    Discussed meds -- wants to relieve morning hangover, suggested back up dose times for bedtime meds, possibly reduce or drop her last hydroxyzine, and definitely consider backing up dose time.  Sees psychiatry tomorrow.    Therapeutic modalities: {AM:23362::"Cognitive Behavioral Therapy","Solution-Oriented/Positive Psychology"}  Mental Status/Observations:  Appearance:   {PSY:22683}     Behavior:  {PSY:21022743}  Motor:  {PSY:22302}  Speech/Language:   {PSY:22685}  Affect:  {PSY:22687}  Mood:  {PSY:31886}  Thought process:  {PSY:31888}  Thought  content:    {[phone]}  Sensory/Perceptual disturbances:    {PSY:[phone]}  Orientation:  {Psych Orientation:23301::"Fully oriented"}  Attention:  {Good-Fair-Poor ratings:23770::"Good"}    Concentration:  {Good-Fair-Poor ratings:23770::"Good"}  Memory:  {PSY:[phone]}  Insight:    {Good-Fair-Poor ratings:23770::"Good"}  Judgment:   {Good-Fair-Poor ratings:23770::"Good"}  Impulse Control:  {Good-Fair-Poor ratings:23770::"Good"}   Risk Assessment: Danger to Self: {Risk:22599::"No"} Self-injurious Behavior: {Risk:22599::"No"} Danger to Others: {Risk:22599::"No"} Physical Aggression / Violence: {Risk:22599::"No"} Duty to Warn: {AMYesNo:22526::"No"} Access to Firearms a concern: {AMYesNo:22526::"No"}  Assessment of progress:  {Progress:22147::"progressing"}  Diagnosis: No diagnosis found. Plan:  *** Other recommendations/advice as may be noted above Continue to utilize previously learned skills ad lib Maintain medication as prescribed and work faithfully with relevant prescriber(s) if any changes are desired or seem indicated Call the clinic on-call service, 988/hotline, 911, or present to [facility] or ER if any life-threatening psychiatric crisis No follow-ups on file. Already scheduled visit in this office 02/27/2022.  Robley Fries, PhD [name], PhD LP Clinical Psychologist, [facility] [address], [address] [place], [place] [address] [phone]

## 2022-02-27 ENCOUNTER — Ambulatory Visit: Payer: Medicare PPO | Admitting: Adult Health

## 2022-02-27 ENCOUNTER — Encounter: Payer: Self-pay | Admitting: Adult Health

## 2022-02-27 DIAGNOSIS — G4700 Insomnia, unspecified: Secondary | ICD-10-CM | POA: Diagnosis not present

## 2022-02-27 DIAGNOSIS — F411 Generalized anxiety disorder: Secondary | ICD-10-CM

## 2022-02-27 DIAGNOSIS — F3341 Major depressive disorder, recurrent, in partial remission: Secondary | ICD-10-CM | POA: Diagnosis not present

## 2022-02-27 NOTE — Progress Notes (Signed)
[name] CV:[id number] [birth year] 76 y.o.  Subjective:   Patient ID:  Jamie Gamble is a 76 y.o. (DOB [birth year]) female.  Chief Complaint: No chief complaint on file.   HPI JAYDALIS RESTA presents to the office today for follow-up of GAD, MDD and insomnia.  Previously seen by Dr Toy Care.   Seeing therapist - Dr. Rica Mote   Describes mood today as "better". Pleasant. Denies tearfulness. Mood symptoms - denies depression, anxiety, and irritability. Mood is consistent. Stating "I'm doing pretty good". Feels like medications are working well. Feels encouraged with upcoming sleep study. Has completed ECT treatments. Varying interest and motivation. Taking medications as prescribed.  Energy levels improved. Active, does not have a regular exercise routine.  Enjoys some usual interests and activities. Single. Lives alone. Not dating. No family local. Spending time with friends. Attends church.  Appetite adequate. Weight gain - 160 to 167 pounds. Sleep has improved. Averages 7 to 8 hours. Denies daytime napping. Upcoming sleep study. Focus and concentration improved - memory loss from ECT. Completing some tasks. Managing aspects of household. Retired at age 28 from [employer]. Denies SI or HI.   Denies AH or VH. Denies self harm. Denies substance use.   PHQ2-9    Flowsheet Row Video Visit from 02/11/2022 in [place] at [place] from 11/28/2021 in [place] Visit from 08/13/2021 in [place] at [place] from 04/04/2021 in [place] at [facility] from 02/27/2021 in [place] at [place]  PHQ-2 Total Score 0 2 0 0 0  PHQ-9 Total Score 4 7 0 -- 0      Flowsheet Row ED from 06/04/2021 in [place] DEPT  C-SSRS RISK CATEGORY No Risk        Review of Systems:  Review of Systems  Musculoskeletal:  Negative for gait problem.  Neurological:  Negative for tremors.   Psychiatric/Behavioral:         Please refer to HPI    Medications: I have reviewed the patient's current medications.  Current Outpatient Medications  Medication Sig Dispense Refill   atorvastatin (LIPITOR) 40 MG tablet Take 1 tablet (40 mg total) by mouth at bedtime. 90 tablet 1   FLUoxetine (PROZAC) 10 MG capsule Take 1 capsule (10 mg total) by mouth daily. 30 capsule 2   Glycerin-Hypromellose-PEG 400 (VISINE DRY EYE OP) Place 1 drop into both eyes daily as needed (dry eyes).     hydrochlorothiazide (MICROZIDE) 12.5 MG capsule TAKE ONE CAPSULE BY MOUTH ONCE DAILY 90 capsule 0   losartan (COZAAR) 25 MG tablet Take 1 tablet (25 mg total) by mouth daily. TAKE 1 TABLET(25 MG) BY MOUTH daily 90 tablet 1   pantoprazole (PROTONIX) 40 MG tablet Take 1 tablet (40 mg total) by mouth daily. 90 tablet 1   Probiotic Product (PROBIOTIC DAILY PO) Take 1 capsule by mouth daily.     QUEtiapine (SEROQUEL) 100 MG tablet Take one to three tablets at bedtime. 90 tablet 2   traZODone (DESYREL) 50 MG tablet Take 2-3 tablets (100-150 mg total) by mouth at bedtime. 90 tablet 2   No current facility-administered medications for this visit.    Medication Side Effects: None  Allergies:  Allergies  Allergen Reactions   Nexium [Esomeprazole] Diarrhea    Past Medical History:  Diagnosis Date   Depression    GERD (gastroesophageal reflux disease)    Hyperlipidemia    Hypertension     Past Medical History, Surgical history, Social  history, and Family history were reviewed and updated as appropriate.   Please see review of systems for further details on the patient's review from today.   Objective:   Physical Exam:  There were no vitals taken for this visit.  Physical Exam Constitutional:      General: She is not in acute distress. Musculoskeletal:        General: No deformity.  Neurological:     Mental Status: She is alert and oriented to person, place, and time.     Coordination:  Coordination normal.  Psychiatric:        Attention and Perception: Attention and perception normal. She does not perceive auditory or visual hallucinations.        Mood and Affect: Mood normal. Mood is not anxious or depressed. Affect is not labile, blunt, angry or inappropriate.        Speech: Speech normal.        Behavior: Behavior normal.        Thought Content: Thought content normal. Thought content is not paranoid or delusional. Thought content does not include homicidal or suicidal ideation. Thought content does not include homicidal or suicidal plan.        Cognition and Memory: Cognition and memory normal.        Judgment: Judgment normal.     Comments: Insight intact     Lab Review:     Component Value Date/Time   NA 140 08/13/2021 0905   K 4.6 08/13/2021 0905   CL 104 08/13/2021 0905   CO2 30 08/13/2021 0905   GLUCOSE 100 (H) 08/13/2021 0905   BUN 22 08/13/2021 0905   BUN 15 07/06/2019 0000   CREATININE 0.85 08/13/2021 0905   CALCIUM 9.3 08/13/2021 0905   PROT 6.8 08/13/2021 0905   ALBUMIN 4.0 08/13/2021 0905   AST 19 08/13/2021 0905   ALT 14 08/13/2021 0905   ALKPHOS 48 08/13/2021 0905   BILITOT 0.4 08/13/2021 0905   GFRNONAA >60 06/04/2021 1154       Component Value Date/Time   WBC 5.8 08/13/2021 0905   RBC 4.22 08/13/2021 0905   HGB 13.7 08/13/2021 0905   HCT 40.7 08/13/2021 0905   PLT 303.0 08/13/2021 0905   MCV 96.5 08/13/2021 0905   MCH 31.9 06/04/2021 1154   MCHC 33.7 08/13/2021 0905   RDW 13.8 08/13/2021 0905   LYMPHSABS 0.7 08/13/2021 0905   MONOABS 0.3 08/13/2021 0905   EOSABS 0.2 08/13/2021 0905   BASOSABS 0.1 08/13/2021 0905    No results found for: "POCLITH", "LITHIUM"   No results found for: "PHENYTOIN", "PHENOBARB", "VALPROATE", "CBMZ"   .res Assessment: Plan:    Plan:  Seeing [name] for therapy - 2 visits.  PDMP reviewed  Continue:  Prozac 10mg  daily Seroquel 100mg  - 1 tablet bedtime for sleep Trazadone 50mg  -  taking one tablet at bedtime for sleep  D/C Hydroxyzine 25mg  - 1 at hs for sleep  Using Melatonin 5mg  at hs  Followed by - therapy    Sleep study scheduled for January 9th.  RTC 2 weeks  Patient advised to contact office with any questions, adverse effects, or acute worsening in signs and symptoms.  Time spent with patient was 25 minutes. Greater than 50% of face to face time with patient was spent on counseling and coordination of care.    Discussed potential metabolic side effects associated with atypical antipsychotics, as well as potential risk for movement side effects. Advised pt to contact  office if movement side effects occur.   There are no diagnoses linked to this encounter.   Please see After Visit Summary for patient specific instructions.  Future Appointments  Date Time Provider [place]  03/12/2022  2:00 PM Blanchie Serve, PhD CP-CP None  03/31/2022  2:00 PM Blanchie Serve, PhD CP-CP None  04/21/2022  2:00 PM Blanchie Serve, PhD CP-CP None  05/13/2022  2:00 PM Blanchie Serve, PhD CP-CP None  06/03/2022  2:00 PM Blanchie Serve, PhD CP-CP None    No orders of the defined types were placed in this encounter.   -------------------------------

## 2022-03-12 ENCOUNTER — Ambulatory Visit (INDEPENDENT_AMBULATORY_CARE_PROVIDER_SITE_OTHER): Payer: Medicare PPO | Admitting: Psychiatry

## 2022-03-12 DIAGNOSIS — R69 Illness, unspecified: Secondary | ICD-10-CM

## 2022-03-12 DIAGNOSIS — G4700 Insomnia, unspecified: Secondary | ICD-10-CM

## 2022-03-12 DIAGNOSIS — F411 Generalized anxiety disorder: Secondary | ICD-10-CM | POA: Diagnosis not present

## 2022-03-12 DIAGNOSIS — Z9189 Other specified personal risk factors, not elsewhere classified: Secondary | ICD-10-CM | POA: Diagnosis not present

## 2022-03-12 DIAGNOSIS — F3341 Major depressive disorder, recurrent, in partial remission: Secondary | ICD-10-CM

## 2022-03-12 NOTE — Progress Notes (Signed)
Psychotherapy Progress Note [facility] [name], PhD LP  Patient ID: Jamie Gamble [facility])    MRN: [id number] Therapy format: Individual psychotherapy Date: 03/12/2022      Start: 2:10p     Stop: 3:00p     Time Spent: 50 min Location: In-person   Session narrative (presenting needs, interim history, self-report of stressors and symptoms, applications of prior therapy, status changes, and interventions made in session) Working on getting her sleep study scheduled, now Jan 9 through [facility].  Tried dropping last hydroxyzine one night but too difficult to sleep.  Probed dosing strategy, discovered typically a 2-hr gap from night meds c. 9p to bed time c. 11p.  Discussed likelihood she may be missing effective window for sleep-assisting medication, encouraged close the gap to one hour, see.    Would like to work on self-esteem going forward.  Has it from her mother that father, though beloved, was critical.  Does recall him being very stern sometimes.  Discussed influences forming negative self-opinion, encouraged in benefiting from continuing to work out her living conditions and relationships and seeing herself more effective.  Therapeutic modalities: Cognitive Behavioral Therapy, Solution-Oriented/Positive Psychology, and Ego-Supportive  Mental Status/Observations:  Appearance:   Casual and Neat     Behavior:  Appropriate  Motor:  Normal  Speech/Language:   Clear and Coherent  Affect:  Appropriate  Mood:  dysthymic  Thought process:  normal  Thought content:    WNL  Sensory/Perceptual disturbances:    WNL  Orientation:  Fully oriented  Attention:  Good    Concentration:  Fair  Memory:  WNL  Insight:    Good  Judgment:   Good  Impulse Control:  Good   Risk Assessment: Danger to Self: No Self-injurious Behavior: No Danger to Others: No Physical Aggression / Violence: No Duty to Warn: No Access to Firearms a concern: No  Assessment of progress:   progressing  Diagnosis:   ICD-10-CM   1. Major depressive disorder, recurrent, in partial remission (HCC)  F33.41     2. Generalized anxiety disorder  F41.1     3. Insomnia, unspecified type  G47.00     4. r/o sleep apnea (likely)  R69     5. At increased risk for social isolation  Z91.89      Plan:  Sleep regulation -- Continue orange glasses QPM to facilitate circadian signalling, sleep readiness, sleep hardiness.  Confer with psychiatry, but probably worth adjust gap from dosing to bedtime to just 1 hr.  Option add melatonin 2-5mg  1-2 hr ahead of bed.  Practice sleep hygiene, with comfortable arrangements, lights reduced, and all caffeine stopped well ahead of bedtime (at least 6 hrs).  Use soothing imagery PRN for DFA.  For worries, write down worry thoughts and consciously decide whether to stay up and think or lay them down and sleep.  Continue with second-opinion sleep assessment to confirm/refute suspected sleep apnea. Physiological mood factors -- Start the day with good lighting, movement/stretching, and a few minutes grounding, outdoors if available/desired.  May need to check vitamin levels incl B12 and D, anemia. Weight management -- OK with seeking medication, but make sure to stay mobile, watch portions, control carbs.  May observe practice of leaving a bite, work toward foods in the MIND Diet. For self-esteem -- PRN practices of blessing-counting and listing ways people have told/shown her she is lovable/capable, without any requirement to believe or agree, just consider.  Seek to notice and give self  credit when she does anything constructive or principled for things that matter to her wellbeing or sense of purpose. Intrusive guilt/shame thoughts -- As needed, self-affirm God would not be punishing her, it's a trick of depression. Activities -- Develop activities of interest, for days with and without companions.  OK with working, provided it is sustainable.Financial concerns --  Follow through fact-finding and meeting with advisor, in coordination with [name].  Still OK to seek part-time work she can reach from her home.  Maintain cordial working relationship with nephew/trustee. Support system/social involvement -- Maintain system of friends, [employer], any available at housing community.  Re-engage available friends actively, and dispute worry thoughts of being judged.  Senior Resources available for supportive calls and possible facility-based recreation.  Home care companions if needed. Placement concerns -- Independent living with PRN assistance should be fine at this time.  Available on request to interpret her condition to family members and work out concerns. ECT -- On hold, is cognitively recovered, and the therapy is highly anxiety-provoking for her.  Pt understands and accepts the burden to notify treatment team promptly of relapse in dark/dreadful mood, severe demotivation, or intractable insomnia. Other recommendations/advice as may be noted above Continue to utilize previously learned skills ad lib Maintain medication as prescribed and work faithfully with relevant prescriber(s) if any changes are desired or seem indicated Call the clinic on-call service, 988/hotline, 911, or present to [facility] or ER if any life-threatening psychiatric crisis Return for as already scheduled. Already scheduled visit in this office 03/27/2022.  Robley Fries, PhD [name], PhD LP Clinical Psychologist, [facility] [address], [address] [place], [place] [address] [phone]

## 2022-03-27 ENCOUNTER — Encounter: Payer: Self-pay | Admitting: Adult Health

## 2022-03-27 ENCOUNTER — Ambulatory Visit (INDEPENDENT_AMBULATORY_CARE_PROVIDER_SITE_OTHER): Payer: Medicare PPO | Admitting: Adult Health

## 2022-03-27 DIAGNOSIS — F411 Generalized anxiety disorder: Secondary | ICD-10-CM

## 2022-03-27 DIAGNOSIS — F3341 Major depressive disorder, recurrent, in partial remission: Secondary | ICD-10-CM

## 2022-03-27 DIAGNOSIS — G4700 Insomnia, unspecified: Secondary | ICD-10-CM

## 2022-03-27 NOTE — Progress Notes (Signed)
Jamie Gamble [id number] 09-04-1945 77 y.o.  Virtual Visit via Telephone Note  I connected with pt on 03/27/22 at 12:00 PM EST by telephone and verified that I am speaking with the correct person using two identifiers.   I discussed the limitations, risks, security and privacy concerns of performing an evaluation and management service by telephone and the availability of in person appointments. I also discussed with the patient that there may be a patient responsible charge related to this service. The patient expressed understanding and agreed to proceed.   I discussed the assessment and treatment plan with the patient. The patient was provided an opportunity to ask questions and all were answered. The patient agreed with the plan and demonstrated an understanding of the instructions.   The patient was advised to call back or seek an in-person evaluation if the symptoms worsen or if the condition fails to improve as anticipated.  I provided 11 minutes of non-face-to-face time during this encounter.  The patient was located at home.  The provider was located at [place].   Aloha Gell, NP   Subjective:   Patient ID:  Jamie Gamble is a 77 y.o. (DOB [birth year]) female.  Chief Complaint: No chief complaint on file.   HPI Andris Baumann presents for follow-up of GAD, MDD and insomnia.  Previously seen by Dr Toy Care.   Seeing therapist - Dr. Rica Mote   Describes mood today as "better". Pleasant. Denies tearfulness. Mood symptoms - denies depression, anxiety, and irritability. Mood is consistent. Stating "I'm doing alright". Feels like medications are working well. Upcoming sleep study. Has completed ECT treatments. Varying interest and motivation. Taking medications as prescribed.  Energy levels lower. Active, does not have a regular exercise routine.  Enjoys some usual interests and activities. Single. Lives alone. Not dating. No family local. Spending time with friends.  Attends church.  Appetite adequate. Weight stable - 167 pounds. Sleep has improved. Averages 7 to 8 hours. Denies daytime napping. Upcoming sleep study. Focus and concentration "ok". Completing some tasks. Managing aspects of household. Retired at age 15 from [employer]. Denies SI or HI.   Denies AH or VH. Denies self harm. Denies substance use.    Review of Systems:  Review of Systems  Musculoskeletal:  Negative for gait problem.  Neurological:  Negative for tremors.  Psychiatric/Behavioral:         Please refer to HPI    Medications: I have reviewed the patient's current medications.  Current Outpatient Medications  Medication Sig Dispense Refill   atorvastatin (LIPITOR) 40 MG tablet Take 1 tablet (40 mg total) by mouth at bedtime. 90 tablet 1   FLUoxetine (PROZAC) 10 MG capsule Take 1 capsule (10 mg total) by mouth daily. 30 capsule 2   Glycerin-Hypromellose-PEG 400 (VISINE DRY EYE OP) Place 1 drop into both eyes daily as needed (dry eyes).     hydrochlorothiazide (MICROZIDE) 12.5 MG capsule TAKE ONE CAPSULE BY MOUTH ONCE DAILY 90 capsule 0   losartan (COZAAR) 25 MG tablet Take 1 tablet (25 mg total) by mouth daily. TAKE 1 TABLET(25 MG) BY MOUTH daily 90 tablet 1   pantoprazole (PROTONIX) 40 MG tablet Take 1 tablet (40 mg total) by mouth daily. 90 tablet 1   Probiotic Product (PROBIOTIC DAILY PO) Take 1 capsule by mouth daily.     QUEtiapine (SEROQUEL) 100 MG tablet Take one to three tablets at bedtime. 90 tablet 2   traZODone (DESYREL) 50 MG tablet Take 2-3 tablets (100-150 mg  total) by mouth at bedtime. 90 tablet 2   No current facility-administered medications for this visit.    Medication Side Effects: None  Allergies:  Allergies  Allergen Reactions   Nexium [Esomeprazole] Diarrhea    Past Medical History:  Diagnosis Date   Depression    GERD (gastroesophageal reflux disease)    Hyperlipidemia    Hypertension     Family History  Problem Relation Age of Onset    Depression Mother    Cancer Father    Depression Brother     Social History   Socioeconomic History   Marital status: Single    Spouse name: Not on file   Number of children: 0   Years of education: Not on file   Highest education level: Doctorate  Occupational History   Occupation: Retired [employer] Employee  Tobacco Use   Smoking status: Former   Smokeless tobacco: Never  Building services engineer Use: Never used  Substance and Sexual Activity   Alcohol use: Not Currently   Drug use: Never   Sexual activity: Not on file  Other Topics Concern   Not on file  Social History Narrative   Not on file   Social Determinants of Health   Financial Resource Strain: Low Risk  (02/26/2021)   Overall Financial Resource Strain (CARDIA)    Difficulty of Paying Living Expenses: Not hard at all  Food Insecurity: No Food Insecurity (02/26/2021)   Hunger Vital Sign    Worried About Running Out of Food in the Last Year: Never true    Ran Out of Food in the Last Year: Never true  Transportation Needs: No Transportation Needs (02/26/2021)   PRAPARE - Administrator, Civil Service (Medical): No    Lack of Transportation (Non-Medical): No  Physical Activity: Sufficiently Active (02/26/2021)   Exercise Vital Sign    Days of Exercise per Week: 7 days    Minutes of Exercise per Session: 30 min  Stress: No Stress Concern Present (02/26/2021)   [employer] of Occupational Health - Occupational Stress Questionnaire    Feeling of Stress : Not at all  Social Connections: Unknown (02/26/2021)   Social Connection and Isolation Panel [NHANES]    Frequency of Communication with Friends and Family: More than three times a week    Frequency of Social Gatherings with Friends and Family: More than three times a week    Attends Religious Services: More than 4 times per year    Active Member of [employer] or Organizations: Yes    Attends Engineer, structural: More than 4 times per year     Marital Status: Patient refused  Intimate Partner Violence: Not on file    Past Medical History, Surgical history, Social history, and Family history were reviewed and updated as appropriate.   Please see review of systems for further details on the patient's review from today.   Objective:   Physical Exam:  There were no vitals taken for this visit.  Physical Exam Constitutional:      General: She is not in acute distress. Musculoskeletal:        General: No deformity.  Neurological:     Mental Status: She is alert and oriented to person, place, and time.     Coordination: Coordination normal.  Psychiatric:        Attention and Perception: Attention and perception normal. She does not perceive auditory or visual hallucinations.        Mood and  Affect: Mood normal. Mood is not anxious or depressed. Affect is not labile, blunt, angry or inappropriate.        Speech: Speech normal.        Behavior: Behavior normal.        Thought Content: Thought content normal. Thought content is not paranoid or delusional. Thought content does not include homicidal or suicidal ideation. Thought content does not include homicidal or suicidal plan.        Cognition and Memory: Cognition and memory normal.        Judgment: Judgment normal.     Comments: Insight intact     Lab Review:     Component Value Date/Time   NA 140 08/13/2021 0905   K 4.6 08/13/2021 0905   CL 104 08/13/2021 0905   CO2 30 08/13/2021 0905   GLUCOSE 100 (H) 08/13/2021 0905   BUN 22 08/13/2021 0905   BUN 15 07/06/2019 0000   CREATININE 0.85 08/13/2021 0905   CALCIUM 9.3 08/13/2021 0905   PROT 6.8 08/13/2021 0905   ALBUMIN 4.0 08/13/2021 0905   AST 19 08/13/2021 0905   ALT 14 08/13/2021 0905   ALKPHOS 48 08/13/2021 0905   BILITOT 0.4 08/13/2021 0905   GFRNONAA >60 06/04/2021 1154       Component Value Date/Time   WBC 5.8 08/13/2021 0905   RBC 4.22 08/13/2021 0905   HGB 13.7 08/13/2021 0905   HCT 40.7  08/13/2021 0905   PLT 303.0 08/13/2021 0905   MCV 96.5 08/13/2021 0905   MCH 31.9 06/04/2021 1154   MCHC 33.7 08/13/2021 0905   RDW 13.8 08/13/2021 0905   LYMPHSABS 0.7 08/13/2021 0905   MONOABS 0.3 08/13/2021 0905   EOSABS 0.2 08/13/2021 0905   BASOSABS 0.1 08/13/2021 0905    No results found for: "POCLITH", "LITHIUM"   No results found for: "PHENYTOIN", "PHENOBARB", "VALPROATE", "CBMZ"   .res Assessment: Plan:    Plan:  Seeing Luan Moore for therapy - 2 visits.  PDMP reviewed  Continue:  Prozac 10mg  daily  Seroquel 100mg  - 1 tablet bedtime for sleep Trazadone 50mg  - taking one tablet at bedtime for sleep Hydroxyzine 25mg  - 1 at hs for sleep Melatonin 5mg  at hs  Followed by Luan Moore - therapy    Sleep study scheduled for January 9th.  RTC 2/4 weeks  Patient advised to contact office with any questions, adverse effects, or acute worsening in signs and symptoms.  Time spent with patient was 25 minutes. Greater than 50% of face to face time with patient was spent on counseling and coordination of care.    Discussed potential metabolic side effects associated with atypical antipsychotics, as well as potential risk for movement side effects. Advised pt to contact office if movement side effects occur.   Diagnoses and all orders for this visit:  Major depressive disorder, recurrent, in partial remission ([place])  Generalized anxiety disorder  Insomnia, unspecified type     Please see After Visit Summary for patient specific instructions.  Future Appointments  Date Time Provider [place]  03/31/2022  2:00 PM Blanchie Serve, PhD CP-CP None  04/21/2022  2:00 PM Blanchie Serve, PhD CP-CP None  05/13/2022  2:00 PM Blanchie Serve, PhD CP-CP None  06/03/2022  2:00 PM Blanchie Serve, PhD CP-CP None  06/24/2022 11:00 AM Blanchie Serve, PhD CP-CP None    No orders of the defined types were placed in this encounter.      -------------------------------

## 2022-03-28 ENCOUNTER — Other Ambulatory Visit: Payer: Self-pay | Admitting: Adult Health

## 2022-03-28 DIAGNOSIS — F411 Generalized anxiety disorder: Secondary | ICD-10-CM

## 2022-03-28 DIAGNOSIS — F331 Major depressive disorder, recurrent, moderate: Secondary | ICD-10-CM

## 2022-03-31 ENCOUNTER — Ambulatory Visit (INDEPENDENT_AMBULATORY_CARE_PROVIDER_SITE_OTHER): Payer: Medicare PPO | Admitting: Psychiatry

## 2022-03-31 DIAGNOSIS — Z9189 Other specified personal risk factors, not elsewhere classified: Secondary | ICD-10-CM

## 2022-03-31 DIAGNOSIS — G4700 Insomnia, unspecified: Secondary | ICD-10-CM | POA: Diagnosis not present

## 2022-03-31 DIAGNOSIS — F411 Generalized anxiety disorder: Secondary | ICD-10-CM

## 2022-03-31 DIAGNOSIS — R69 Illness, unspecified: Secondary | ICD-10-CM

## 2022-03-31 DIAGNOSIS — F3341 Major depressive disorder, recurrent, in partial remission: Secondary | ICD-10-CM | POA: Diagnosis not present
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
## 2022-04-14 ENCOUNTER — Encounter: Payer: Self-pay | Admitting: Adult Health

## 2022-04-14 ENCOUNTER — Ambulatory Visit (INDEPENDENT_AMBULATORY_CARE_PROVIDER_SITE_OTHER): Payer: Medicare PPO | Admitting: Adult Health

## 2022-04-14 DIAGNOSIS — G4700 Insomnia, unspecified: Secondary | ICD-10-CM | POA: Diagnosis not present

## 2022-04-14 DIAGNOSIS — F3341 Major depressive disorder, recurrent, in partial remission: Secondary | ICD-10-CM | POA: Diagnosis not present

## 2022-04-14 DIAGNOSIS — F411 Generalized anxiety disorder: Secondary | ICD-10-CM

## 2022-04-14 NOTE — Progress Notes (Signed)
RAIDEN YEARWOOD [id number] 1945-06-29 77 y.o.  Subjective:   Patient ID:  Jamie Gamble is a 77 y.o. (DOB Mar 31, 1945) female.  Chief Complaint: No chief complaint on file.   HPI  Jamie Gamble presents to the office today for follow-up of GAD, MDD and insomnia.  Previously seen by Dr Toy Care.   Seeing therapist - Dr. Rica Mote   Describes mood today as "better". Pleasant. Denies tearfulness. Mood symptoms - denies depression, anxiety, and irritability. Mood is consistent. Stating "I'm doing alright". Feels like medications are working well. Upcoming sleep study - February. Varying interest and motivation. Taking medications as prescribed.  Energy levels lower. Active, does not have a regular exercise routine.  Enjoys some usual interests and activities. Single. Lives alone. Not dating. No family local. Spending time with friends. Attends church.  Appetite adequate. Weight stable - 167 pounds. Sleep has improved. Averages 7 hours. Denies daytime napping. Upcoming sleep study. Focus and concentration "ok". Completing some tasks. Managing aspects of household. Retired at age 89 from [employer]. Denies SI or HI.   Denies AH or VH. Denies self harm. Denies substance use.   Has completed ECT treatments.   PHQ2-9    Flowsheet Row Video Visit from 02/11/2022 in [place] at [place] from 11/28/2021 in [place] Office Visit from 08/13/2021 in [place] at [place] from 04/04/2021 in [place] at [place] from 02/27/2021 in [place] at [place]  PHQ-2 Total Score 0 2 0 0 0  PHQ-9 Total Score 4 7 0 -- 0      [place] ED from 06/04/2021 in [facility] Emergency Department at [place] No Risk        Review of Systems:  Review of Systems  Musculoskeletal:  Negative for gait problem.  Neurological:  Negative  for tremors.  Psychiatric/Behavioral:         Please refer to HPI    Medications: I have reviewed the patient's current medications.  Current Outpatient Medications  Medication Sig Dispense Refill   atorvastatin (LIPITOR) 40 MG tablet Take 1 tablet (40 mg total) by mouth at bedtime. 90 tablet 1   FLUoxetine (PROZAC) 10 MG capsule TAKE ONE CAPSULE BY MOUTH DAILY 30 capsule 2   Glycerin-Hypromellose-PEG 400 (VISINE DRY EYE OP) Place 1 drop into both eyes daily as needed (dry eyes).     hydrochlorothiazide (MICROZIDE) 12.5 MG capsule TAKE ONE CAPSULE BY MOUTH ONCE DAILY 90 capsule 0   losartan (COZAAR) 25 MG tablet Take 1 tablet (25 mg total) by mouth daily. TAKE 1 TABLET(25 MG) BY MOUTH daily 90 tablet 1   pantoprazole (PROTONIX) 40 MG tablet Take 1 tablet (40 mg total) by mouth daily. 90 tablet 1   Probiotic Product (PROBIOTIC DAILY PO) Take 1 capsule by mouth daily.     QUEtiapine (SEROQUEL) 100 MG tablet Take one to three tablets at bedtime. 90 tablet 2   traZODone (DESYREL) 50 MG tablet Take 2-3 tablets (100-150 mg total) by mouth at bedtime. 90 tablet 2   No current facility-administered medications for this visit.    Medication Side Effects: None  Allergies:  Allergies  Allergen Reactions   Nexium [Esomeprazole] Diarrhea    Past Medical History:  Diagnosis Date   Depression    GERD (gastroesophageal reflux disease)    Hyperlipidemia    Hypertension     Past Medical History, Surgical  history, Social history, and Family history were reviewed and updated as appropriate.   Please see review of systems for further details on the patient's review from today.   Objective:   Physical Exam:  There were no vitals taken for this visit.  Physical Exam Constitutional:      General: She is not in acute distress. Musculoskeletal:        General: No deformity.  Neurological:     Mental Status: She is alert and oriented to person, place, and time.     Coordination:  Coordination normal.  Psychiatric:        Attention and Perception: Attention and perception normal. She does not perceive auditory or visual hallucinations.        Mood and Affect: Mood normal. Mood is not anxious or depressed. Affect is not labile, blunt, angry or inappropriate.        Speech: Speech normal.        Behavior: Behavior normal.        Thought Content: Thought content normal. Thought content is not paranoid or delusional. Thought content does not include homicidal or suicidal ideation. Thought content does not include homicidal or suicidal plan.        Cognition and Memory: Cognition and memory normal.        Judgment: Judgment normal.     Comments: Insight intact     Lab Review:     Component Value Date/Time   NA 140 08/13/2021 0905   K 4.6 08/13/2021 0905   CL 104 08/13/2021 0905   CO2 30 08/13/2021 0905   GLUCOSE 100 (H) 08/13/2021 0905   BUN 22 08/13/2021 0905   BUN 15 07/06/2019 0000   CREATININE 0.85 08/13/2021 0905   CALCIUM 9.3 08/13/2021 0905   PROT 6.8 08/13/2021 0905   ALBUMIN 4.0 08/13/2021 0905   AST 19 08/13/2021 0905   ALT 14 08/13/2021 0905   ALKPHOS 48 08/13/2021 0905   BILITOT 0.4 08/13/2021 0905   GFRNONAA >60 06/04/2021 1154       Component Value Date/Time   WBC 5.8 08/13/2021 0905   RBC 4.22 08/13/2021 0905   HGB 13.7 08/13/2021 0905   HCT 40.7 08/13/2021 0905   PLT 303.0 08/13/2021 0905   MCV 96.5 08/13/2021 0905   MCH 31.9 06/04/2021 1154   MCHC 33.7 08/13/2021 0905   RDW 13.8 08/13/2021 0905   LYMPHSABS 0.7 08/13/2021 0905   MONOABS 0.3 08/13/2021 0905   EOSABS 0.2 08/13/2021 0905   BASOSABS 0.1 08/13/2021 0905    No results found for: "POCLITH", "LITHIUM"   No results found for: "PHENYTOIN", "PHENOBARB", "VALPROATE", "CBMZ"   .res Assessment: Plan:    Plan:  Seeing [name] for therapy   PDMP reviewed  Continue:  Prozac 10mg  daily  Seroquel 100mg  - 1 tablet bedtime for sleep Trazadone 50mg  - taking one  tablet at bedtime for sleep Hydroxyzine 25mg  - 1 at hs for sleep Melatonin 5mg  at hs  Followed by - therapy    Sleep study scheduled for January 9th.  RTC 2/4 weeks  Patient advised to contact office with any questions, adverse effects, or acute worsening in signs and symptoms.  Time spent with patient was 25 minutes. Greater than 50% of face to face time with patient was spent on counseling and coordination of care.    Discussed potential metabolic side effects associated with atypical antipsychotics, as well as potential risk for movement side effects. Advised pt to contact office if movement  side effects occur.    Diagnoses and all orders for this visit:  Major depressive disorder, recurrent, in partial remission ([place])  Generalized anxiety disorder  Insomnia, unspecified type     Please see After Visit Summary for patient specific instructions.  Future Appointments  Date Time Provider [place]  04/21/2022  2:00 PM Blanchie Serve, PhD CP-CP None  05/13/2022  2:00 PM Blanchie Serve, PhD CP-CP None  06/03/2022  2:00 PM Blanchie Serve, PhD CP-CP None  06/24/2022 11:00 AM Blanchie Serve, PhD CP-CP None  07/15/2022 11:00 AM Blanchie Serve, PhD CP-CP None    No orders of the defined types were placed in this encounter.   -------------------------------

## 2022-04-21 ENCOUNTER — Ambulatory Visit (INDEPENDENT_AMBULATORY_CARE_PROVIDER_SITE_OTHER): Payer: Medicare PPO | Admitting: Psychiatry

## 2022-04-21 DIAGNOSIS — R69 Illness, unspecified: Secondary | ICD-10-CM | POA: Diagnosis not present

## 2022-04-21 DIAGNOSIS — G4700 Insomnia, unspecified: Secondary | ICD-10-CM | POA: Diagnosis not present

## 2022-04-21 DIAGNOSIS — F411 Generalized anxiety disorder: Secondary | ICD-10-CM | POA: Diagnosis not present

## 2022-04-21 DIAGNOSIS — F3341 Major depressive disorder, recurrent, in partial remission: Secondary | ICD-10-CM

## 2022-04-21 NOTE — Progress Notes (Signed)
Psychotherapy Progress Note [facility] Jamie Moore, PhD LP  Patient ID: Jamie Gamble [facility])    MRN: [id number] Therapy format: Individual psychotherapy Date: 04/21/2022      Start: 2:15p     Stop: 3:00p     Time Spent: 45 min Location: In-person   Session narrative (presenting needs, interim history, self-report of stressors and symptoms, applications of prior therapy, status changes, and interventions made in session) Sleep study rescheduled but it was MLK Day, and no one was at the lab nor answering the emergency line when she tried to go.  Felt roughly handled by that, and by what seemed to be shoddy, insensitive service, but following through on RS Feb 11.  Finds herself making absent-minded mistakes, attributed to being overtired, e.g., used shaving cream for shampoo, forgot time on microwave, got off on wrong floor today.  Not paranoid, per se, nor relapsing in catatonic depression, just more inattentive.  Interpreted as highly likely sleep deprivation.  Coached in applying sleep readiness techniques, especially explicit consent to sleep, explicitly trusting the night, writing out worries, and freedom to get up if it's too difficult.  Therapeutic modalities: Cognitive Behavioral Therapy, Solution-Oriented/Positive Psychology, Ego-Supportive, and Psycho-education/Bibliotherapy  Mental Status/Observations:  Appearance:   Casual and Neat     Behavior:  Appropriate  Motor:  Normal  Speech/Language:   Clear and Coherent  Affect:  Appropriate  Mood:  anxious and dysthymic  Thought process:  normal  Thought content:    WNL  Sensory/Perceptual disturbances:    WNL  Orientation:  Fully oriented  Attention:  Good    Concentration:  Fair  Memory:  WNL  Insight:    Good  Judgment:   Good  Impulse Control:  Good   Risk Assessment: Danger to Self: No Self-injurious Behavior: No Danger to Others: No Physical Aggression / Violence: No Duty to Warn: No Access to  Firearms a concern: No  Assessment of progress:  progressing  Diagnosis:   ICD-10-CM   1. Major depressive disorder, recurrent, in partial remission (HCC)  F33.41     2. Generalized anxiety disorder  F41.1     3. Insomnia, unspecified type  G47.00     4. r/o sleep apnea  R69      Plan:  Sleep regulation -- Continue orange glasses QPM to facilitate circadian signalling, sleep readiness, sleep hardiness.  Confer with psychiatry, but probably worth adjust gap from dosing to bedtime to just 1 hr.  Option add melatonin 2-54m 1-2 hr ahead of bed.  Practice sleep hygiene, with comfortable arrangements, lights reduced, and all caffeine stopped well ahead of bedtime (at least 6 hrs).  Use soothing imagery PRN for DFA.  For worries, write down worry thoughts and consciously decide whether to stay up and think or lay them down and sleep.  Continue with second-opinion sleep assessment to confirm/refute suspected sleep apnea. Physiological mood factors -- Start the day with good lighting, movement/stretching, and a few minutes grounding, outdoors if available/desired.  May need to check vitamin levels incl B12 and D, anemia. Weight management -- OK with seeking medication, but make sure to stay mobile, watch portions, control carbs.  May observe practice of leaving a bite, work toward foods in the [place] self-esteem -- PRN practices of blessing-counting and listing ways people have told/shown her she is lovable/capable, without any requirement to believe or agree, just consider.  Seek to notice and give self credit when she does anything constructive or principled for  things that matter to her wellbeing or sense of purpose. Intrusive guilt/shame thoughts -- As needed, self-affirm God would not be punishing her, it's a trick of depression. Activities -- Develop activities of interest, for days with and without companions.  OK with working, provided it is sustainable. Financial concerns -- Follow  through fact-finding and meeting with advisor, in coordination with friend/POA Webb Silversmith.  Still OK to seek part-time work she can reach from her home.  Maintain cordial working relationship with nephew/trustee. Support system/social involvement -- Maintain system of friends, Toastmaster[employer], any available at housing community.  Re-engage available friends actively, and dispute worry thoughts of being judged.  Sen[employer] available for supportive calls and possible facility-based recreation.  Home care companions if needed. Placement concerns -- Independent living with PRN assistance should be fine at this time.  Available on request to interpret her condition to family members and work out concerns. ECT -- On hold, is cognitively recovered, and the therapy is highly anxiety-provoking for her.  Pt understands and accepts the burden to notify treatment team promptly of relapse in dark/dreadful mood, severe demotivation, or intractable insomnia.Other recommendations/advice as may be noted above Other recommendations/advice as may be noted above Continue to utilize previously learned skills ad lib Maintain medication as prescribed and work faithfully with relevant prescriber(s) if any changes are desired or seem indicated Call the clinic on-call service, 988/hotline, 911, or present to [facility] or ER if any life-threatening psychiatric crisis Return in about 2 weeks (around 05/05/2022) for time as available. Already scheduled visit in this office 05/07/2022.  Blanchie Serve, PhD Jamie Moore, PhD LP Clinical Psychologist, [facility] [address] [place], [place] [address] [phone]

## 2022-04-30 ENCOUNTER — Other Ambulatory Visit: Payer: Self-pay | Admitting: Internal Medicine

## 2022-05-03 NOTE — Progress Notes (Incomplete)
Psychotherapy Progress Note [facility] Luan Moore, PhD LP  Patient ID: Jamie Gamble [facility])    MRN: [id number] Therapy format: Individual psychotherapy Date: 04/21/2022      Start: 2:15p     Stop: 3:00p     Time Spent: 45 min Location: In-person   Session narrative (presenting needs, interim history, self-report of stressors and symptoms, applications of prior therapy, status changes, and interventions made in session) Sleep study rescheduled but it was MLK Day, and no one was at the lab nor answering the emergency line when she tried to go.  Felt roughly handled by that, and by what seemed to be shoddy, insensitive service, but following through on RS Feb 11. Finds herself making absent-minded mistakes, attributed to being overtired, e.g., used shaving cream for shampoo, forgot time on microwave, got off on wrong floor today.  Not paranoid, per se, nor relapsing in catatonic depression, just more inattentive.  Interpreted as highly likely sleep deprivation.  Coached in applying sleep readiness techniques, especially explicit consent to sleep, explicitly trusting the night, writing out worries, and freedom to get up if it's too difficult.  Therapeutic modalities: Cognitive Behavioral Therapy, Solution-Oriented/Positive Psychology, Ego-Supportive, and Psycho-education/Bibliotherapy  Mental Status/Observations:  Appearance:   Casual and Neat     Behavior:  Appropriate  Motor:  Normal  Speech/Language:   Clear and Coherent  Affect:  Appropriate  Mood:  anxious and dysthymic  Thought process:  normal  Thought content:    WNL  Sensory/Perceptual disturbances:    WNL  Orientation:  Fully oriented  Attention:  Good    Concentration:  Fair  Memory:  WNL  Insight:    Good  Judgment:   Good  Impulse Control:  Good   Risk Assessment: Danger to Self: No Self-injurious Behavior: No Danger to Others: No Physical Aggression / Violence: No Duty to Warn: No Access to  Firearms a concern: No  Assessment of progress:  progressing  Diagnosis:   ICD-10-CM   1. Major depressive disorder, recurrent, in partial remission (HCC)  F33.41     2. Generalized anxiety disorder  F41.1     3. Insomnia, unspecified type  G47.00     4. r/o sleep apnea  R69      Plan:  *** Other recommendations/advice as may be noted above Continue to utilize previously learned skills ad lib Maintain medication as prescribed and work faithfully with relevant prescriber(s) if any changes are desired or seem indicated Call the clinic on-call service, 988/hotline, 911, or present to [facility] or ER if any life-threatening psychiatric crisis Return in about 2 weeks (around 05/05/2022) for time as available. Already scheduled visit in this office 05/07/2022.  Blanchie Serve, PhD Luan Moore, PhD LP Clinical Psychologist, [facility] [address], [place], [place] [address] [phone]

## 2022-05-07 ENCOUNTER — Encounter: Payer: Self-pay | Admitting: Adult Health

## 2022-05-07 ENCOUNTER — Telehealth (INDEPENDENT_AMBULATORY_CARE_PROVIDER_SITE_OTHER): Payer: Medicare PPO | Admitting: Adult Health

## 2022-05-07 DIAGNOSIS — F3341 Major depressive disorder, recurrent, in partial remission: Secondary | ICD-10-CM

## 2022-05-07 DIAGNOSIS — F411 Generalized anxiety disorder: Secondary | ICD-10-CM

## 2022-05-07 DIAGNOSIS — G4700 Insomnia, unspecified: Secondary | ICD-10-CM

## 2022-05-07 NOTE — Progress Notes (Signed)
Jamie Gamble [id number] Jan 25, 1946 77 y.o.  Virtual Visit via Video Note  I connected with pt @ on 05/07/22 at 10:40 AM EST by a video enabled telemedicine application and verified that I am speaking with the correct person using two identifiers.   I discussed the limitations of evaluation and management by telemedicine and the availability of in person appointments. The patient expressed understanding and agreed to proceed.  I discussed the assessment and treatment plan with the patient. The patient was provided an opportunity to ask questions and all were answered. The patient agreed with the plan and demonstrated an understanding of the instructions.   The patient was advised to call back or seek an in-person evaluation if the symptoms worsen or if the condition fails to improve as anticipated.  I provided 20 minutes of non-face-to-face time during this encounter.  The patient was located at home.  The provider was located at [place].   Aloha Gell, NP   Subjective:   Patient ID:  Jamie Gamble is a 77 y.o. (DOB [birth year]) female.  Chief Complaint: No chief complaint on file.   HPI Andris Baumann presents for follow-up of GAD, MDD and insomnia.  Previously seen by Dr Toy Care.   Seeing therapist - Dr. Rica Mote   Describes mood today as "better". Pleasant. Denies tearfulness. Mood symptoms - reports some depression - more "situational". Denies anxiety and irritability. Mood is lower. Stating "I'm doing ok". Feels like medications are helpful. Varying interest and motivation. Taking medications as prescribed.  Energy levels lower. Active, does not have a regular exercise routine.  Enjoys some usual interests and activities. Single. Lives alone. Not dating. No family local. Spending time with friends. Attends church.  Appetite adequate. Weight stable. Sleeps well most nights. Averages 7 hours. Denies daytime napping. Difficulties arranging sleep study - having issues  with scheduling. Focus and concentration "it could be better". Completing some tasks. Managing aspects of household. Retired at age 33 from [employer]. Denies SI or HI.   Denies AH or VH. Denies self harm. Denies substance use.   Upcoming sleep study 05/16/2022  Previous ECT treatment.  Review of Systems:  Review of Systems  Musculoskeletal:  Negative for gait problem.  Neurological:  Negative for tremors.  Psychiatric/Behavioral:         Please refer to HPI    Medications: I have reviewed the patient's current medications.  Current Outpatient Medications  Medication Sig Dispense Refill   atorvastatin (LIPITOR) 40 MG tablet Take 1 tablet (40 mg total) by mouth at bedtime. 90 tablet 1   FLUoxetine (PROZAC) 10 MG capsule TAKE ONE CAPSULE BY MOUTH DAILY 30 capsule 2   Glycerin-Hypromellose-PEG 400 (VISINE DRY EYE OP) Place 1 drop into both eyes daily as needed (dry eyes).     hydrochlorothiazide (MICROZIDE) 12.5 MG capsule TAKE ONE CAPSULE BY MOUTH ONCE DAILY 90 capsule 0   losartan (COZAAR) 25 MG tablet Take 1 tablet (25 mg total) by mouth daily. 90 tablet 1   pantoprazole (PROTONIX) 40 MG tablet Take 1 tablet (40 mg total) by mouth daily. 90 tablet 1   Probiotic Product (PROBIOTIC DAILY PO) Take 1 capsule by mouth daily.     QUEtiapine (SEROQUEL) 100 MG tablet Take one to three tablets at bedtime. 90 tablet 2   traZODone (DESYREL) 50 MG tablet Take 2-3 tablets (100-150 mg total) by mouth at bedtime. 90 tablet 2   No current facility-administered medications for this visit.    Medication Side  Effects: None  Allergies:  Allergies  Allergen Reactions   Nexium [Esomeprazole] Diarrhea    Past Medical History:  Diagnosis Date   Depression    GERD (gastroesophageal reflux disease)    Hyperlipidemia    Hypertension     Family History  Problem Relation Age of Onset   Depression Mother    Cancer Father    Depression Brother     Social History   Socioeconomic History    Marital status: Single    Spouse name: Not on file   Number of children: 0   Years of education: Not on file   Highest education level: Doctorate  Occupational History   Occupation: Retired [place] [employer] Employee  Tobacco Use   Smoking status: Former   Smokeless tobacco: Never  Scientific laboratory technician Use: Never used  Substance and Sexual Activity   Alcohol use: Not Currently   Drug use: Never   Sexual activity: Not on file  Other Topics Concern   Not on file  Social History Narrative   Not on file   Social Determinants of Health   Financial Resource Strain: Low Risk  (02/26/2021)   Overall Financial Resource Strain (CARDIA)    Difficulty of Paying Living Expenses: Not hard at all  Food Insecurity: No Food Insecurity (02/26/2021)   Hunger Vital Sign    Worried About Running Out of Food in the Last Year: Never true    Ran Out of Food in the Last Year: Never true  Transportation Needs: No Transportation Needs (02/26/2021)   PRAPARE - Hydrologist (Medical): No    Lack of Transportation (Non-Medical): No  Physical Activity: Sufficiently Active (02/26/2021)   Exercise Vital Sign    Days of Exercise per Week: 7 days    Minutes of Exercise per Session: 30 min  Stress: No Stress Concern Present (02/26/2021)   [place]    Feeling of Stress : Not at all  Social Connections: Unknown (02/26/2021)   Social Connection and Isolation Panel [NHANES]    Frequency of Communication with Friends and Family: More than three times a week    Frequency of Social Gatherings with Friends and Family: More than three times a week    Attends Religious Services: More than 4 times per year    Active Member of [employer] or Organizations: Yes    Attends Music therapist: More than 4 times per year    Marital Status: Patient refused  Intimate Partner Violence: Not on file    Past Medical History, Surgical  history, Social history, and Family history were reviewed and updated as appropriate.   Please see review of systems for further details on the patient's review from today.   Objective:   Physical Exam:  There were no vitals taken for this visit.  Physical Exam  Lab Review:     Component Value Date/Time   NA 140 08/13/2021 0905   K 4.6 08/13/2021 0905   CL 104 08/13/2021 0905   CO2 30 08/13/2021 0905   GLUCOSE 100 (H) 08/13/2021 0905   BUN 22 08/13/2021 0905   BUN 15 07/06/2019 0000   CREATININE 0.85 08/13/2021 0905   CALCIUM 9.3 08/13/2021 0905   PROT 6.8 08/13/2021 0905   ALBUMIN 4.0 08/13/2021 0905   AST 19 08/13/2021 0905   ALT 14 08/13/2021 0905   ALKPHOS 48 08/13/2021 0905   BILITOT 0.4 08/13/2021 0905   GFRNONAA >  60 06/04/2021 1154       Component Value Date/Time   WBC 5.8 08/13/2021 0905   RBC 4.22 08/13/2021 0905   HGB 13.7 08/13/2021 0905   HCT 40.7 08/13/2021 0905   PLT 303.0 08/13/2021 0905   MCV 96.5 08/13/2021 0905   MCH 31.9 06/04/2021 1154   MCHC 33.7 08/13/2021 0905   RDW 13.8 08/13/2021 0905   LYMPHSABS 0.7 08/13/2021 0905   MONOABS 0.3 08/13/2021 0905   EOSABS 0.2 08/13/2021 0905   BASOSABS 0.1 08/13/2021 0905    No results found for: "POCLITH", "LITHIUM"   No results found for: "PHENYTOIN", "PHENOBARB", "VALPROATE", "CBMZ"   .res Assessment: Plan:    Plan:  Seeing Luan Moore for therapy   PDMP reviewed  Continue:  Prozac 97m daily  Seroquel 1041m- 1 tablet bedtime for sleep Trazadone 5029m taking one tablet at bedtime for sleep Hydroxyzine 38m3m1 at hs for sleep Melatonin 5mg 37mhs  Followed by Andy Luan Mooreerapy    Sleep study scheduled for January 9th.  RTC 2/4 weeks  Patient advised to contact office with any questions, adverse effects, or acute worsening in signs and symptoms.  Time spent with patient was 25 minutes. Greater than 50% of face to face time with patient was spent on counseling and  coordination of care.    Discussed potential metabolic side effects associated with atypical antipsychotics, as well as potential risk for movement side effects. Advised pt to contact office if movement side effects occur.   There are no diagnoses linked to this encounter.   Please see After Visit Summary for patient specific instructions.  Future Appointments  Date Time Provider DeparDana0/2024  2:00 PM MitchBlanchie Serve CP-CP None  06/03/2022  2:00 PM MitchBlanchie Serve CP-CP None  06/24/2022 11:00 AM MitchBlanchie Serve CP-CP None  07/15/2022 11:00 AM MitchBlanchie Serve CP-CP None  08/05/2022  2:00 PM MitchBlanchie Serve CP-CP None  08/26/2022  2:00 PM MitchBlanchie Serve CP-CP None    No orders of the defined types were placed in this encounter.     -------------------------------

## 2022-05-13 ENCOUNTER — Ambulatory Visit (INDEPENDENT_AMBULATORY_CARE_PROVIDER_SITE_OTHER): Payer: Medicare PPO | Admitting: Psychiatry

## 2022-05-13 DIAGNOSIS — G4700 Insomnia, unspecified: Secondary | ICD-10-CM

## 2022-05-13 DIAGNOSIS — R69 Illness, unspecified: Secondary | ICD-10-CM | POA: Diagnosis not present

## 2022-05-13 DIAGNOSIS — F3341 Major depressive disorder, recurrent, in partial remission: Secondary | ICD-10-CM | POA: Diagnosis not present

## 2022-05-13 DIAGNOSIS — Z9189 Other specified personal risk factors, not elsewhere classified: Secondary | ICD-10-CM | POA: Diagnosis not present

## 2022-05-13 DIAGNOSIS — F411 Generalized anxiety disorder: Secondary | ICD-10-CM | POA: Diagnosis not present

## 2022-05-13 NOTE — Progress Notes (Signed)
Psychotherapy Progress Note [facility] Luan Moore, PhD LP  Patient ID: Jamie Gamble [facility])    MRN: [id number] Therapy format: Individual psychotherapy Date: 05/13/2022      Start: 2:06p     Stop: 2:55p     Time Spent: 49 min Location: Telehealth visit -- I connected with this patient by an approved telecommunication method (video), with her informed consent, and verifying identity and patient privacy.  I was located at my office and patient at her home.  As needed, we discussed the limitations, risks, and security and privacy concerns associated with telehealth service, including the availability and conditions which currently govern in-person appointments and the possibility that 3rd-party payment may not be fully guaranteed and she may be responsible for charges.  After she indicated understanding, we proceeded with the session.  Also discussed treatment planning, as needed, including ongoing verbal agreement with the plan, the opportunity to ask and answer all questions, her demonstrated understanding of instructions, and her readiness to call the office should symptoms worsen or she feels she is in a crisis state and needs more immediate and tangible assistance.   Session narrative (presenting needs, interim history, self-report of stressors and symptoms, applications of prior therapy, status changes, and interventions made in session) Frustrated that clinic cancelled another sleep study -- was set for [employer] Bowl Sunday, apparently short-staffed.  Feels down now for having geared up in fear and anticipation, and bothered by inconsistent information about what her time frame is to find out if apnea or something else may be interfering so.  Also fears she won't be able to fall asleep at all under the circumstances.  At home, bothered lately by a noisy dog upstairs, and now by the impression a fellow resident is retaliating against her with loud music.  Able to suspend judgment,  having noticed loud music from another direction, too, and maybe chalking it up to congregate living.    Empathized with the experience of hurry up and wait with sleep study, and the weariness of it, and the simultaneous unwanted experiences of being the complainer, not being able to explain herself well, or potentially stigmatized as a mental case, or subject to lame advice and thin empathy.    Weary also of eating and gaining weight at home.  Aware there's no magic cure for these things, just discipline.  Recounts getting out for a couple things, albeit uncomfortably, and seeing the kindness of people.  Counted blessings about weight.  Has enjoyed [employer].  Acknowledges eating heavy later, and the compulsive feeling she can't sleep without food on her stomach.  Acknowledged the strain of this, educated about sleeping more deeply on a slightly empty stomach and challenged to trim back night eating, establish a food curfew and trust it.  As before, if she does have a difficult night, always option to use the wakeful time to rest, or read -- something without much activation from light, movement, or food -- and to trust she will catch up the next night as long as she doesn't nap long in the day.  Therapeutic modalities: Cognitive Behavioral Therapy, Solution-Oriented/Positive Psychology, and Ego-Supportive  Mental Status/Observations:  Appearance:   Casual     Behavior:  Appropriate  Motor:  Normal  Speech/Language:   Clear and Coherent  Affect:  Appropriate  Mood:  anxious and overall good-natured  Thought process:  normal  Thought content:    worry  Sensory/Perceptual disturbances:    WNL  Orientation:  Fully oriented  Attention:  Good    Concentration:  Fair  Memory:  WNL  Insight:    Good  Judgment:   Good  Impulse Control:  Good   Risk Assessment: Danger to Self: No Self-injurious Behavior: No Danger to Others: No Physical Aggression / Violence: No Duty to Warn:  No Access to Firearms a concern: No  Assessment of progress:  stabilized  Diagnosis:   ICD-10-CM   1. Generalized anxiety disorder  F41.1     2. Major depressive disorder, recurrent, in partial remission ([place])  F33.41     3. Insomnia, unspecified type  G47.00     4. r/o sleep apnea  R69     5. At increased risk for social isolation  Z91.89      Plan:  Sleep regulation -- Continue orange glasses QPM to facilitate circadian signalling, sleep readiness, sleep hardiness.  Confer with psychiatry, but probably worth adjust night meds dosing to just 1 hr before bed.  Option add melatonin 2-5mg  1-2 hr ahead of bed.  Practice sleep hygiene, with comfortable arrangements, lights reduced, and all caffeine stopped well ahead of bedtime (at least 6 hrs).  Use soothing imagery PRN for DFA.  For worries, option write down worry thoughts and consciously decide whether to stay up and think or lay them down and sleep.  Continue with second-opinion sleep assessment to confirm/refute suspected sleep apnea.  Acknowledge and table worry about whether she will sleep -- trust natura drive for sleep to come around. Physiological mood factors -- Start the day with good lighting, movement/stretching, and a few minutes grounding, outdoors if available/desired.  May need to check vitamin levels incl B12 and D, and anemia. Weight management -- OK with seeking medication, but make sure to stay mobile, watch portions, control carbs.  May observe practice of leaving a bite, work toward foods in the [place]. For self-esteem -- PRN practices of blessing-counting and listing ways people have told/shown her she is lovable/capable.  No requirement to believe or agree, just consider.  Seek to notice and give self credit when she does anything constructive or principled for things that matter to her wellbeing or sense of purpose. Intrusive guilt/shame thoughts -- As needed, self-affirm God would not be punishing her, it's a trick  of depression. Activities -- Develop activities of interest, for days with and without companions.  OK with working, provided it is sustainable. Financial concerns -- Follow through fact-finding and meeting with advisor, in coordination with friend/POA Webb Silversmith.  Still OK to seek part-time work she can reach from her home.  Maintain cordial working relationship with nephew/trustee. Support system/social involvement -- Maintain system of friends, [employer]master[employer], any available at housing community.  Re-engage available friends actively, and dispute worry thoughts of being judged.  Senior Resources available for supportive calls and possible facility-based recreation.  Home care companions if needed. Placement concerns -- Independent living with PRN assistance should be fine at this time.  Available on request to interpret her condition to family members and work out concerns. ECT -- On hold, is cognitively recovered, and the therapy is highly anxiety-provoking for her.  Pt understands and accepts the burden to notify treatment team promptly of relapse in dark/dreadful mood, severe demotivation, or intractable insomnia. Other recommendations/advice as may be noted above Other recommendations/advice as may be noted above Continue to utilize previously learned skills ad lib Maintain medication as prescribed and work faithfully with relevant prescriber(s) if any changes are desired or seem indicated Call  the clinic on-call service, 988/hotline, 911, or present to [facility] or ER if any life-threatening psychiatric crisis Return in about 3 weeks (around 06/03/2022) for as already scheduled, recommend sched ahead. Already scheduled visit in this office 06/03/2022.  Blanchie Serve, PhD Luan Moore, PhD LP Clinical Psychologist, [facility] [address] [place], [place] [address] [phone]

## 2022-05-14 ENCOUNTER — Other Ambulatory Visit: Payer: Self-pay | Admitting: Internal Medicine

## 2022-05-22 DIAGNOSIS — F419 Anxiety disorder, unspecified: Secondary | ICD-10-CM | POA: Diagnosis not present

## 2022-05-22 DIAGNOSIS — I10 Essential (primary) hypertension: Secondary | ICD-10-CM | POA: Diagnosis not present

## 2022-05-22 DIAGNOSIS — G4733 Obstructive sleep apnea (adult) (pediatric): Secondary | ICD-10-CM | POA: Diagnosis not present

## 2022-05-26 ENCOUNTER — Other Ambulatory Visit: Payer: Self-pay | Admitting: Adult Health

## 2022-05-26 ENCOUNTER — Other Ambulatory Visit: Payer: Self-pay | Admitting: Internal Medicine

## 2022-05-26 DIAGNOSIS — G4700 Insomnia, unspecified: Secondary | ICD-10-CM

## 2022-05-27 ENCOUNTER — Telehealth: Payer: Self-pay | Admitting: Adult Health

## 2022-05-27 ENCOUNTER — Other Ambulatory Visit: Payer: Self-pay | Admitting: Adult Health

## 2022-05-27 DIAGNOSIS — G4700 Insomnia, unspecified: Secondary | ICD-10-CM

## 2022-05-27 MED ORDER — HYDROXYZINE HCL 25 MG PO TABS: 25.0000 mg | ORAL_TABLET | Freq: Every evening | ORAL | 0 refills | Status: DC | PRN

## 2022-05-27 NOTE — Telephone Encounter (Signed)
Pt  called and said that she needs a refill on her hydroxine 25 mg. The pharmacy is [facility]. The last fill she had was in January.  The pharmacy told her we denied the script. She is out and needs it today.

## 2022-05-27 NOTE — Telephone Encounter (Signed)
It was for sleep. If she needs it she can have it.

## 2022-05-27 NOTE — Telephone Encounter (Signed)
Sent!

## 2022-05-27 NOTE — Telephone Encounter (Signed)
From 12/7 note:  D/C Hydroxyzine '25mg'$  - 1 at hs for sleep   I denied a RF and she is asking why as she needs it.  Please advise.

## 2022-05-30 DIAGNOSIS — G4733 Obstructive sleep apnea (adult) (pediatric): Secondary | ICD-10-CM | POA: Diagnosis not present

## 2022-06-02 DIAGNOSIS — G4733 Obstructive sleep apnea (adult) (pediatric): Secondary | ICD-10-CM | POA: Diagnosis not present

## 2022-06-03 ENCOUNTER — Ambulatory Visit (INDEPENDENT_AMBULATORY_CARE_PROVIDER_SITE_OTHER): Payer: Medicare PPO | Admitting: Psychiatry

## 2022-06-03 DIAGNOSIS — F411 Generalized anxiety disorder: Secondary | ICD-10-CM

## 2022-06-03 DIAGNOSIS — F3341 Major depressive disorder, recurrent, in partial remission: Secondary | ICD-10-CM | POA: Diagnosis not present

## 2022-06-03 DIAGNOSIS — G4700 Insomnia, unspecified: Secondary | ICD-10-CM | POA: Diagnosis not present

## 2022-06-03 DIAGNOSIS — G4733 Obstructive sleep apnea (adult) (pediatric): Secondary | ICD-10-CM | POA: Diagnosis not present

## 2022-06-03 DIAGNOSIS — Z9189 Other specified personal risk factors, not elsewhere classified: Secondary | ICD-10-CM

## 2022-06-03 NOTE — Progress Notes (Signed)
Psychotherapy Progress Note [facility] Luan Moore, PhD LP  Patient ID: Jamie Gamble [facility])    MRN: [id number] Therapy format: Individual psychotherapy Date: 06/03/2022      Start: 2:13p     Stop: 3:00p     Time Spent: 47 min Location: Telehealth visit -- I connected with this patient by an approved telecommunication method (video), with her informed consent, and verifying identity and patient privacy.  I was located at my office and patient at her home.  As needed, we discussed the limitations, risks, and security and privacy concerns associated with telehealth service, including the availability and conditions which currently govern in-person appointments and the possibility that 3rd-party payment may not be fully guaranteed and she may be responsible for charges.  After she indicated understanding, we proceeded with the session.  Also discussed treatment planning, as needed, including ongoing verbal agreement with the plan, the opportunity to ask and answer all questions, her demonstrated understanding of instructions, and her readiness to call the office should symptoms worsen or she feels she is in a crisis state and needs more immediate and tangible assistance.   Session narrative (presenting needs, interim history, self-report of stressors and symptoms, applications of prior therapy, status changes, and interventions made in session) Got her sleep study, was diagnosed with apnea, and has CPAP machine now.  Reports say it is sealing well, and using ir reliably.  Affirmed and encouraged.  Cataract work turns out to be able to wait till June.  OK with not doing it just yet, leery of procedure.  Briefed on what to expect and supported in positive expectations of lens replacement and recovery.  Foresees becoming more detached from her church group, [employer].  Just seems too antiseptic when it comes to matters of spiritual interest, and maybe too aloof for her.  Allowed it's her  prerogative, briefly explored whether other church affiliations would help.  No plans at this stage to seek work.  Has not looked into the Tuesday group at nearby church.  Rather reluctant to put herself out there for new relationships.  Been somewhat ill at ease about noisy neighbors and some apprehension she might be targeted for some socia retribution if she complains, but assured it's OK to go directly, go to property mgr if no satisfaction, or go straight to property.  Satisfied with medication, no present need of ECT.  Therapeutic modalities: Cognitive Behavioral Therapy, Solution-Oriented/Positive Psychology, Customer service manager, and Faith-sensitive  Mental Status/Observations:  Appearance:   Casual     Behavior:  Appropriate  Motor:  Normal  Speech/Language:   Clear and Coherent  Affect:  Appropriate  Mood:  anxious  Thought process:  normal  Thought content:    WNL  Sensory/Perceptual disturbances:    WNL  Orientation:  Fully oriented  Attention:  Good    Concentration:  Good  Memory:  WNL  Insight:    Good  Judgment:   Good  Impulse Control:  Good   Risk Assessment: Danger to Self: No Self-injurious Behavior: No Danger to Others: No Physical Aggression / Violence: No Duty to Warn: No Access to Firearms a concern: No  Assessment of progress:  progressing  Diagnosis:   ICD-10-CM   1. Generalized anxiety disorder  F41.1     2. Major depressive disorder, recurrent, in partial remission ([place])  F33.41     3. OSA on CPAP  G47.33     4. At increased risk for social isolation  Z91.89  Plan:  Sleep regulation -- Continue orange glasses QPM to facilitate circadian signaling, sleep readiness, sleep hardiness.  Confer with psychiatry, but probably worth adjust night meds dosing to just 1 hr before bed.  Option add melatonin 2-5mg  1-2 hr ahead of bed.  Practice sleep hygiene, with comfortable arrangements, lights reduced, and all caffeine stopped well ahead of bedtime (at  least 6 hrs).  Use soothing imagery PRN for DFA.  For worries, option write down worry thoughts and consciously decide whether to stay up and think or lay them down and sleep.  Adhere to CPAP.  Acknowledge and table worry about whether she will sleep -- trust natura drive for sleep to come around. Physiological mood factors -- Start the day with good lighting, movement/stretching, and a few minutes grounding, outdoors if available/desired.  May need to check vitamin levels incl B12 and D, and anemia. Weight management -- OK with seeking medication, but make sure to stay mobile, watch portions, control carbs.  May observe practice of leaving a bite, work up concentration of foods contained in the [place]. For self-esteem -- PRN practices of blessing-counting and listing ways people have told/shown her she is lovable/capable.  No requirement to believe or agree, just consider.  Seek to notice and give self credit when she does anything constructive or principled for things that matter to her wellbeing or sense of purpose. Intrusive guilt/shame thoughts -- As needed, self-affirm God would not be punishing her, it's a trick of depression. Activities -- Develop activities of interest, for days with and without companions.  OK with working, provided it is sustainable. Financial concerns -- Follow through fact-finding and meeting with advisor, in coordination with friend/POA Webb Silversmith.  Still OK to seek part-time work she can reach from her home.  Maintain cordial working relationship with nephew/trustee. Support system/social involvement -- Maintain system of friends, [employer], any available at housing community.  Re-engage available friends actively, and dispute worry thoughts of being judged.  Senior Resources available for supportive calls and possible facility-based recreation.  Home care companions if needed. Placement concerns -- Independent living with PRN assistance should be fine at this time.   Available on request to interpret her condition to family members and work out concerns. ECT -- On hold, is cognitively recovered, and the therapy is highly anxiety-provoking for her.  Pt understands and accepts the burden to notify treatment team promptly of relapse in dark/dreadful mood, severe demotivation, or intractable insomnia. Other recommendations/advice as may be noted above Continue to utilize previously learned skills ad lib Maintain medication as prescribed and work faithfully with relevant prescriber(s) if any changes are desired or seem indicated Call the clinic on-call service, 988/hotline, 911, or present to [facility] or ER if any life-threatening psychiatric crisis No follow-ups on file. Already scheduled visit in this office 06/05/2022.  Blanchie Serve, PhD Luan Moore, PhD LP Clinical Psychologist, [facility] [address] [place], [place] [address] [phone]

## 2022-06-05 ENCOUNTER — Encounter: Payer: Self-pay | Admitting: Adult Health

## 2022-06-05 ENCOUNTER — Telehealth (INDEPENDENT_AMBULATORY_CARE_PROVIDER_SITE_OTHER): Payer: Medicare PPO | Admitting: Adult Health

## 2022-06-05 DIAGNOSIS — F411 Generalized anxiety disorder: Secondary | ICD-10-CM

## 2022-06-05 DIAGNOSIS — F3341 Major depressive disorder, recurrent, in partial remission: Secondary | ICD-10-CM

## 2022-06-05 DIAGNOSIS — G4700 Insomnia, unspecified: Secondary | ICD-10-CM | POA: Diagnosis not present

## 2022-06-05 NOTE — Progress Notes (Signed)
Jamie Gamble [id number] 08-24-1945 77 y.o.  Virtual Visit via Video Note  I connected with pt @ on 06/05/22 at  3:00 PM EDT by a video enabled telemedicine application and verified that I am speaking with the correct person using two identifiers.   I discussed the limitations of evaluation and management by telemedicine and the availability of in person appointments. The patient expressed understanding and agreed to proceed.  I discussed the assessment and treatment plan with the patient. The patient was provided an opportunity to ask questions and all were answered. The patient agreed with the plan and demonstrated an understanding of the instructions.   The patient was advised to call back or seek an in-person evaluation if the symptoms worsen or if the condition fails to improve as anticipated.  I provided 21 minutes of non-face-to-face time during this encounter.  The patient was located at home.  The provider was located at [place].   Aloha Gell, NP   Subjective:   Patient ID:  Jamie Gamble is a 77 y.o. (DOB Feb 19, 1946) female.  Chief Complaint: No chief complaint on file.   HPI Jamie Gamble presents for follow-up of GAD, MDD and insomnia.  Previously seen by Dr Toy Care.   Seeing therapist - Dr. Rica Gamble   Describes mood today as "ok". Pleasant. Denies tearfulness. Mood symptoms - reports some depression - "feeling a little more hopeful". Denies anxiety and irritability. Mood is lower. Stating "I'm doing alright". Feels like medications are helpful. Varying interest and motivation. Taking medications as prescribed.  Energy levels lower. Active, does not have a regular exercise routine.  Enjoys some usual interests and activities. Single. Lives alone. Not dating. No family local. Spending time with friends. Attends church.  Appetite adequate. Weight gain 180 pounds. Sleeping better at night. Averages 7 hours. Denies daytime napping. Recent sleep study indicated  sleep apnea. Focus and concentration difficulties - "poor sleep over past 9 months". Completing some tasks. Managing aspects of household. Retired at age 31 from [employer]. Denies SI or HI.   Denies AH or VH. Denies self harm. Denies substance use.   Upcoming sleep study 05/16/2022  Previous ECT treatment.   Review of Systems:  Review of Systems  Musculoskeletal:  Negative for gait problem.  Neurological:  Negative for tremors.  Psychiatric/Behavioral:         Please refer to HPI    Medications: I have reviewed the patient's current medications.  Current Outpatient Medications  Medication Sig Dispense Refill   atorvastatin (LIPITOR) 40 MG tablet Take 1 tablet (40 mg total) by mouth at bedtime. 90 tablet 0   FLUoxetine (PROZAC) 10 MG capsule TAKE ONE CAPSULE BY MOUTH DAILY 30 capsule 2   Glycerin-Hypromellose-PEG 400 (VISINE DRY EYE OP) Place 1 drop into both eyes daily as needed (dry eyes).     hydrochlorothiazide (MICROZIDE) 12.5 MG capsule TAKE ONE CAPSULE BY MOUTH ONCE DAILY 90 capsule 0   hydrOXYzine (ATARAX) 25 MG tablet Take 1 tablet (25 mg total) by mouth at bedtime as needed. 90 tablet 0   losartan (COZAAR) 25 MG tablet Take 1 tablet (25 mg total) by mouth daily. 90 tablet 1   pantoprazole (PROTONIX) 40 MG tablet Take 1 tablet (40 mg total) by mouth daily. 90 tablet 1   Probiotic Product (PROBIOTIC DAILY PO) Take 1 capsule by mouth daily.     QUEtiapine (SEROQUEL) 100 MG tablet Take one to three tablets at bedtime. 90 tablet 2   traZODone (DESYREL)  50 MG tablet Take 2-3 tablets (100-150 mg total) by mouth at bedtime. 90 tablet 2   No current facility-administered medications for this visit.    Medication Side Effects: None  Allergies:  Allergies  Allergen Reactions   Nexium [Esomeprazole] Diarrhea    Past Medical History:  Diagnosis Date   Depression    GERD (gastroesophageal reflux disease)    Hyperlipidemia    Hypertension     Family History  Problem Relation  Age of Onset   Depression Mother    Cancer Father    Depression Brother     Social History   Socioeconomic History   Marital status: Single    Spouse name: Not on file   Number of children: 0   Years of education: Not on file   Highest education level: Doctorate  Occupational History   Occupation: Retired [place] [employer] Employee  Tobacco Use   Smoking status: Former   Smokeless tobacco: Never  Scientific laboratory technician Use: Never used  Substance and Sexual Activity   Alcohol use: Not Currently   Drug use: Never   Sexual activity: Not on file  Other Topics Concern   Not on file  Social History Narrative   Not on file   Social Determinants of Health   Financial Resource Strain: Low Risk  (02/26/2021)   Overall Financial Resource Strain (CARDIA)    Difficulty of Paying Living Expenses: Not hard at all  Food Insecurity: No Food Insecurity (02/26/2021)   Hunger Vital Sign    Worried About Running Out of Food in the Last Year: Never true    Ran Out of Food in the Last Year: Never true  Transportation Needs: No Transportation Needs (02/26/2021)   PRAPARE - Hydrologist (Medical): No    Lack of Transportation (Non-Medical): No  Physical Activity: Sufficiently Active (02/26/2021)   Exercise Vital Sign    Days of Exercise per Week: 7 days    Minutes of Exercise per Session: 30 min  Stress: No Stress Concern Present (02/26/2021)   [place]    Feeling of Stress : Not at all  Social Connections: Unknown (02/26/2021)   Social Connection and Isolation Panel [NHANES]    Frequency of Communication with Friends and Family: More than three times a week    Frequency of Social Gatherings with Friends and Family: More than three times a week    Attends Religious Services: More than 4 times per year    Active Member of [employer] or Organizations: Yes    Attends Music therapist: More than 4 times per  year    Marital Status: Patient declined  Human resources officer Violence: Not on file    Past Medical History, Surgical history, Social history, and Family history were reviewed and updated as appropriate.   Please see review of systems for further details on the patient's review from today.   Objective:   Physical Exam:  There were no vitals taken for this visit.  Physical Exam Constitutional:      General: She is not in acute distress. Musculoskeletal:        General: No deformity.  Neurological:     Mental Status: She is alert and oriented to person, place, and time.     Coordination: Coordination normal.  Psychiatric:        Attention and Perception: Attention and perception normal. She does not perceive auditory or visual hallucinations.  Mood and Affect: Mood normal. Mood is not anxious or depressed. Affect is not labile, blunt, angry or inappropriate.        Speech: Speech normal.        Behavior: Behavior normal.        Thought Content: Thought content normal. Thought content is not paranoid or delusional. Thought content does not include homicidal or suicidal ideation. Thought content does not include homicidal or suicidal plan.        Cognition and Memory: Cognition and memory normal.        Judgment: Judgment normal.     Comments: Insight intact     Lab Review:     Component Value Date/Time   NA 140 08/13/2021 0905   K 4.6 08/13/2021 0905   CL 104 08/13/2021 0905   CO2 30 08/13/2021 0905   GLUCOSE 100 (H) 08/13/2021 0905   BUN 22 08/13/2021 0905   BUN 15 07/06/2019 0000   CREATININE 0.85 08/13/2021 0905   CALCIUM 9.3 08/13/2021 0905   PROT 6.8 08/13/2021 0905   ALBUMIN 4.0 08/13/2021 0905   AST 19 08/13/2021 0905   ALT 14 08/13/2021 0905   ALKPHOS 48 08/13/2021 0905   BILITOT 0.4 08/13/2021 0905   GFRNONAA >60 06/04/2021 1154       Component Value Date/Time   WBC 5.8 08/13/2021 0905   RBC 4.22 08/13/2021 0905   HGB 13.7 08/13/2021 0905   HCT  40.7 08/13/2021 0905   PLT 303.0 08/13/2021 0905   MCV 96.5 08/13/2021 0905   MCH 31.9 06/04/2021 1154   MCHC 33.7 08/13/2021 0905   RDW 13.8 08/13/2021 0905   LYMPHSABS 0.7 08/13/2021 0905   MONOABS 0.3 08/13/2021 0905   EOSABS 0.2 08/13/2021 0905   BASOSABS 0.1 08/13/2021 0905    No results found for: "POCLITH", "LITHIUM"   No results found for: "PHENYTOIN", "PHENOBARB", "VALPROATE", "CBMZ"   .res Assessment: Plan:    Plan:  Seeing Luan Moore for therapy   PDMP reviewed  Continue:  Prozac '10mg'$  daily  Seroquel '100mg'$  - 1 tablet bedtime for sleep Trazadone '50mg'$  - taking one tablet at bedtime for sleep Hydroxyzine '25mg'$  - 1 at hs for sleep Melatonin '5mg'$  at hs  Followed by Luan Moore - therapy    Sleep study scheduled for January 9th.  RTC 2/4 weeks  Patient advised to contact office with any questions, adverse effects, or acute worsening in signs and symptoms.  Time spent with patient was 25 minutes. Greater than 50% of face to face time with patient was spent on counseling and coordination of care.    Discussed potential metabolic side effects associated with atypical antipsychotics, as well as potential risk for movement side effects. Advised pt to contact office if movement side effects occur.   There are no diagnoses linked to this encounter.   Please see After Visit Summary for patient specific instructions.  Future Appointments  Date Time Provider [place]  06/24/2022 11:00 AM Blanchie Serve, PhD CP-CP None  07/15/2022 11:00 AM Blanchie Serve, PhD CP-CP None  08/05/2022  2:00 PM Blanchie Serve, PhD CP-CP None  08/26/2022  2:00 PM Blanchie Serve, PhD CP-CP None  09/16/2022 11:00 AM Blanchie Serve, PhD CP-CP None  10/07/2022 11:00 AM Blanchie Serve, PhD CP-CP None    No orders of the defined types were placed in this encounter.     -------------------------------

## 2022-06-24 ENCOUNTER — Ambulatory Visit (INDEPENDENT_AMBULATORY_CARE_PROVIDER_SITE_OTHER): Payer: Medicare PPO | Admitting: Psychiatry

## 2022-06-24 DIAGNOSIS — F411 Generalized anxiety disorder: Secondary | ICD-10-CM

## 2022-06-24 DIAGNOSIS — G4700 Insomnia, unspecified: Secondary | ICD-10-CM | POA: Diagnosis not present

## 2022-06-24 DIAGNOSIS — G4733 Obstructive sleep apnea (adult) (pediatric): Secondary | ICD-10-CM | POA: Diagnosis not present

## 2022-06-24 DIAGNOSIS — F3341 Major depressive disorder, recurrent, in partial remission: Secondary | ICD-10-CM

## 2022-06-24 DIAGNOSIS — Z9189 Other specified personal risk factors, not elsewhere classified: Secondary | ICD-10-CM

## 2022-06-24 NOTE — Progress Notes (Signed)
Psychotherapy Progress Note [facility] Jamie Moore, PhD LP  Patient ID: Jamie Gamble [facility])    MRN: [id number] Therapy format: Individual psychotherapy Date: 06/24/2022      Start: 11:21a     Stop: 12:06p     Time Spent: 45 min Location: In-person   Session narrative (presenting needs, interim history, self-report of stressors and symptoms, applications of prior therapy, status changes, and interventions made in session) CPAP working correctly, according to data, but she is feeling lower energy and motivation.  Ivor Costa is grief season, and spring allergy season may have an effect.  Also began cutting back Seroquel, by agreement with prescriber.  Only on 10mg  Prozac, so plenty of room to go up if needed to help with dread feelings, apprehension, sensitivities related to depression.  Advised check with prescriber, whom she sees in 2 wks.  More disaffected with her social circle at church [facility], the [employer] group).  Thinking back, feels she has never gotten over "that lady" abandoning her when she was in the hospital, nor hearing another woman remark how she doesn't like being around depressed people.  Agreed it is hard on her to hear insensitivity from others, even indirectly.  Figures she doesn't really fit in there, anyway, with "the liberal elites", who seem too aloof for her and insincere or 2-faced about supporting others.  Recites stories of feeling marginalized  when she differed with a prevailing political viewpoint.  Digging into it, she does miss the more authentic kindness and evenhandedness she has known in the [place], where she came up.  Even tiring of [employer], though she says the culture there is as well-managed to be inclusive ans respectful as any church she's known.  Acknowledges also that she is light- and sound-sensitive lately.  Encouraged to manage light levels best possible to make for a healthy arc to the day.  Orange glasses in evening still  helping get her melatonin on before sleep.  Discussed need for things worth getting up (or out) for, that possibly she feels more dread because she's better rested and alert for unfulfilling experience.  Still geared to just pull back some instead, "just for a little while".  Allowed her prerogative but challenged to consider whether it's also a trick of depression to get her disconnected again, and how she might recognize if it's time to go against her own grain and involved herself somewhere, somehow.  Meanwhile, advocated for changes of scenery, like her excursions outside, possibly better lighting her space during the day, and for getting some distance vision during the day, e.g., 1 minute 2-3 times an hour gazing out the window, citing the 20-20-20 rule and evidence that mere distance vision is calming/soothing for people who have to spend a lot of tie seeing things up close.    Socially, could consider returning to [facility], knows she has a man who would bring her, but leery of the spiritual workload of extra meetings.  [place] group seeming too inauthentic.  Hard to find other people who resonate.  Offered prerogative, again, and reminded of the possibilities that [facility] has different small groups that may suit her better, and [facility] may not actually require what she feels they do.  Says she's actually more coming to some doubt that church helps anything, and maybe quandaries about God and God's involvement in our lives.  Seems to have been taking on the philosophical problem of evil, including difficulty with another acquaintance's conclusion that  God authors all things, the good and the bad, and that's the problem she has with God.  Offered the possibility, a [place], that God limits God's own power in order to make room for people to have agency in the world and to be co-creators, but means to care and accompany throughout.  Allowed again, her prerogative, adding that not every church  group is going to want to debate heavy questions, either, if she'd rather not.  Confirmed she has not tried out the [employer] group on Tuesdays at nearby [employer].  Offered they also have Thursday chair yoga if she'd like some peaceful movement and stretching in a much smaller group.  Therapeutic modalities: Cognitive Behavioral Therapy, Solution-Oriented/Positive Psychology, and Ego-Supportive  Mental Status/Observations:  Appearance:   Casual     Behavior:  Appropriate  Motor:  Normal  Speech/Language:   Clear and Coherent  Affect:  Appropriate, responsive to humor  Mood:  dysthymic  Thought process:  normal  Thought content:    Some cynicism rowing, perhaps  Sensory/Perceptual disturbances:    WNL except stated sensitivity to light and sound  Orientation:  Fully oriented  Attention:  Good    Concentration:  Good  Memory:  WNL  Insight:    Good  Judgment:   Fair  Impulse Control:  Good   Risk Assessment: Danger to Self: No Self-injurious Behavior: No Danger to Others: No Physical Aggression / Violence: No Duty to Warn: No Access to Firearms a concern: No  Assessment of progress:  stabilized, possibility of relapsing depression vs. short-term adjustment  Diagnosis:   ICD-10-CM   1. Major depressive disorder, recurrent, in partial remission  F33.41     2. Generalized anxiety disorder  F41.1     3. OSA on CPAP  G47.33     4. Insomnia, unspecified type  G47.00     5. At increased risk for social isolation  Z91.89      Plan:  Sleep regulation -- Continue orange glasses QPM to facilitate circadian signaling, sleep readiness, sleep hardiness.  Confer with psychiatry, but probably worth adjust night meds dosing to just 1 hr before bed.  Option add melatonin 2-5mg  1-2 hr ahead of bed.  Practice sleep hygiene, with comfortable arrangements, lights reduced, and all caffeine stopped well ahead of bedtime (at least 6 hrs).  Use soothing imagery PRN for DFA.  For worries, option  write down worry thoughts and consciously decide whether to stay up and think or lay them down and sleep.  Adhere to CPAP.  Acknowledge and table worry about whether she will sleep -- trust natura drive for sleep to come around. Physiological mood factors -- Start the day with good lighting, movement/stretching, and a few minutes grounding, outdoors if available/desired.  May need to check vitamin levels incl B12 and D, and anemia. Weight management -- OK with seeking medication, but make sure to stay mobile, watch portions, control carbs.  May observe practice of leaving a bite, work up concentration of foods contained in the [place]. For self-esteem -- PRN practices of blessing-counting and listing ways people have told/shown her she is lovable/capable.  No requirement to believe or agree, just consider.  Seek to notice and give self credit when she does anything constructive or principled for things that matter to her wellbeing or sense of purpose. Intrusive guilt/shame thoughts -- As needed, self-affirm God would not be punishing her, it's a trick of depression. Activities -- Develop activities of interest, as interested.  OK with working, if regains interested.  Self-monitor for broad demotivation.  Options in church activities at 3 locations, and ranging from chair yoga to entertainment/fellowship group to more theological and philosophical study.   Financial concerns -- Follow through fact-finding and meeting with advisor, in coordination with friend/POA Webb Silversmith.  Still OK to seek part-time work she can reach from her home.  Maintain cordial working relationship with nephew/trustee. Support system/social involvement -- Maintain system of friends, [employer], any available at housing community.  Re-engage available friends actively, and dispute worry thoughts of being judged.  [employer] available for supportive calls and possible facility-based recreation.  Home care companions if needed.   Self-monitor for return to reclusiveness and depression, and in all involvements practice permission to be authentic with one and all. Placement concerns -- Independent living with PRN assistance should be fine at this time.  Available on request to interpret her condition to family members and work out concerns. ECT -- On hold, is cognitively recovered, and the therapy is highly anxiety-provoking for her.  Pt understands and accepts the burden to notify treatment team promptly of relapse in dark/dreadful mood, severe demotivation, or intractable insomnia. Other recommendations/advice as may be noted above Continue to utilize previously learned skills ad lib Maintain medication as prescribed and work faithfully with relevant prescriber(s) if any changes are desired or seem indicated Call the clinic on-call service, 988/hotline, 911, or present to [facility] or ER if any life-threatening psychiatric crisis No follow-ups on file. Already scheduled visit in this office 07/08/2022.  Blanchie Serve, PhD Jamie Moore, PhD LP Clinical Psychologist, [facility] [address], [place], [place] [address] [phone]

## 2022-06-30 DIAGNOSIS — G4733 Obstructive sleep apnea (adult) (pediatric): Secondary | ICD-10-CM | POA: Diagnosis not present

## 2022-07-02 ENCOUNTER — Other Ambulatory Visit: Payer: Self-pay | Admitting: Adult Health

## 2022-07-02 DIAGNOSIS — F331 Major depressive disorder, recurrent, moderate: Secondary | ICD-10-CM

## 2022-07-02 DIAGNOSIS — F411 Generalized anxiety disorder: Secondary | ICD-10-CM

## 2022-07-08 ENCOUNTER — Telehealth (INDEPENDENT_AMBULATORY_CARE_PROVIDER_SITE_OTHER): Payer: Medicare PPO | Admitting: Adult Health

## 2022-07-08 ENCOUNTER — Encounter: Payer: Self-pay | Admitting: Adult Health

## 2022-07-08 DIAGNOSIS — G4700 Insomnia, unspecified: Secondary | ICD-10-CM | POA: Diagnosis not present

## 2022-07-08 DIAGNOSIS — F331 Major depressive disorder, recurrent, moderate: Secondary | ICD-10-CM

## 2022-07-08 DIAGNOSIS — F3341 Major depressive disorder, recurrent, in partial remission: Secondary | ICD-10-CM

## 2022-07-08 DIAGNOSIS — F411 Generalized anxiety disorder: Secondary | ICD-10-CM | POA: Diagnosis not present

## 2022-07-08 MED ORDER — TRAZODONE HCL 100 MG PO TABS
100.0000 mg | ORAL_TABLET | Freq: Every day | ORAL | 1 refills | Status: DC
Start: 2022-07-08 — End: 2022-12-18

## 2022-07-08 MED ORDER — FLUOXETINE HCL 20 MG PO CAPS: 20.0000 mg | ORAL_CAPSULE | Freq: Every day | ORAL | 1 refills | Status: DC

## 2022-07-08 MED ORDER — QUETIAPINE FUMARATE 25 MG PO TABS: 25.0000 mg | ORAL_TABLET | Freq: Every day | ORAL | 0 refills | Status: DC

## 2022-07-08 NOTE — Progress Notes (Signed)
DUHA ABAIR [id number] [birth year] 77 y.o.  Virtual Visit via Video Note  I connected with pt @ on 07/08/22 at  1:20 PM EDT by a video enabled telemedicine application and verified that I am speaking with the correct person using two identifiers.   I discussed the limitations of evaluation and management by telemedicine and the availability of in person appointments. The patient expressed understanding and agreed to proceed.  I discussed the assessment and treatment plan with the patient. The patient was provided an opportunity to ask questions and all were answered. The patient agreed with the plan and demonstrated an understanding of the instructions.   The patient was advised to call back or seek an in-person evaluation if the symptoms worsen or if the condition fails to improve as anticipated.  I provided 15 minutes of non-face-to-face time during this encounter.  The patient was located at home.  The provider was located at [facility].   Dorothyann Gibbs, NP   Subjective:   Patient ID:  Jamie Gamble is a 77 y.o. (DOB 21-May-1945) female.  Chief Complaint: No chief complaint on file.   HPI Jamie Gamble presents for follow-up of GAD, MDD and insomnia.  Seeing therapist - Dr. Farrel Demark   Describes mood today as "ok". Pleasant. Denies tearfulness. Mood symptoms - reports depression - "not wanting to do things". Denies anxiety and irritability. Reports some worry and over thinking - "more of a habit". Mood is improving. Stating "I'm doing better overall". Feels like medications are helpful. Varying interest and motivation. Taking medications as prescribed.  Energy levels lower. Active, has a regular exercise routine - walking on the treadmill 25 minutes a day.  Enjoys some usual interests and activities. Single. Lives alone. Not dating. No family local. Spending time with friends. Attends church.  Appetite adequate. Weight stable - 180 pounds. Sleeping better at night.  Averages 7 hours. Denies daytime napping. Using CPAP nightly. Focus and concentration improved. Completing some tasks. Managing aspects of household. Retired at age 43 from [employer]. Denies SI or HI.   Denies AH or VH. Denies self harm. Denies substance use.   Previous ECT treatment.  Review of Systems:  Review of Systems  Musculoskeletal:  Negative for gait problem.  Neurological:  Negative for tremors.  Psychiatric/Behavioral:         Please refer to HPI    Medications: I have reviewed the patient's current medications.  Current Outpatient Medications  Medication Sig Dispense Refill   QUEtiapine (SEROQUEL) 25 MG tablet Take 1 tablet (25 mg total) by mouth at bedtime. 30 tablet 0   atorvastatin (LIPITOR) 40 MG tablet Take 1 tablet (40 mg total) by mouth at bedtime. 90 tablet 0   FLUoxetine (PROZAC) 20 MG capsule Take 1 capsule (20 mg total) by mouth daily. 90 capsule 1   Glycerin-Hypromellose-PEG 400 (VISINE DRY EYE OP) Place 1 drop into both eyes daily as needed (dry eyes).     hydrochlorothiazide (MICROZIDE) 12.5 MG capsule TAKE ONE CAPSULE BY MOUTH ONCE DAILY 90 capsule 0   hydrOXYzine (ATARAX) 25 MG tablet Take 1 tablet (25 mg total) by mouth at bedtime as needed. 90 tablet 0   losartan (COZAAR) 25 MG tablet Take 1 tablet (25 mg total) by mouth daily. 90 tablet 1   pantoprazole (PROTONIX) 40 MG tablet Take 1 tablet (40 mg total) by mouth daily. 90 tablet 1   Probiotic Product (PROBIOTIC DAILY PO) Take 1 capsule by mouth daily.  traZODone (DESYREL) 100 MG tablet Take 1 tablet (100 mg total) by mouth at bedtime. 90 tablet 1   No current facility-administered medications for this visit.    Medication Side Effects: None  Allergies:  Allergies  Allergen Reactions   Nexium [Esomeprazole] Diarrhea    Past Medical History:  Diagnosis Date   Depression    GERD (gastroesophageal reflux disease)    Hyperlipidemia    Hypertension     Family History  Problem Relation Age of  Onset   Depression Mother    Cancer Father    Depression Brother     Social History   Socioeconomic History   Marital status: Single    Spouse name: Not on file   Number of children: 0   Years of education: Not on file   Highest education level: Doctorate  Occupational History   Occupation: Retired [place] Employee  Tobacco Use   Smoking status: Former   Smokeless tobacco: Never  Building services engineer Use: Never used  Substance and Sexual Activity   Alcohol use: Not Currently   Drug use: Never   Sexual activity: Not on file  Other Topics Concern   Not on file  Social History Narrative   Not on file   Social Determinants of Health   Financial Resource Strain: Low Risk  (02/26/2021)   Overall Financial Resource Strain (CARDIA)    Difficulty of Paying Living Expenses: Not hard at all  Food Insecurity: No Food Insecurity (02/26/2021)   Hunger Vital Sign    Worried About Running Out of Food in the Last Year: Never true    Ran Out of Food in the Last Year: Never true  Transportation Needs: No Transportation Needs (02/26/2021)   PRAPARE - Administrator, Civil Service (Medical): No    Lack of Transportation (Non-Medical): No  Physical Activity: Sufficiently Active (02/26/2021)   Exercise Vital Sign    Days of Exercise per Week: 7 days    Minutes of Exercise per Session: 30 min  Stress: No Stress Concern Present (02/26/2021)   [employer] of Occupational Health - Occupational Stress Questionnaire    Feeling of Stress : Not at all  Social Connections: Unknown (02/26/2021)   Social Connection and Isolation Panel [NHANES]    Frequency of Communication with Friends and Family: More than three times a week    Frequency of Social Gatherings with Friends and Family: More than three times a week    Attends Religious Services: More than 4 times per year    Active Member of [employer] or Organizations: Yes    Attends Engineer, structural: More than 4 times per year     Marital Status: Patient declined  Catering manager Violence: Not on file    Past Medical History, Surgical history, Social history, and Family history were reviewed and updated as appropriate.   Please see review of systems for further details on the patient's review from today.   Objective:   Physical Exam:  There were no vitals taken for this visit.  Physical Exam Constitutional:      General: She is not in acute distress. Musculoskeletal:        General: No deformity.  Neurological:     Mental Status: She is alert and oriented to person, place, and time.     Coordination: Coordination normal.  Psychiatric:        Attention and Perception: Attention and perception normal. She does not perceive auditory or visual  hallucinations.        Mood and Affect: Mood normal. Mood is not anxious or depressed. Affect is not labile, blunt, angry or inappropriate.        Speech: Speech normal.        Behavior: Behavior normal.        Thought Content: Thought content normal. Thought content is not paranoid or delusional. Thought content does not include homicidal or suicidal ideation. Thought content does not include homicidal or suicidal plan.        Cognition and Memory: Cognition and memory normal.        Judgment: Judgment normal.     Comments: Insight intact     Lab Review:     Component Value Date/Time   NA 140 08/13/2021 0905   K 4.6 08/13/2021 0905   CL 104 08/13/2021 0905   CO2 30 08/13/2021 0905   GLUCOSE 100 (H) 08/13/2021 0905   BUN 22 08/13/2021 0905   BUN 15 07/06/2019 0000   CREATININE 0.85 08/13/2021 0905   CALCIUM 9.3 08/13/2021 0905   PROT 6.8 08/13/2021 0905   ALBUMIN 4.0 08/13/2021 0905   AST 19 08/13/2021 0905   ALT 14 08/13/2021 0905   ALKPHOS 48 08/13/2021 0905   BILITOT 0.4 08/13/2021 0905   GFRNONAA >60 06/04/2021 1154       Component Value Date/Time   WBC 5.8 08/13/2021 0905   RBC 4.22 08/13/2021 0905   HGB 13.7 08/13/2021 0905   HCT 40.7  08/13/2021 0905   PLT 303.0 08/13/2021 0905   MCV 96.5 08/13/2021 0905   MCH 31.9 06/04/2021 1154   MCHC 33.7 08/13/2021 0905   RDW 13.8 08/13/2021 0905   LYMPHSABS 0.7 08/13/2021 0905   MONOABS 0.3 08/13/2021 0905   EOSABS 0.2 08/13/2021 0905   BASOSABS 0.1 08/13/2021 0905    No results found for: "POCLITH", "LITHIUM"   No results found for: "PHENYTOIN", "PHENOBARB", "VALPROATE", "CBMZ"   .res Assessment: Plan:    Plan:  Seeing [name] for therapy   PDMP reviewed  Continue:  Increase Prozac 10 to  daily  Reduce the Seroquel  to  at bedtime x 7 days, then d/c.   Trazadone  - take one tablet at bedtime for sleep  Hydroxyzine  - 1 at hs for sleep  Melatonin  at hs  Discussed NAC tablets and Magnesium L-threonate.  Followed by [name] - therapy    Sleep study scheduled for January 9th.  RTC 4 weeks  Patient advised to contact office with any questions, adverse effects, or acute worsening in signs and symptoms.  Time spent with patient was 25 minutes. Greater than 50% of face to face time with patient was spent on counseling and coordination of care.    Discussed potential metabolic side effects associated with atypical antipsychotics, as well as potential risk for movement side effects. Advised pt to contact office if movement side effects occur.   Diagnoses and all orders for this visit:  Insomnia, unspecified type -     QUEtiapine (SEROQUEL) 25 MG tablet; Take 1 tablet (25 mg total) by mouth at bedtime. -     traZODone (DESYREL) 100 MG tablet; Take 1 tablet (100 mg total) by mouth at bedtime.  Major depressive disorder, recurrent, in partial remission  Generalized anxiety disorder -     FLUoxetine (PROZAC) 20 MG capsule; Take 1 capsule (20 mg total) by mouth daily.  Major depressive disorder, recurrent episode, moderate -     FLUoxetine (PROZAC)  20 MG capsule; Take 1 capsule (20 mg total) by mouth daily.     Please  see After Visit Summary for patient specific instructions.  Future Appointments  Date Time Provider Department Center  07/15/2022 11:00 AM Robley Fries, PhD CP-[facility] None  08/05/2022  2:00 PM Robley Fries, PhD CP-[facility] None  08/26/2022  2:00 PM Robley Fries, PhD CP-CP None  09/16/2022 11:00 AM Robley Fries, PhD CP-[facility] None  10/07/2022 11:00 AM Robley Fries, PhD CP-[facility] None  10/28/2022 11:00 AM Robley Fries, PhD CP-CP None    No orders of the defined types were placed in this encounter.     -------------------------------

## 2022-07-15 ENCOUNTER — Ambulatory Visit (INDEPENDENT_AMBULATORY_CARE_PROVIDER_SITE_OTHER): Payer: Medicare PPO | Admitting: Psychiatry

## 2022-07-15 DIAGNOSIS — F411 Generalized anxiety disorder: Secondary | ICD-10-CM

## 2022-07-15 DIAGNOSIS — G4733 Obstructive sleep apnea (adult) (pediatric): Secondary | ICD-10-CM | POA: Diagnosis not present

## 2022-07-15 DIAGNOSIS — G4700 Insomnia, unspecified: Secondary | ICD-10-CM | POA: Diagnosis not present

## 2022-07-15 DIAGNOSIS — Z741 Need for assistance with personal care: Secondary | ICD-10-CM | POA: Diagnosis not present

## 2022-07-15 DIAGNOSIS — F3341 Major depressive disorder, recurrent, in partial remission: Secondary | ICD-10-CM | POA: Diagnosis not present
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
## 2022-07-30 DIAGNOSIS — G4733 Obstructive sleep apnea (adult) (pediatric): Secondary | ICD-10-CM | POA: Diagnosis not present

## 2022-08-04 ENCOUNTER — Telehealth (INDEPENDENT_AMBULATORY_CARE_PROVIDER_SITE_OTHER): Payer: Medicare PPO | Admitting: Adult Health

## 2022-08-04 ENCOUNTER — Encounter: Payer: Self-pay | Admitting: Adult Health

## 2022-08-04 DIAGNOSIS — G4700 Insomnia, unspecified: Secondary | ICD-10-CM

## 2022-08-04 DIAGNOSIS — F3341 Major depressive disorder, recurrent, in partial remission: Secondary | ICD-10-CM | POA: Diagnosis not present

## 2022-08-04 DIAGNOSIS — F411 Generalized anxiety disorder: Secondary | ICD-10-CM

## 2022-08-04 NOTE — Progress Notes (Signed)
Jamie Gamble [id number] 1945/10/09 77 y.o.  Virtual Visit via Video Note  I connected with pt @ on 08/04/22 at  1:20 PM EDT by a video enabled telemedicine application and verified that I am speaking with the correct person using two identifiers.   I discussed the limitations of evaluation and management by telemedicine and the availability of in person appointments. The patient expressed understanding and agreed to proceed.  I discussed the assessment and treatment plan with the patient. The patient was provided an opportunity to ask questions and all were answered. The patient agreed with the plan and demonstrated an understanding of the instructions.   The patient was advised to call back or seek an in-person evaluation if the symptoms worsen or if the condition fails to improve as anticipated.  I provided 15 minutes of non-face-to-face time during this encounter.  The patient was located at home.  The provider was located at [facility].   Dorothyann Gibbs, NP    Subjective:   Patient ID:  Jamie Gamble is a 77 y.o. (DOB [birth year]) female.  Chief Complaint: No chief complaint on file.   HPI [name] presents for follow-up of GAD, MDD and insomnia.  Seeing therapist - Dr. Farrel Demark   Describes mood today as "better". Pleasant. Denies tearfulness. Mood symptoms - reports decreased depression. Denies anxiety and irritability. Denies worry, rumination, and over thinking Mood is improving. Stating "I feel like I'm doing better". Feels like medications are helpful. Varying interest and motivation. Taking medications as prescribed.  Energy levels improved. Active, has a regular exercise routine - walking on the treadmill 25 minutes a day.  Enjoys some usual interests and activities. Single. Lives alone. Not dating. No family local. Spending time with friends. Attends church.  Appetite adequate. Weight loss - 173 from 180 pounds. Sleeps well most nights. Averages 8 hours -  using CPAP nightly. Denies daytime napping. Focus and concentration improved. Completing some tasks. Managing aspects of household. Retired. Denies SI or HI.   Denies AH or VH. Denies self harm. Denies substance use.   Previous ECT treatment.  Review of Systems:  Review of Systems  Musculoskeletal:  Negative for gait problem.  Neurological:  Negative for tremors.  Psychiatric/Behavioral:         Please refer to HPI    Medications: I have reviewed the patient's current medications.  Current Outpatient Medications  Medication Sig Dispense Refill   atorvastatin (LIPITOR) 40 MG tablet Take 1 tablet (40 mg total) by mouth at bedtime. 90 tablet 0   FLUoxetine (PROZAC) 20 MG capsule Take 1 capsule (20 mg total) by mouth daily. 90 capsule 1   Glycerin-Hypromellose-PEG 400 (VISINE DRY EYE OP) Place 1 drop into both eyes daily as needed (dry eyes).     hydrochlorothiazide (MICROZIDE) 12.5 MG capsule TAKE ONE CAPSULE BY MOUTH ONCE DAILY 90 capsule 0   hydrOXYzine (ATARAX) 25 MG tablet Take 1 tablet (25 mg total) by mouth at bedtime as needed. 90 tablet 0   losartan (COZAAR) 25 MG tablet Take 1 tablet (25 mg total) by mouth daily. 90 tablet 1   pantoprazole (PROTONIX) 40 MG tablet Take 1 tablet (40 mg total) by mouth daily. 90 tablet 1   Probiotic Product (PROBIOTIC DAILY PO) Take 1 capsule by mouth daily.     QUEtiapine (SEROQUEL) 25 MG tablet Take 1 tablet (25 mg total) by mouth at bedtime. 30 tablet 0   traZODone (DESYREL) 100 MG tablet Take 1 tablet (100  mg total) by mouth at bedtime. 90 tablet 1   No current facility-administered medications for this visit.    Medication Side Effects: None  Allergies:  Allergies  Allergen Reactions   Nexium [Esomeprazole] Diarrhea    Past Medical History:  Diagnosis Date   Depression    GERD (gastroesophageal reflux disease)    Hyperlipidemia    Hypertension     Family History  Problem Relation Age of Onset   Depression Mother    Cancer  Father    Depression Brother     Social History   Socioeconomic History   Marital status: Single    Spouse name: Not on file   Number of children: 0   Years of education: Not on file   Highest education level: Doctorate  Occupational History   Occupation: Retired [employer] Employee  Tobacco Use   Smoking status: Former   Smokeless tobacco: Never  Building services engineer Use: Never used  Substance and Sexual Activity   Alcohol use: Not Currently   Drug use: Never   Sexual activity: Not on file  Other Topics Concern   Not on file  Social History Narrative   Not on file   Social Determinants of Health   Financial Resource Strain: Low Risk  (02/26/2021)   Overall Financial Resource Strain (CARDIA)    Difficulty of Paying Living Expenses: Not hard at all  Food Insecurity: No Food Insecurity (02/26/2021)   Hunger Vital Sign    Worried About Running Out of Food in the Last Year: Never true    Ran Out of Food in the Last Year: Never true  Transportation Needs: No Transportation Needs (02/26/2021)   PRAPARE - Administrator, Civil Service (Medical): No    Lack of Transportation (Non-Medical): No  Physical Activity: Sufficiently Active (02/26/2021)   Exercise Vital Sign    Days of Exercise per Week: 7 days    Minutes of Exercise per Session: 30 min  Stress: No Stress Concern Present (02/26/2021)   [employer] of Occupational Health - Occupational Stress Questionnaire    Feeling of Stress : Not at all  Social Connections: Unknown (02/26/2021)   Social Connection and Isolation Panel [NHANES]    Frequency of Communication with Friends and Family: More than three times a week    Frequency of Social Gatherings with Friends and Family: More than three times a week    Attends Religious Services: More than 4 times per year    Active Member of [employer] or Organizations: Yes    Attends Engineer, structural: More than 4 times per year    Marital Status: Patient declined   Catering manager Violence: Not on file    Past Medical History, Surgical history, Social history, and Family history were reviewed and updated as appropriate.   Please see review of systems for further details on the patient's review from today.   Objective:   Physical Exam:  There were no vitals taken for this visit.  Physical Exam Constitutional:      General: She is not in acute distress. Musculoskeletal:        General: No deformity.  Neurological:     Mental Status: She is alert and oriented to person, place, and time.     Coordination: Coordination normal.  Psychiatric:        Attention and Perception: Attention and perception normal. She does not perceive auditory or visual hallucinations.        Mood  and Affect: Mood normal. Mood is not anxious or depressed. Affect is not labile, blunt, angry or inappropriate.        Speech: Speech normal.        Behavior: Behavior normal.        Thought Content: Thought content normal. Thought content is not paranoid or delusional. Thought content does not include homicidal or suicidal ideation. Thought content does not include homicidal or suicidal plan.        Cognition and Memory: Cognition and memory normal.        Judgment: Judgment normal.     Comments: Insight intact     Lab Review:     Component Value Date/Time   NA 140 08/13/2021 0905   K 4.6 08/13/2021 0905   CL 104 08/13/2021 0905   CO2 30 08/13/2021 0905   GLUCOSE 100 (H) 08/13/2021 0905   BUN 22 08/13/2021 0905   BUN 15 07/06/2019 0000   CREATININE 0.85 08/13/2021 0905   CALCIUM 9.3 08/13/2021 0905   PROT 6.8 08/13/2021 0905   ALBUMIN 4.0 08/13/2021 0905   AST 19 08/13/2021 0905   ALT 14 08/13/2021 0905   ALKPHOS 48 08/13/2021 0905   BILITOT 0.4 08/13/2021 0905   GFRNONAA >60 06/04/2021 1154       Component Value Date/Time   WBC 5.8 08/13/2021 0905   RBC 4.22 08/13/2021 0905   HGB 13.7 08/13/2021 0905   HCT 40.7 08/13/2021 0905   PLT 303.0 08/13/2021  0905   MCV 96.5 08/13/2021 0905   MCH 31.9 06/04/2021 1154   MCHC 33.7 08/13/2021 0905   RDW 13.8 08/13/2021 0905   LYMPHSABS 0.7 08/13/2021 0905   MONOABS 0.3 08/13/2021 0905   EOSABS 0.2 08/13/2021 0905   BASOSABS 0.1 08/13/2021 0905    No results found for: "POCLITH", "LITHIUM"   No results found for: "PHENYTOIN", "PHENOBARB", "VALPROATE", "CBMZ"   .res Assessment: Plan:    Plan:  Seeing [name] for therapy   PDMP reviewed  Continue:  Prozac 20mg  daily  Trazadone 100mg  - take one tablet at bedtime for sleep  Hydroxyzine 25mg  - 1 at hs for sleep - may d/c  Melatonin 5mg  at hs  Discussed NAC tablets and Magnesium L-threonate.  Followed by [name] - therapy   RTC 4 weeks  Patient advised to contact office with any questions, adverse effects, or acute worsening in signs and symptoms.  Time spent with patient was 25 minutes. Greater than 50% of face to face time with patient was spent on counseling and coordination of care.    Discussed potential metabolic side effects associated with atypical antipsychotics, as well as potential risk for movement side effects. Advised pt to contact office if movement side effects occur.    There are no diagnoses linked to this encounter.  Please see After Visit Summary for patient specific instructions.  Future Appointments  Date Time Provider Department Center  08/05/2022  2:00 PM Robley Fries, PhD CP-CP None  08/26/2022  2:00 PM Robley Fries, PhD CP-[facility] None  09/16/2022 11:00 AM Robley Fries, PhD CP-CP None  10/07/2022 11:00 AM Robley Fries, PhD CP-[facility] None  10/28/2022 11:00 AM Robley Fries, PhD CP-[facility] None  11/18/2022 11:00 AM Robley Fries, PhD CP-CP None    No orders of the defined types were placed in this encounter.     -------------------------------

## 2022-08-05 ENCOUNTER — Ambulatory Visit: Payer: Medicare PPO | Admitting: Psychiatry

## 2022-08-05 DIAGNOSIS — G4733 Obstructive sleep apnea (adult) (pediatric): Secondary | ICD-10-CM | POA: Diagnosis not present

## 2022-08-05 DIAGNOSIS — F411 Generalized anxiety disorder: Secondary | ICD-10-CM | POA: Diagnosis not present

## 2022-08-05 DIAGNOSIS — F3341 Major depressive disorder, recurrent, in partial remission: Secondary | ICD-10-CM

## 2022-08-05 DIAGNOSIS — G4700 Insomnia, unspecified: Secondary | ICD-10-CM | POA: Diagnosis not present

## 2022-08-05 NOTE — Progress Notes (Signed)
Psychotherapy Progress Note [facility] [name], PhD LP  Patient ID: Jamie Gamble [facility])    MRN: [id number] Therapy format: Individual psychotherapy Date: 08/05/2022      Start: 2:10p     Stop: 2:53p     Time Spent: 43 min Location: In-person   Session narrative (presenting needs, interim history, self-report of stressors and symptoms, applications of prior therapy, status changes, and interventions made in session) Off Seroquel now, has permission to wean hydroxyzine now, too.  Diet, exercise, weight loss following [employer] points plan.  Back to [employer] regularly, and now [employer].  Notes a [employer]master[employer] topic to consider what sort of compliments you've been given, and what makes you confident.  Difficult to come up with.  Has decided to relinquish [facility] in favor of a spiritual pursuit to be named later.  Doing her own grocery shopping now, not relying on delivery.  Sleep is holding together well, between self-regulation and CPAP.  Considering work now, too.  [employer] is walking distance, but wouldn't really want to try being on her feet that long.  [name] has looked at a backpack assistance program.  Next big challenge is cataract surgery.  Consulting in June, and anxious about it.  Renewed recommendation to get questions asked and get informed enough to imagine how help happens.  Particularly worried about anesthesia and whether it will precipitate another serious depression.  Discussed plans.  Has clearly come a long way, agreed that as long as she can maintain sleep quality and duration, she is likely to be able to prevent disastrous depressive relapse, and OK to slow down on therapy visits.  Therapeutic modalities: Cognitive Behavioral Therapy, Solution-Oriented/Positive Psychology, and Ego-Supportive  Mental Status/Observations:  Appearance:   Casual     Behavior:  Appropriate  Motor:  Normal  Speech/Language:   Clear and Coherent  Affect:   Appropriate  Mood:  Better motivated  Thought process:  normal  Thought content:    WNL  Sensory/Perceptual disturbances:    WNL  Orientation:  Fully oriented  Attention:  Good    Concentration:  Good  Memory:  WNL  Insight:    Good  Judgment:   Good  Impulse Control:  Good   Risk Assessment: Danger to Self: No Self-injurious Behavior: No Danger to Others: No Physical Aggression / Violence: No Duty to Warn: No Access to Firearms a concern: No  Assessment of progress:  progressing well  Diagnosis:   ICD-10-CM   1. Major depressive disorder, recurrent, in partial remission (HCC)  F33.41     2. Generalized anxiety disorder  F41.1     3. Insomnia, unspecified type  G47.00     4. OSA on CPAP  G47.33      Plan:  Sleep regulation -- Continue orange glasses QPM to facilitate circadian signaling, sleep readiness, sleep hardiness.  Best recommendation dosing sleep-assisting meds up to 1 hr before bed.  Option add melatonin 2-5mg  if needed 1-2 hr ahead of bed.  Practice sleep hygiene, including comfortable arrangements, lights reduced, and all caffeine stopped well ahead of bedtime (at least 6 hrs).  Use soothing imagery PRN for DFA.  For worries, option write down worry thoughts and consciously decide whether to stay up and think or lay them down and sleep.  Adhere to CPAP.  Acknowledge and table worry about whether she will sleep -- trust natural drive for sleep to come around, and if too wakeful, gt out of bed to do something not too  stimulating.  Trust also any uptick in dreams as natural progression of catching up sleep deficits and processing emotional life issues. Physiological mood factors -- Start the day with good lighting, movement/stretching, and a few minutes grounding, outdoors if available/desired.  May need to check vitamin levels incl B12 and D, and anemia.  Continue moderate exercise as able. Weight management -- Concur with modified Weight Watchers approach and improved  food choices.  Make sure to stay mobile, watch portions, control carbs.  May observe practice of leaving a bite, work up concentration of foods contained in the MIND Diet. Self-esteem and intrusive guilt/shame thoughts -- PRN practices of blessing-counting and listing ways people have told/shown her she is lovable/capable.  No requirement to believe or agree, just consider.  Seek to notice and give self credit when she does anything constructive or principled for things that matter to her wellbeing or sense of purpose.  As needed, self-affirm God would not be punishing her, it's a trick of depression. Activities -- Develop activities of interest, as interested.  OK with working, if regains interested.  Self-monitor for broad demotivation.  Options in church activities at 3 locations, and ranging from chair yoga to entertainment/fellowship group to more theological and philosophical study.   Financial concerns -- Follow through fact-finding and meeting with advisor, in coordination with friend/POA [name].  Still OK to seek part-time work she can reach from her home.  Maintain cordial working relationship with nephew/trustee Fraser Din. Support system/social involvement -- Maintain system of friends, [employer]master[employer], any available at housing community.  Re-engage available friends actively, and dispute worry thoughts of being judged.  [employer] available for supportive calls and possible facility-based recreation.  Home care companions if needed.  Self-monitor for return to reclusiveness and depression, and in all involvements practice permission to be authentic with one and all. Placement concerns -- Independent living with PRN assistance should be fine for the foreseeable future.  Available on request to interpret her condition to family members and work out concerns. ECT -- On hold, has cognitively recovered, and shock therapy is highly anxiety-provoking for her.  Pt understands and accepts the burden to  notify treatment team promptly of relapse in dark/dreadful mood, severe demotivation, or intractable insomnia. Procedure anxiety -- Ask all necessary questions of health care, and imagine competence and caring enacted in anesthesia and procedures done under it. Other recommendations/advice as may be noted above Continue to utilize previously learned skills ad lib Maintain medication as prescribed and work faithfully with relevant prescriber(s) if any changes are desired or seem indicated Call the clinic on-call service, 988/hotline, 911, or present to [facility] or ER if any life-threatening psychiatric crisis Return in about 1 month (around 09/05/2022) for time as already scheduled. Already scheduled visit in this office 08/26/2022.  Robley Fries, PhD [name], PhD LP Clinical Psychologist, [facility] [address], [address] [place], [place] [address] [phone]

## 2022-08-13 ENCOUNTER — Other Ambulatory Visit: Payer: Self-pay | Admitting: Internal Medicine

## 2022-08-21 DIAGNOSIS — I10 Essential (primary) hypertension: Secondary | ICD-10-CM | POA: Diagnosis not present

## 2022-08-21 DIAGNOSIS — F419 Anxiety disorder, unspecified: Secondary | ICD-10-CM | POA: Diagnosis not present

## 2022-08-21 DIAGNOSIS — G4733 Obstructive sleep apnea (adult) (pediatric): Secondary | ICD-10-CM | POA: Diagnosis not present

## 2022-08-26 ENCOUNTER — Ambulatory Visit: Payer: Medicare PPO | Admitting: Psychiatry

## 2022-08-26 DIAGNOSIS — F40232 Fear of other medical care: Secondary | ICD-10-CM

## 2022-08-26 DIAGNOSIS — F331 Major depressive disorder, recurrent, moderate: Secondary | ICD-10-CM

## 2022-08-26 DIAGNOSIS — G4700 Insomnia, unspecified: Secondary | ICD-10-CM

## 2022-08-26 DIAGNOSIS — G4733 Obstructive sleep apnea (adult) (pediatric): Secondary | ICD-10-CM | POA: Diagnosis not present

## 2022-08-26 DIAGNOSIS — F411 Generalized anxiety disorder: Secondary | ICD-10-CM

## 2022-08-26 NOTE — Progress Notes (Signed)
Psychotherapy Progress Note [facility] [name], PhD LP  Patient ID: Jamie Gamble [facility])    MRN: [id number] Therapy format: Individual psychotherapy Date: 08/26/2022      Start: 2:05p     Stop: 2:45p     Time Spent: 40 min Location: Telehealth visit -- I connected with this patient by an approved telecommunication method (video), with her informed consent, and verifying identity and patient privacy.  I was located at my office and patient at her home.  As needed, we discussed the limitations, risks, and security and privacy concerns associated with telehealth service, including the availability and conditions which currently govern in-person appointments and the possibility that 3rd-party payment may not be fully guaranteed and she may be responsible for charges.  After she indicated understanding, we proceeded with the session.  Also discussed treatment planning, as needed, including ongoing verbal agreement with the plan, the opportunity to ask and answer all questions, her demonstrated understanding of instructions, and her readiness to call the office should symptoms worsen or she feels she is in a crisis state and needs more immediate and tangible assistance.   Session narrative (presenting needs, interim history, self-report of stressors and symptoms, applications of prior therapy, status changes, and interventions made in session) Last seen 5/14.  Brief f/u with [facility] in EHR, 5/30, notes stable use of CPAP, 6-mo f/u.    On joining video session, Pt is in chair, in bathrobe, looking well overtired.  Turns out she stressed all night over what was to be a visit today to evaluate for cataract surgery.  Totally sleepless last night, wound up cancelling for fear.  Has had her confidence totally shaken by prior medical procedures, thoughts running through her mind about all the experiences he's had feeling mistaken, overlooked, or had scary side effects, including  anesthesia and ECT.  Just can't abide the prospect right now.  Stomach queasy, has only swallowed 3/4 of a sandwich all day.  Condo is darkened right now.  Despite encouragement, does not want to add light to her environment, and virtually no ideas what she would like to do with being awake.  Still off quetiapine, still using CPAP reliably when sleeping.  Only issues for sleep will be if overly anxious or overly hungry, but more or less phobic about not being able to sleep if she doesn't eat, and nausea so far interfering with eating, impression of sleep, appetite, and worry snowballing at this point.  Supportively confronted rehearsing her troubles in a way that sustains anxiety, guilt, and depression.  Validated it's OK to have called off the eye consultation, as cataract treatment is a low-stakes decision so long as she is not driving, and it can wait until she's ready to find out more, and even then she will be able to choose.  Today, this sleep-deprived and triggered, is not a day to make any substantial decisions except just make it through the hours to nora sleep time and catch her next chance at good rest tonight.  Suggested she undoubtedly will, once she calms enough to pause obsessing and come off nauseous response.    Therapeutic modalities: Cognitive Behavioral Therapy, Solution-Oriented/Positive Psychology, and Ego-Supportive  Mental Status/Observations:  Appearance:   Nightclothes, very weary      Behavior:  Rigid  Motor:  Normal  Speech/Language:   Clear and Coherent  Affect:  Appropriate  Mood:  anxious and depressed  Thought process:  normal  Thought content:  Obsessions  Sensory/Perceptual disturbances:    WNL  Orientation:  Fully oriented  Attention:  Good    Concentration:  Fair  Memory:  WNL  Insight:    Fair  Judgment:   Fair  Impulse Control:  Good   Risk Assessment: Danger to Self: No Self-injurious Behavior: No Danger to Others: No Physical Aggression /  Violence: No Duty to Warn: No Access to Firearms a concern: No  Assessment of progress:  situational setback(s)  Diagnosis:   ICD-10-CM   1. Major depressive disorder, recurrent episode, moderate (HCC)  F33.1     2. Fear of other medical care  F40.232     3. Insomnia, unspecified type  G47.00     4. Generalized anxiety disorder  F41.1     5. OSA on CPAP  G47.33      Plan:  Today -- Lay off big decisions, just cope until reasonable sleep time and rededicate to sleep hygiene principles.  Recommend some more light and modest physical activity today. Sleep regulation -- Continue orange glasses QPM to facilitate circadian signaling, sleep readiness, sleep hardiness.  Best recommendation dosing sleep-assisting meds up to 1 hr before bed.  Option add melatonin 2-5mg  if needed 1-2 hr ahead of bed.  Practice sleep hygiene, including comfortable arrangements, lights reduced, and all caffeine stopped well ahead of bedtime (at least 6 hrs).  Use soothing imagery PRN for DFA.  For worries, option write down worry thoughts and consciously decide whether to stay up and think or lay them down and sleep.  Adhere to CPAP.  Acknowledge and table worry about whether she will sleep -- trust natural drive for sleep to come around, and if too wakeful, gt out of bed to do something not too stimulating.  Trust also any uptick in dreams as natural progression of catching up sleep deficits and processing emotional life issues. Physiological mood factors -- Start the day with good lighting, movement/stretching, and a few minutes grounding, outdoors if available/desired.  May need to check vitamin levels incl B12 and D, and anemia.  Continue moderate exercise as able. Weight management -- Concur with modified Weight Watchers approach and improved food choices.  Make sure to stay mobile, watch portions, control carbs.  May observe practice of leaving a bite, work up concentration of foods contained in the MIND  Diet. Self-esteem and intrusive guilt/shame thoughts -- PRN practices of blessing-counting and listing ways people have told/shown her she is lovable/capable.  No requirement to believe or agree, just consider.  Seek to notice and give self credit when she does anything constructive or principled for things that matter to her wellbeing or sense of purpose.  As needed, self-affirm God would not be punishing her, it's a trick of depression. Activities -- Develop activities of interest, as interested.  OK with working, if regains interested.  Self-monitor for broad demotivation.  Options in church activities at 3 locations, and ranging from chair yoga to entertainment/fellowship group to more theological and philosophical study.   Financial concerns -- Follow through fact-finding and meeting with advisor, in coordination with friend/POA [name].  Still OK to seek part-time work she can reach from her home.  Maintain cordial working relationship with nephew/trustee Fraser Din. Support system/social involvement -- Maintain system of friends, Toastmaster's, any available at housing community.  Re-engage available friends actively, and dispute worry thoughts of being judged.  Senior Resources available for supportive calls and possible facility-based recreation.  Home care companions if needed.  Self-monitor for return to  reclusiveness and depression, and in all involvements practice permission to be authentic with one and all. Placement concerns -- Independent living with PRN assistance should be fine for the foreseeable future.  Available on request to interpret her condition to family members and work out concerns. ECT -- On hold, has cognitively recovered, and shock therapy is highly anxiety-provoking for her.  Pt understands and accepts the burden to notify treatment team promptly of relapse in dark/dreadful mood, severe demotivation, or intractable insomnia. Procedure anxiety -- Ask all necessary questions of health  care, and imagine competence and caring enacted in anesthesia and procedures done under it. Other recommendations/advice -- As may be noted above.  Continue to utilize previously learned skills ad lib. Medication compliance -- Maintain medication as prescribed and work faithfully with relevant prescriber(s) if any changes are desired or seem indicated. Crisis service -- Aware of call list and work-in appts.  Call the clinic on-call service, 988/hotline, 911, or present to [facility] or ER if any life-threatening psychiatric crisis. Followup -- Return for time as already scheduled, avail earlier @ PT's need.  Next scheduled visit with me 10/07/2022.  Next scheduled in this office 10/07/2022.  Robley Fries, PhD [name], PhD LP Clinical Psychologist, [facility] [address], [address] [place], [place] [address] [phone]

## 2022-08-30 DIAGNOSIS — G4733 Obstructive sleep apnea (adult) (pediatric): Secondary | ICD-10-CM | POA: Diagnosis not present

## 2022-09-02 ENCOUNTER — Ambulatory Visit (INDEPENDENT_AMBULATORY_CARE_PROVIDER_SITE_OTHER): Payer: Medicare PPO | Admitting: Psychiatry

## 2022-09-02 DIAGNOSIS — F411 Generalized anxiety disorder: Secondary | ICD-10-CM | POA: Diagnosis not present

## 2022-09-02 DIAGNOSIS — G4733 Obstructive sleep apnea (adult) (pediatric): Secondary | ICD-10-CM | POA: Diagnosis not present

## 2022-09-02 DIAGNOSIS — G4700 Insomnia, unspecified: Secondary | ICD-10-CM | POA: Diagnosis not present

## 2022-09-02 DIAGNOSIS — F40232 Fear of other medical care: Secondary | ICD-10-CM | POA: Diagnosis not present

## 2022-09-02 DIAGNOSIS — F331 Major depressive disorder, recurrent, moderate: Secondary | ICD-10-CM

## 2022-09-02 NOTE — Progress Notes (Signed)
Psychotherapy Progress Note [facility] [name], PhD LP  Patient ID: Jamie Gamble [facility])    MRN: [id number] Therapy format: Individual psychotherapy Date: 09/02/2022      Start: 10:15a     Stop: 10:28a     Time Spent: 45 min (incl f/u coaching call on 6/13) Location: Telehealth visit -- I connected with this patient by an approved telecommunication method (video), with her informed consent, and verifying identity and patient privacy.  I was located at my office and patient at her home.  As needed, we discussed the limitations, risks, and security and privacy concerns associated with telehealth service, including the availability and conditions which currently govern in-person appointments and the possibility that 3rd-party payment may not be fully guaranteed and she may be responsible for charges.  After she indicated understanding, we proceeded with the session.  Also discussed treatment planning, as needed, including ongoing verbal agreement with the plan, the opportunity to ask and answer all questions, her demonstrated understanding of instructions, and her readiness to call the office should symptoms worsen or she feels she is in a crisis state and needs more immediate and tangible assistance.   Session narrative (presenting needs, interim history, self-report of stressors and symptoms, applications of prior therapy, status changes, and interventions made in session) Scheduled urgently, offers hope Tx won't be mad at her and how she tries to be a "good patient".  Assured nothing close to any offense taken, only want to address what she needs.  Continues to ruminate about church and feeling abandoned by her peers when she went in the hospital March 2023.  Says she went through food poisoning on Friday, plus sleep's been short every night since last week.  Worrying now about Jamie Gamble pulling away from her, taking an unwarranted hint from [employer], but admittedly she did get  visited by [employer], bringing her soup, and in reality no harm or offense there, either.  Restarted quetiapine and hydroxyzine just last night to get sleep, and was successful.  The mornings have been hard, feeling anxious pressure on waking.  Says she has some food in the house but would rather not bother Jamie Gamble if she can help it.  Reminded her that Jamie Gamble would rather know.  Reviewed emergency backup plans with Adult Protective Services (Intake Line: [phone] After Hours: [phone]).  Very much needs sleep now.  Denies any issues with CPAP.    As she is hypoverbal and very tired, contracted to remainder the rest of the session for a call back in 2 days to check how she's faring, problem-solve further PRN.  Advised she is probably dehydrated and should get an electrolyte drink to recover.  Tentatively agrees.  Therapeutic modalities: Cognitive Behavioral Therapy, Solution-Oriented/Positive Psychology, and Ego-Supportive  Mental Status/Observations:  Appearance:   weary      Behavior:  demotivated  Motor:  Normal  Speech/Language:   tentative  Affect:  Appropriate  Mood:  anxious and depressed  Thought process:  concrete  Thought content:    Rumination  Sensory/Perceptual disturbances:    WNL  Orientation:  Fully oriented  Attention:  Good    Concentration:  Good  Memory:  WNL  Insight:    Fair  Judgment:   Fair  Impulse Control:  Good   Risk Assessment: Danger to Self: No Self-injurious Behavior: No Danger to Others: No Physical Aggression / Violence: No Duty to Warn: No Access to Firearms a concern: No  Assessment of progress:  situational  setback(s)  Diagnosis:   ICD-10-CM   1. Major depressive disorder, recurrent episode, moderate (HCC)  F33.1     2. Fear of other medical care  F40.232     3. Insomnia, unspecified type  G47.00     4. Generalized anxiety disorder  F41.1     5. OSA on CPAP  G47.33      Plan:  Acutely -- Treat dehydration s/p food  poisoning, restore electrolytes and sleep, maintain faith that friends and health care all are favorable to her, callback in 2 days to check Sleep regulation -- Continue orange glasses QPM to facilitate circadian signaling, sleep readiness, sleep hardiness.  Best recommendation dosing sleep-assisting meds up to 1 hr before bed.  Option add melatonin 2-5mg  if needed 1-2 hr ahead of bed.  Practice sleep hygiene, including comfortable arrangements, lights reduced, and all caffeine stopped well ahead of bedtime (at least 6 hrs).  Use soothing imagery PRN for DFA.  For worries, option write down worry thoughts and consciously decide whether to stay up and think or lay them down and sleep.  Adhere to CPAP.  Acknowledge and table worry about whether she will sleep -- trust natural drive for sleep to come around, and if too wakeful, gt out of bed to do something not too stimulating.  Trust also any uptick in dreams as natural progression of catching up sleep deficits and processing emotional life issues. Physiological mood factors -- Start the day with good lighting, movement/stretching, and a few minutes grounding, outdoors if available/desired.  May need to check vitamin levels incl B12 and D, and anemia.  Continue moderate exercise as able. Weight management -- Concur with modified Weight Watchers approach and improved food choices.  Make sure to stay mobile, watch portions, control carbs.  May observe practice of leaving a bite, work up concentration of foods contained in the MIND Diet. Self-esteem and intrusive guilt/shame thoughts -- PRN practices of blessing-counting and listing ways people have told/shown her she is lovable/capable.  No requirement to believe or agree, just consider.  Seek to notice and give self credit when she does anything constructive or principled for things that matter to her wellbeing or sense of purpose.  As needed, self-affirm God would not be punishing her, it's a trick of  depression. Activities -- Develop activities of interest, as interested.  OK with working, if regains interested.  Self-monitor for broad demotivation.  Options in church activities at 3 locations, and ranging from chair yoga to entertainment/fellowship group to more theological and philosophical study.   Financial concerns -- Follow through fact-finding and meeting with advisor, in coordination with friend/POA Jamie Gamble.  Still OK to seek part-time work she can reach from her home.  Maintain cordial working relationship with nephew/trustee Jamie Gamble. Support system/social involvement -- Maintain system of friends, Toastmaster[employer], any available at housing community.  Re-engage available friends actively, and dispute worry thoughts of being judged.  Senior Resources available for supportive calls and possible facility-based recreation.  Home care companions if needed.  Self-monitor for return to reclusiveness and depression, and in all involvements practice permission to be authentic with one and all. Placement concerns -- Independent living with PRN assistance should be fine for the foreseeable future.  Available on request to interpret her condition to family members and work out concerns. ECT -- On hold, has cognitively recovered, and shock therapy is highly anxiety-provoking for her.  Pt understands and accepts the burden to notify treatment team promptly of relapse in dark/dreadful mood,  severe demotivation, or intractable insomnia. Procedure anxiety -- Ask all necessary questions of health care, and imagine competence and caring enacted in anesthesia and procedures done under it. Other recommendations/advice -- As may be noted above.  Continue to utilize previously learned skills ad lib. Medication compliance -- Maintain medication as prescribed and work faithfully with relevant prescriber(s) if any changes are desired or seem indicated. Crisis service -- Aware of call list and work-in appts.  Call the clinic  on-call service, 988/hotline, 911, or present to [facility] or ER if any life-threatening psychiatric crisis. Followup -- Return for time as already scheduled.  Next scheduled visit with me 10/07/2022.  Next scheduled in this office 10/07/2022.  Call back Thursday 6/13, 5:45pm -- Reached by phone, clearly much better rested and in better spirits, relaxed sounding and quick to answer.  Got herself some electrolyte drink as recommended, plus 2 nights of sound sleep, and feels much, much better now.  No c/o worry, social isolation, perceived rejection, depressed mood, etc., suggesting her acute dysphoria may have had much to do with acute sleep deprivation -- possibly interacting with post-stroke effects -- to exaggerate mood and rumination to the point where she "visits" the severe depression experienced before, without actually relapsing.  Affirmed self-care with electrolyte, confirmed her judgment that she will be able to deal well with current delay to next scheduled contact in latter July.  Reaffirmed she knows how to get in touch and would be willing to ask to move up appt if needed.  Robley Fries, PhD [name], PhD LP Clinical Psychologist, [facility] [address], [address] [place], [place] [address] [phone]

## 2022-09-12 ENCOUNTER — Telehealth: Payer: Self-pay | Admitting: Adult Health

## 2022-09-12 ENCOUNTER — Other Ambulatory Visit: Payer: Self-pay | Admitting: Adult Health

## 2022-09-12 DIAGNOSIS — G4700 Insomnia, unspecified: Secondary | ICD-10-CM

## 2022-09-12 NOTE — Telephone Encounter (Signed)
Yes

## 2022-09-12 NOTE — Telephone Encounter (Signed)
Please see message from patient.  You discontinued Seroquel at last visit. I received a RF request from pharmacy today and refused it, so that might have caused her to call.  Is it okay to RF?

## 2022-09-12 NOTE — Telephone Encounter (Signed)
Pt called to say that she HAS been taking the Seroquel 25mg  at bedtime to help with sleep. She has restarted taking it past two weeks. Can it be refilled? Send to:  [facility] [place], [place] - [address]  [address] [place], [place]

## 2022-09-14 MED ORDER — QUETIAPINE FUMARATE 25 MG PO TABS: 25.0000 mg | ORAL_TABLET | Freq: Every day | ORAL | 0 refills | Status: DC

## 2022-09-14 NOTE — Telephone Encounter (Signed)
Sent!

## 2022-09-16 ENCOUNTER — Ambulatory Visit: Payer: Medicare PPO | Admitting: Psychiatry

## 2022-09-29 DIAGNOSIS — G4733 Obstructive sleep apnea (adult) (pediatric): Secondary | ICD-10-CM | POA: Diagnosis not present

## 2022-10-07 ENCOUNTER — Ambulatory Visit: Payer: Medicare PPO | Admitting: Psychiatry

## 2022-10-09 ENCOUNTER — Telehealth: Payer: Self-pay | Admitting: Adult Health

## 2022-10-09 NOTE — Telephone Encounter (Signed)
Does she want to restart the Seroquel?

## 2022-10-09 NOTE — Telephone Encounter (Signed)
Pt reporting depression and anxiety at 7/10, worry over health concerns - possible cataract surgery. She canceled several appts with Mardelle Matte and I asked if she felt that was contributing to her depression and she said she is just so exhausted, doesn't get out much now, but didn't feel that contributed to depression. She is able to get to sleep, but sleep is restless and she has NM, gets 6-7 hours. She reports Seroquel was stopped, doesn't feel the fluoxetine is working now, though it did initially. She was last seen in May, have asked admin to schedule an appt.

## 2022-10-09 NOTE — Telephone Encounter (Signed)
Pt called and said that she doesn't feel like the prozac is working. She is till very depressed. She wants to know if gina will give her something else to help her depression. Please call her at [phone]

## 2022-10-09 NOTE — Telephone Encounter (Signed)
Please call to schedule FU with Jamie Gamble.

## 2022-10-14 NOTE — Telephone Encounter (Signed)
Yes, 8/13.  I'll have admin call to try to move it up.

## 2022-10-14 NOTE — Telephone Encounter (Signed)
Please call and see if you can schedule an earlier appt with Almira Coaster. She has an appt with Mardelle Matte on 8/6.

## 2022-10-14 NOTE — Telephone Encounter (Signed)
Patient said she restarted Seroquel approximately 3 weeks ago.

## 2022-10-15 NOTE — Telephone Encounter (Signed)
Let patient know that Almira Coaster wanted to see in the office to evaluate.

## 2022-10-16 ENCOUNTER — Other Ambulatory Visit: Payer: Self-pay | Admitting: Adult Health

## 2022-10-20 NOTE — Telephone Encounter (Signed)
Apt 7/31 in office

## 2022-10-22 ENCOUNTER — Ambulatory Visit (INDEPENDENT_AMBULATORY_CARE_PROVIDER_SITE_OTHER): Payer: Medicare PPO | Admitting: Adult Health

## 2022-10-22 ENCOUNTER — Encounter: Payer: Self-pay | Admitting: Adult Health

## 2022-10-22 DIAGNOSIS — F331 Major depressive disorder, recurrent, moderate: Secondary | ICD-10-CM

## 2022-10-22 DIAGNOSIS — F411 Generalized anxiety disorder: Secondary | ICD-10-CM | POA: Diagnosis not present

## 2022-10-22 NOTE — Progress Notes (Signed)
Jamie Gamble [id number] 1945/12/19 77 y.o.  Subjective:   Patient ID:  Jamie Gamble is a 77 y.o. (DOB August 30, 1945) female.  Chief Complaint: No chief complaint on file.   HPI Jamie Gamble presents to the office today for follow-up of GAD and MDD.  Seeing therapist - Dr. Farrel Demark   Describes mood today as "not too good". Pleasant. Denies tearfulness. Mood symptoms - reports increased depression and anxiety. Denies irritability. Denies panic attacks. Reports worry, rumination, and over thinking . Mood is lower. Stating "I'm not doing good". Feels like the precipitant of mood decline is related to worry surrounding cataract surgery. Has been staying home - not leaving the house. Has sitters come for 4 hours a day to help with meals ad light cleaning. Feels like medications are helpful, but may need to be adjustment. Varying interest and motivation. Taking medications as prescribed.  Energy levels lower. Active, does no have a regular exercise routine. Enjoys some usual interests and activities. Single. Lives alone. Not dating. No family local. Spending time with friends. Attends church.  Appetite adequate. Weight loss - 170 to 175 pounds. Sleeps better some nights than others. Averages 5 to 6 hours - using CPAP nightly. Denies daytime napping. Focus and concentration improved. Completing some tasks. Managing aspects of household. Retired. Denies SI or HI.   Denies AH or VH. Denies self harm. Denies substance use.   Previous ECT treatment.   PHQ2-9    Flowsheet Row Video Visit from 02/11/2022 in [facility] [place] [facility] from 11/28/2021 in [facility] Office Visit from 08/13/2021 in [facility] [place] Office Visit from 04/04/2021 in [facility] [place] Office Visit from 02/27/2021 in [facility] [place]  PHQ-2 Total Score 0 2 0 0 0  PHQ-9 Total Score 4 7 0 -- 0       Flowsheet Row ED from 06/04/2021 in [facility] Emergency Department at [facility]  C-SSRS RISK CATEGORY No Risk        Review of Systems:  Review of Systems  Musculoskeletal:  Negative for gait problem.  Neurological:  Negative for tremors.  Psychiatric/Behavioral:         Please refer to HPI    Medications: I have reviewed the patient's current medications.  Current Outpatient Medications  Medication Sig Dispense Refill   atorvastatin (LIPITOR) 40 MG tablet TAKE ONE TABLET BY MOUTH AT BEDTIME 90 tablet 0   FLUoxetine (PROZAC) 20 MG capsule Take 1 capsule (20 mg total) by mouth daily. 90 capsule 1   Glycerin-Hypromellose-PEG 400 (VISINE DRY EYE OP) Place 1 drop into both eyes daily as needed (dry eyes).     hydrochlorothiazide (MICROZIDE) 12.5 MG capsule TAKE ONE CAPSULE BY MOUTH ONCE DAILY 90 capsule 0   hydrOXYzine (ATARAX) 25 MG tablet Take 1 tablet (25 mg total) by mouth at bedtime as needed. 90 tablet 0   losartan (COZAAR) 25 MG tablet Take 1 tablet (25 mg total) by mouth daily. 90 tablet 1   pantoprazole (PROTONIX) 40 MG tablet Take 1 tablet (40 mg total) by mouth daily. 90 tablet 1   Probiotic Product (PROBIOTIC DAILY PO) Take 1 capsule by mouth daily.     QUEtiapine (SEROQUEL) 25 MG tablet Take 1 tablet (25 mg total) by mouth at bedtime. 30 tablet 0   traZODone (DESYREL) 100 MG tablet Take 1 tablet (100 mg total) by mouth at bedtime. 90 tablet 1  No current facility-administered medications for this visit.    Medication Side Effects: None  Allergies:  Allergies  Allergen Reactions   Nexium [Esomeprazole] Diarrhea    Past Medical History:  Diagnosis Date   Depression    GERD (gastroesophageal reflux disease)    Hyperlipidemia    Hypertension     Past Medical History, Surgical history, Social history, and Family history were reviewed and updated as appropriate.   Please see review of systems for further details on the patient's review from  today.   Objective:   Physical Exam:  There were no vitals taken for this visit.  Physical Exam Constitutional:      General: She is not in acute distress. Musculoskeletal:        General: No deformity.  Neurological:     Mental Status: She is alert and oriented to person, place, and time.     Coordination: Coordination normal.  Psychiatric:        Attention and Perception: Attention and perception normal. She does not perceive auditory or visual hallucinations.        Mood and Affect: Mood normal. Mood is not anxious or depressed. Affect is not labile, blunt, angry or inappropriate.        Speech: Speech normal.        Behavior: Behavior normal.        Thought Content: Thought content normal. Thought content is not paranoid or delusional. Thought content does not include homicidal or suicidal ideation. Thought content does not include homicidal or suicidal plan.        Cognition and Memory: Cognition and memory normal.        Judgment: Judgment normal.     Comments: Insight intact     Lab Review:     Component Value Date/Time   NA 140 08/13/2021 0905   K 4.6 08/13/2021 0905   CL 104 08/13/2021 0905   CO2 30 08/13/2021 0905   GLUCOSE 100 (H) 08/13/2021 0905   BUN 22 08/13/2021 0905   BUN 15 07/06/2019 0000   CREATININE 0.85 08/13/2021 0905   CALCIUM 9.3 08/13/2021 0905   PROT 6.8 08/13/2021 0905   ALBUMIN 4.0 08/13/2021 0905   AST 19 08/13/2021 0905   ALT 14 08/13/2021 0905   ALKPHOS 48 08/13/2021 0905   BILITOT 0.4 08/13/2021 0905   GFRNONAA >60 06/04/2021 1154       Component Value Date/Time   WBC 5.8 08/13/2021 0905   RBC 4.22 08/13/2021 0905   HGB 13.7 08/13/2021 0905   HCT 40.7 08/13/2021 0905   PLT 303.0 08/13/2021 0905   MCV 96.5 08/13/2021 0905   MCH 31.9 06/04/2021 1154   MCHC 33.7 08/13/2021 0905   RDW 13.8 08/13/2021 0905   LYMPHSABS 0.7 08/13/2021 0905   MONOABS 0.3 08/13/2021 0905   EOSABS 0.2 08/13/2021 0905   BASOSABS 0.1 08/13/2021 0905     No results found for: "POCLITH", "LITHIUM"   No results found for: "PHENYTOIN", "PHENOBARB", "VALPROATE", "CBMZ"   .res Assessment: Plan:    Plan:  Seeing [name] for therapy   PDMP reviewed  Continue:  Increase Prozac 20mg  to 40mg  daily  Trazadone 100mg  - take one tablet at bedtime for sleep Seroquel 25mg  at hs  Hydroxyzine 25mg  - 1 at hs for sleep    Melatonin 5mg  at hs  Discussed NAC tablets and Magnesium L-threonate.  Followed by [name] - therapy   RTC 2 weeks - phone.  Patient advised to contact office with  any questions, adverse effects, or acute worsening in signs and symptoms.  Time spent with patient was 25 minutes. Greater than 50% of face to face time with patient was spent on counseling and coordination of care.    Discussed potential metabolic side effects associated with atypical antipsychotics, as well as potential risk for movement side effects. Advised pt to contact office if movement side effects occur.   There are no diagnoses linked to this encounter.   Please see After Visit Summary for patient specific instructions.  Future Appointments  Date Time Provider Department Center  10/28/2022 11:00 AM Robley Fries, PhD CP-CP None  11/25/2022  2:00 PM Robley Fries, PhD CP-CP None    No orders of the defined types were placed in this encounter.   -------------------------------

## 2022-10-28 ENCOUNTER — Telehealth: Payer: Self-pay | Admitting: *Deleted

## 2022-10-28 ENCOUNTER — Ambulatory Visit (INDEPENDENT_AMBULATORY_CARE_PROVIDER_SITE_OTHER): Payer: Medicare PPO | Admitting: Psychiatry

## 2022-10-28 DIAGNOSIS — F332 Major depressive disorder, recurrent severe without psychotic features: Secondary | ICD-10-CM | POA: Diagnosis not present

## 2022-10-28 DIAGNOSIS — Z9189 Other specified personal risk factors, not elsewhere classified: Secondary | ICD-10-CM | POA: Diagnosis not present

## 2022-10-28 DIAGNOSIS — G4733 Obstructive sleep apnea (adult) (pediatric): Secondary | ICD-10-CM | POA: Diagnosis not present

## 2022-10-28 DIAGNOSIS — F40232 Fear of other medical care: Secondary | ICD-10-CM | POA: Diagnosis not present

## 2022-10-28 DIAGNOSIS — G4700 Insomnia, unspecified: Secondary | ICD-10-CM

## 2022-10-28 DIAGNOSIS — F411 Generalized anxiety disorder: Secondary | ICD-10-CM | POA: Diagnosis not present

## 2022-10-28 NOTE — Progress Notes (Unsigned)
Psychotherapy Progress Note [facility] [name], PhD LP  Patient ID: Jamie Gamble [facility])    MRN: [id number] Therapy format: Individual psychotherapy Date: 10/28/2022      Start: 11:18a     Stop: 11:58a     Time Spent: 40 min + health care communications, total > 1hr Location: Telehealth visit -- I connected with this patient by an approved telecommunication method (video), with her informed consent, and verifying identity and patient privacy.  I was located at my office and patient at her home.  As needed, we discussed the limitations, risks, and security and privacy concerns associated with telehealth service, including the availability and conditions which currently govern in-person appointments and the possibility that 3rd-party payment may not be fully guaranteed and she may be responsible for charges.  After she indicated understanding, we proceeded with the session.  Also discussed treatment planning, as needed, including ongoing verbal agreement with the plan, the opportunity to ask and answer all questions, her demonstrated understanding of instructions, and her readiness to call the office should symptoms worsen or she feels she is in a crisis state and needs more immediate and tangible assistance.   Session narrative (presenting needs, interim history, self-report of stressors and symptoms, applications of prior therapy, status changes, and interventions made in session) Not doing too well.  Medication adjustments under way.  A lot of waking up during the night, not clear if anything on her mind.  Appetite off, but does make herself eat.  Feels like she's getting enough fluids, hard to tell.  [name] has come by, concerned for her.  For herself, worrying about her health.  "I don't know" responses to questions about what she could use or wish for.  Wish for more energy, and more will to be around people.  Been off [employer], even, for lost will to want to be around people.   No clarity on plans for Ann to drop by, then says she does know she wants to come over.  Grants permission to contact [place] for more info.  Did have a home companion coming over for a couple weeks, but worried about the expense, stopped it.  Denies actual trouble bathing or dressing, just takes a while to want to and feel the energy.  Takes MVI and vitamin D, both reliably, she says.  Meds -- Prozac at 40mg  now for 1 week, but too soon to feel a difference.  Says she would like to take up Dr. Kallie Locks on the offer of adding Wellbutrin but wants Tx to pass the message for her, says she's had trouble getting messages through front staff (?).  Agreed to inform prescriber but encouraged to try anyway making her own contact, in order to challenge assumptions and depressive avoidance.  Appearance very pale on camera,with indoor lighting somewhat better than before, looks anemic.  No known blood testing of late, feels she couldn't make it to the PCP office to do that, even if she knew an office visit could identify a crucial factor, like low B12, renewed vit D deficiency, or anemia.  Noticed per EHR it's been 9 months since last PCP contact, and that was only a telehealth consultation about weight management, while worried about Remeron causing weight gain, and the only discernible orders were un-realized referral to nutritional counseling.  Recommended update testing, and offer made to contact PCP about whether home health could be requested (unusually) to come draw at home, as might be done for someone in  an elder care facility.  Agrees.  Based on flagging will to interact and frequent "I don't know" responses, decided to break a bit early and commit remaining time to car team communications.  After session actions: Staff messaged PCP requesting clarification of available home health and/or social work assistance, home health referral made same day 8/7 Pt called for herself, as encouraged, to take up Dr. Kallie Locks  on the offer of Wellbutrin Noted later labs drawn 8/9, all remarkably normal Chart review 30 min 8/14 in response to inquiry from friend/POA/proxy Herschell Dimes, also noted to have an open phone request to Dr. Kallie Locks.  RTC noted separately [phone]). TC with [name] 8/14 -- TC with psychiatry just before this, addressed whether her condition needed hospitalization.  Says until June they were having engaging conversations, getting regular meals out, and Malissie would go out with her on mobile meals runs.  Noted by Tx that she had spoiled sleep and equally bad off on a short-term basis when facing cataract surgery and had the erroneous idea that she would undergo general anesthesia, but galvanized within a couple days and credibly said she would be fine with continuing therapy schedule as previously set, for several weeks out.  By 3rd week July, Sudiksha brought up having home health care come back in like last summer, during the week [name] was [place], and [name] worked it out with her reluctant nephew/trustee.  [name] has hunch that summertime is particularly aversive to her, noticed she got herself on a ladder to place cushions to block part of a patio door.  Last few days, Saundra has fallen off her usual habit of responding promptly to Ann's texts, thought thy do tal daily, on way or another.  Agreed she is in need of assertive intervention, but right now, she does not seem to meet inpatient criteria for dangerousness.  Current agreement with [name] that she will look in on her 2/wk, keep trying to have some kind of daily chat, and get in touch with PCP to connect and inform the [facility] social worker PCP called in.  Informed her that blood work was normal, current interpretation this depression looks very psychogenic, it does match both a summer blues pattern and longstanding dysthymia and self-image as a loser in life who never married or had family and got ejected from her career.  Unfortunately, Tx schedule is spaced out 3 weeks,  so it will make best sense for psychiatry to evaluate 8/15 as planned (video, unfortunately), for Ann to try to organize more social exposure and activation either by getting Yozelin out or by helping make sure in-home services come to her and raise response cost for holing up alone.  Educated on the difference between home health and home companion, what health insurance does cover, and tactical recommendations for motivating [facility] to accept stimulus and for trustee to see the wisdom of paying for companion service (e.g., if more economical and effective than hospital).  Agrees, with aim of try to make sure it's harder to hole up in her house without having to interact with people.    Therapeutic modalities: Cognitive Behavioral Therapy, Solution-Oriented/Positive Psychology, Ego-Supportive, and Motivational Interviewing plus treatment team consulting  Mental Status/Observations:  Appearance:   Basically groomed, pale, drawn      Behavior:  Resistant and passive  Motor:  Normal and still  Speech/Language:   Clear and Coherent and minimal  Affect:  Flat  Mood:  depressed  Thought process:  normal  Thought content:  Rumination  Sensory/Perceptual disturbances:    WNL  Orientation:  grossly intact  Attention:  grossly intact    Concentration:  grossly intact  Memory:  grossly intact  Insight:    Fair  Judgment:   Fair  Impulse Control:  Good   Risk Assessment: Danger to Self: No Self-injurious Behavior:  gradual self-neglect Danger to Others: No Physical Aggression / Violence: No Duty to Warn: No Access to Firearms a concern: No  Assessment of progress:  deteriorating -- in need of reassessment  Diagnosis:   ICD-10-CM   1. 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 Generalized anxiety disorder  F41.1     3. Fear of other medical care  F40.232     4. Insomnia, unspecified type  G47.00     5. OSA on CPAP  G47.33     6.  At increased risk for social isolation  Z91.89     7. Severe episode of recurrent major depressive disorder, without psychotic features (HCC)  F33.2      Plan:  Deterioration plan -- see above; psychiatry next assessment, friend/POA will involve with social work and PCP to work out what is allowable and credible home intervention, as ECT will be resisted, and hospitalization currently looks below criteria.  Also will press further for her to rise, dress, interact, go out again, and investigate sunlight sensitivity vs. hating summer and obsessing about perceived failures. Sleep regulation -- Should continue orange glasses QPM to facilitate circadian signaling, sleep readiness, and sleep hardiness.  Best recommendation dosing sleep-assisting meds up to 1 hr before bed.  Option add melatonin 2-5mg  if needed 1-2 hr ahead of bed.  Practice sleep hygiene, including comfortable arrangements, lights reduced, and all caffeine stopped well ahead of bedtime (at least 6 hrs).  Use soothing imagery PRN for DFA.  Should adhere to CPAP and ensure proper maintenance.  For worries, option write down worry thoughts and consciously decide whether to stay up and think or lay them down and sleep.  Acknowledge and table worry about whether she will sleep -- trust the natural drive for sleep to come around, and if too wakeful, get out of bed to do something not too stimulating.  Trust also any uptick in dreams as natural progression of catching up sleep deficits and processing emotional life issues. Physiological mood factors -- Start the day with good lighting, movement/stretching, and a few minutes grounding, outdoors if available/desired.  Vitamin levels are normal, but improved B12 and D could still be helpful.  Should get some moderate exercise as able, even just walking and stretching, to combat depressogenic inactivity effects. Weight management -- Concur with modified Weight Watchers approach and improved food choices at  her interest.  Make sure to stay mobile, watch portions, limit carbs.  May observe practice of leaving a bite, work up concentration of foods contained in the MIND Diet. Self-esteem and intrusive guilt/shame thoughts -- PRN practices of blessing-counting and listing ways people have told/shown her she is lovable/capable.  No requirement to believe or agree, just consider.  Seek to notice and give self credit when she does anything constructive or principled for things that matter to her wellbeing or sense of purpose.  As needed, self-affirm God would not be punishing her, it's a trick of depression. Activities -- Develop activities of interest.  OK with working, if regains interested.  Self-monitor for broad demotivation.  Options in church activities at  3 locations, and ranging from chair yoga to entertainment/fellowship group to more theological and philosophical study.   Financial concerns -- Follow through fact-finding and meeting with advisor, in coordination with friend/POA [name].  Still OK to seek part-time work she can reach from her home.  Maintain cordial working relationship with nephew/trustee Fraser Din. Support system/social involvement -- Maintain system of friends, [employer], any available at housing community.  Re-engage available friends actively, and dispute worry thoughts of being judged.  Sen[employer] available for supportive calls and possible facility-based recreation.  Home care companions if needed.  Self-monitor for return to reclusiveness and depression, and in all involvements practice permission to be authentic with one and all. Placement concerns -- Independent living with PRN assistance should be fine for the foreseeable future.  Available on request to interpret her condition to family members and work out concerns. ECT -- On hold, has cognitively recovered for months, and the idea of shock therapy (and anesthesia) is highly anxiety-provoking to her.  Pt understands and  accepts the burden to notify treatment team promptly of relapse in dark/dreadful mood, severe demotivation, or intractable insomnia. Medical procedure anxiety -- Ask all necessary questions of health care, make sure to adequately comprehend recommendations rather than catastrophize.  For any procedure accepted, imagine th competence and caring of health care personnel, especially if it involves anesthesia and other vulnerable-making procedures. Other recommendations/advice -- As may be noted above.  Continue to utilize previously learned skills ad lib. Medication compliance -- Maintain medication as prescribed and work faithfully with relevant prescriber(s) if any changes are desired or seem indicated. Crisis service -- Aware of call list and work-in appts.  Call the clinic on-call service, 988/hotline, 911, or present to [facility] or ER if any life-threatening psychiatric crisis. Followup -- Return in about 2 weeks (around 11/11/2022) for recommend sched ahead, avail earlier @ PT's need.  Next scheduled visit with me 11/25/2022.  Next scheduled in this office 11/06/2022.    Robley Fries, PhD [name], PhD LP Clinical Psychologist, [facility] [address], [address] [place], [place] [address] [phone]

## 2022-10-28 NOTE — Telephone Encounter (Signed)
-----   Message from [name] sent at 10/28/2022  2:18 PM EDT ----- Regarding: FW: inquiring about special needs on a shared patient See below. Can we see if we can get HH out for lab draws (CBC, CMP, VitD and Vit B12) or involve [facility]? ----- Message ----- From: Robley Fries, PhD Sent: 10/28/2022   2:08 PM EDT To: Henderson Cloud, MD Subject: inquiring about special needs on a shared pa#  Dr. Loreta Ave, I'm writing because Lenea may have some medical needs she's neglected which would compromise her ability to care for herself, and I'd like to figure out if there is something we can do as a team for outreach.  A couple times I've known primary cares for other older, very depressed patients living alone to set up some amount of home health.    Looks like Jacqui has not seen you in person for a year or so, and she has a history of B12, D, and anemia issues, all of which can contribute to severe depression.  Right now, she's in a state where she maintains nutrition and meds, we think, but inconsistently dressing, not going out for anything, she has one friend who will drop in, she routinely schedules behavioral health by video, and she looks pretty pale and drawn on camera.  Says she can't abide coming into the office for a primary care visit, even if she could be sure it would find something treatable.  From my perspective, I'd hate to collude with that and miss it if she's badly anemic or B-deficient and we can get her built back up.  From a primary care perspective, what forms of home health can be authorized for a homebound single patient?  Could she, for example, get a blood draw at home, like some retirement home residents, or a health and wellness check from a [facility] nurse?  [name], PhD LP Clinical Psychologist, [facility] [address], [address] [place], [place] [address] [phone]

## 2022-10-28 NOTE — Telephone Encounter (Signed)
Referral placed for home health

## 2022-10-29 ENCOUNTER — Telehealth: Payer: Self-pay | Admitting: Adult Health

## 2022-10-29 NOTE — Telephone Encounter (Signed)
Pt called and said that she would like to start the wellbutrin. They discussed it at the last meeting. Pharmacy is [facility]

## 2022-10-30 ENCOUNTER — Other Ambulatory Visit: Payer: Self-pay | Admitting: Internal Medicine

## 2022-10-30 DIAGNOSIS — F320 Major depressive disorder, single episode, mild: Secondary | ICD-10-CM | POA: Diagnosis not present

## 2022-10-30 DIAGNOSIS — I10 Essential (primary) hypertension: Secondary | ICD-10-CM | POA: Diagnosis not present

## 2022-10-30 DIAGNOSIS — F411 Generalized anxiety disorder: Secondary | ICD-10-CM | POA: Diagnosis not present

## 2022-10-30 DIAGNOSIS — Z556 Problems related to health literacy: Secondary | ICD-10-CM | POA: Diagnosis not present

## 2022-10-30 DIAGNOSIS — G4733 Obstructive sleep apnea (adult) (pediatric): Secondary | ICD-10-CM | POA: Diagnosis not present

## 2022-10-30 DIAGNOSIS — K219 Gastro-esophageal reflux disease without esophagitis: Secondary | ICD-10-CM | POA: Diagnosis not present

## 2022-10-30 DIAGNOSIS — I890 Lymphedema, not elsewhere classified: Secondary | ICD-10-CM | POA: Diagnosis not present

## 2022-10-30 DIAGNOSIS — F332 Major depressive disorder, recurrent severe without psychotic features: Secondary | ICD-10-CM | POA: Diagnosis not present

## 2022-10-30 DIAGNOSIS — Z604 Social exclusion and rejection: Secondary | ICD-10-CM | POA: Diagnosis not present

## 2022-10-30 DIAGNOSIS — F32A Depression, unspecified: Secondary | ICD-10-CM | POA: Diagnosis not present

## 2022-10-30 DIAGNOSIS — E785 Hyperlipidemia, unspecified: Secondary | ICD-10-CM | POA: Diagnosis not present

## 2022-10-30 MED ORDER — BUPROPION HCL ER (XL) 150 MG PO TB24: 150.0000 mg | ORAL_TABLET | Freq: Every morning | ORAL | 1 refills | Status: DC

## 2022-10-30 NOTE — Telephone Encounter (Signed)
Sent Rx 

## 2022-10-30 NOTE — Telephone Encounter (Signed)
Please see message. Let me know details and I'll send script.

## 2022-10-31 ENCOUNTER — Telehealth: Payer: Self-pay | Admitting: Internal Medicine

## 2022-10-31 LAB — LAB REPORT - SCANNED: EGFR: 82

## 2022-10-31 NOTE — Telephone Encounter (Signed)
Called Jamie Gamble she is aware

## 2022-10-31 NOTE — Telephone Encounter (Signed)
Jamie Gamble case manager with [facility] is calling to confirm we received lab order from [facility] the results in chart

## 2022-11-03 ENCOUNTER — Telehealth: Payer: Self-pay | Admitting: Adult Health

## 2022-11-03 ENCOUNTER — Other Ambulatory Visit: Payer: Self-pay | Admitting: Adult Health

## 2022-11-03 NOTE — Telephone Encounter (Signed)
Responded to via pharmacy RF request.

## 2022-11-03 NOTE — Telephone Encounter (Signed)
LF 7/26.

## 2022-11-03 NOTE — Telephone Encounter (Signed)
Jamie Gamble called at 1:55pm to request RF of Seroquel 25mg  RX. Please send to [facility]- they deliver to her.

## 2022-11-04 ENCOUNTER — Telehealth: Payer: Medicare PPO | Admitting: Adult Health

## 2022-11-04 NOTE — Telephone Encounter (Signed)
Addendum to attached message. Herschell Dimes, [place] for Jamie Gamble called and said that she is spiraling down, condition is declining and in a depressed state. [name] is concerned about her and would like to know what she can do to help her. Ann's phone number is [phone].

## 2022-11-05 NOTE — Telephone Encounter (Signed)
Patient has a MyChart visit with you tomorrow. Jamie Gamble has another appt and probably will not be there to participate in visit. Patient recently had labs thru PCP. Vitamin D was 43. Jamie Gamble is requesting you strongly suggest patient take OTC vitamin D.

## 2022-11-06 ENCOUNTER — Telehealth (INDEPENDENT_AMBULATORY_CARE_PROVIDER_SITE_OTHER): Payer: Medicare PPO | Admitting: Adult Health

## 2022-11-06 ENCOUNTER — Encounter: Payer: Self-pay | Admitting: Adult Health

## 2022-11-06 DIAGNOSIS — F411 Generalized anxiety disorder: Secondary | ICD-10-CM

## 2022-11-06 DIAGNOSIS — F332 Major depressive disorder, recurrent severe without psychotic features: Secondary | ICD-10-CM | POA: Diagnosis not present

## 2022-11-06 NOTE — Progress Notes (Addendum)
Jamie Gamble [id number] May 02, 1945 77 y.o.  Virtual Visit via Video Note  I connected with pt @ on 11/06/22 at 11:20 AM EDT by a video enabled telemedicine application and verified that I am speaking with the correct person using two identifiers.   I discussed the limitations of evaluation and management by telemedicine and the availability of in person appointments. The patient expressed understanding and agreed to proceed.  I discussed the assessment and treatment plan with the patient. The patient was provided an opportunity to ask questions and all were answered. The patient agreed with the plan and demonstrated an understanding of the instructions.   The patient was advised to call back or seek an in-person evaluation if the symptoms worsen or if the condition fails to improve as anticipated.  I provided 10 minutes of non-face-to-face time during this encounter.  The patient was located at home.  The provider was located at [facility].   Jamie Gibbs, NP   Subjective:   Patient ID:  Jamie Gamble is a 77 y.o. (DOB 1945-07-25) female.  Chief Complaint: No chief complaint on file.   HPI Jamie Gamble presents for follow-up of GAD and MDD.  Seeing therapist - Jamie Gamble   Describes mood today as "not good". Pleasant. Denies tearfulness. Mood symptoms - reports depression and anxiety - "more depressed overall". Denies irritability. Denies panic attacks. Reports worry, rumination, and over thinking. Reports obsessive thoughts - worried about her health. Reports feeling lonely and useless and can't do much about it - would like to get out and about and be around people. Reports going to lunch with a few friends a month ago - but did not enjoy herself. Has been staying home - not leaving the house.Mood is lower. Stating "I'm not feeling any better". Does not feel like current medication regimen is helpful - started Wellbutrin XL 150mg  - 5 days ago.   Uncertain if medications  are helpful, but may need to be adjustment. Varying interest and motivation. Taking medications as prescribed.  Energy levels lower. Active, does no have a regular exercise routine - reports going to the mailbox.  Enjoys some usual interests and activities. Single. Lives alone. No family local.  Appetite decreased - "forcing myself to eat". Weight stable - 170 to 175 pounds. Sleeps better some nights than others. Averages 5 hours - using CPAP nightly. Denies daytime napping. Focus and concentration is "iffy". Completing some tasks. Managing aspects of household. Retired. Denies SI or HI.   Denies AH or VH. Denies self harm. Denies substance use.   Previous ECT treatment.   Review of Systems:  Review of Systems  Musculoskeletal:  Negative for gait problem.  Neurological:  Negative for tremors.  Psychiatric/Behavioral:         Please refer to HPI    Medications: I have reviewed the patient's current medications.  Current Outpatient Medications  Medication Sig Dispense Refill   atorvastatin (LIPITOR) 40 MG tablet TAKE ONE TABLET BY MOUTH AT BEDTIME 90 tablet 0   buPROPion (WELLBUTRIN XL) 150 MG 24 hr tablet Take 1 tablet (150 mg total) by mouth in the morning. 30 tablet 1   FLUoxetine (PROZAC) 20 MG capsule Take 1 capsule (20 mg total) by mouth daily. 90 capsule 1   Glycerin-Hypromellose-PEG 400 (VISINE DRY EYE OP) Place 1 drop into both eyes daily as needed (dry eyes).     hydrochlorothiazide (MICROZIDE) 12.5 MG capsule TAKE ONE CAPSULE BY MOUTH ONCE DAILY 90 capsule 0  hydrOXYzine (ATARAX) 25 MG tablet Take 1 tablet (25 mg total) by mouth at bedtime as needed. 90 tablet 0   losartan (COZAAR) 25 MG tablet TAKE ONE TABLET BY MOUTH DAILY 90 tablet 0   pantoprazole (PROTONIX) 40 MG tablet Take 1 tablet (40 mg total) by mouth daily. 90 tablet 1   Probiotic Product (PROBIOTIC DAILY PO) Take 1 capsule by mouth daily.     QUEtiapine (SEROQUEL) 25 MG tablet Take 1 tablet (25 mg total) by  mouth at bedtime. 30 tablet 0   traZODone (DESYREL) 100 MG tablet Take 1 tablet (100 mg total) by mouth at bedtime. 90 tablet 1   No current facility-administered medications for this visit.    Medication Side Effects: None  Allergies:  Allergies  Allergen Reactions   Nexium [Esomeprazole] Diarrhea    Past Medical History:  Diagnosis Date   Depression    GERD (gastroesophageal reflux disease)    Hyperlipidemia    Hypertension     Family History  Problem Relation Age of Onset   Depression Mother    Cancer Father    Depression Brother     Social History   Socioeconomic History   Marital status: Single    Spouse name: Not on file   Number of children: 0   Years of education: Not on file   Highest education level: Doctorate  Occupational History   Occupation: Retired [employer] Employee  Tobacco Use   Smoking status: Former   Smokeless tobacco: Never  Advertising account planner   Vaping status: Never Used  Substance and Sexual Activity   Alcohol use: Not Currently   Drug use: Never   Sexual activity: Not on file  Other Topics Concern   Not on file  Social History Narrative   Not on file   Social Determinants of Health   Financial Resource Strain: Low Risk  (02/26/2021)   Overall Financial Resource Strain (CARDIA)    Difficulty of Paying Living Expenses: Not hard at all  Food Insecurity: No Food Insecurity (02/26/2021)   Hunger Vital Sign    Worried About Running Out of Food in the Last Year: Never true    Ran Out of Food in the Last Year: Never true  Transportation Needs: No Transportation Needs (02/26/2021)   PRAPARE - Administrator, Civil Service (Medical): No    Lack of Transportation (Non-Medical): No  Physical Activity: Sufficiently Active (02/26/2021)   Exercise Vital Sign    Days of Exercise per Week: 7 days    Minutes of Exercise per Session: 30 min  Stress: No Stress Concern Present (02/26/2021)   [employer] of Occupational Health - Occupational  Stress Questionnaire    Feeling of Stress : Not at all  Social Connections: Unknown (08/06/2021)   Received from [facility], [facility]   Social Network    Social Network: Not on file  Intimate Partner Violence: Unknown (06/28/2021)   Received from [facility], [facility]   HITS    Physically Hurt: Not on file    Insult or Talk Down To: Not on file    Threaten Physical Harm: Not on file    Scream or Curse: Not on file    Past Medical History, Surgical history, Social history, and Family history were reviewed and updated as appropriate.   Please see review of systems for further details on the patient's review from today.   Objective:   Physical Exam:  There were no vitals taken for this visit.  Physical Exam Constitutional:      General: She is not in acute distress. Musculoskeletal:        General: No deformity.  Neurological:     Mental Status: She is alert and oriented to person, place, and time.     Coordination: Coordination normal.  Psychiatric:        Attention and Perception: Attention and perception normal. She does not perceive auditory or visual hallucinations.        Mood and Affect: Affect is not labile, blunt, angry or inappropriate.        Speech: Speech normal.        Behavior: Behavior normal.        Thought Content: Thought content normal. Thought content is not paranoid or delusional. Thought content does not include homicidal or suicidal ideation. Thought content does not include homicidal or suicidal plan.        Cognition and Memory: Cognition and memory normal.        Judgment: Judgment normal.     Comments: Insight intact     Lab Review:     Component Value Date/Time   NA 140 08/13/2021 0905   K 4.6 08/13/2021 0905   CL 104 08/13/2021 0905   CO2 30 08/13/2021 0905   GLUCOSE 100 (H) 08/13/2021 0905   BUN 22 08/13/2021 0905   BUN 15 07/06/2019 0000   CREATININE 0.85 08/13/2021 0905   CALCIUM 9.3 08/13/2021 0905   PROT 6.8  08/13/2021 0905   ALBUMIN 4.0 08/13/2021 0905   AST 19 08/13/2021 0905   ALT 14 08/13/2021 0905   ALKPHOS 48 08/13/2021 0905   BILITOT 0.4 08/13/2021 0905   GFRNONAA >60 06/04/2021 1154       Component Value Date/Time   WBC 5.8 08/13/2021 0905   RBC 4.22 08/13/2021 0905   HGB 13.7 08/13/2021 0905   HCT 40.7 08/13/2021 0905   PLT 303.0 08/13/2021 0905   MCV 96.5 08/13/2021 0905   MCH 31.9 06/04/2021 1154   MCHC 33.7 08/13/2021 0905   RDW 13.8 08/13/2021 0905   LYMPHSABS 0.7 08/13/2021 0905   MONOABS 0.3 08/13/2021 0905   EOSABS 0.2 08/13/2021 0905   BASOSABS 0.1 08/13/2021 0905    No results found for: "POCLITH", "LITHIUM"   No results found for: "PHENYTOIN", "PHENOBARB", "VALPROATE", "CBMZ"   .res Assessment: Plan:   Plan:  Seeing [name] for therapy   PDMP reviewed  Continue:  Prozac 40mg  daily Wellbutrin XL 150mg  every morning - denies seizure history - started 5 days ago - will increase to 300mg  on Monday 08/19  Trazadone 100mg  - take one tablet at bedtime for sleep Seroquel 25mg  at hs  Hydroxyzine 25mg  - 1 at hs for sleep    Melatonin 5mg  at hs  Discussed NAC tablets and Magnesium L-threonate.  Followed by [name] - therapy   RTC 4 weeks   Patient advised to contact office with any questions, adverse effects, or acute worsening in signs and symptoms.  Discussed potential metabolic side effects associated with atypical antipsychotics, as well as potential risk for movement side effects. Advised pt to contact office if movement side effects occur.   Diagnoses and all orders for this visit:  Severe episode of recurrent major depressive disorder, without psychotic features (HCC)     Please see After Visit Summary for patient specific instructions.  Future Appointments  Date Time Provider Department Center  11/25/2022  2:00 PM Robley Fries, PhD CP-CP None    No orders  of the defined types were placed in this encounter.      -------------------------------

## 2022-11-07 DIAGNOSIS — F332 Major depressive disorder, recurrent severe without psychotic features: Secondary | ICD-10-CM | POA: Diagnosis not present

## 2022-11-07 DIAGNOSIS — F411 Generalized anxiety disorder: Secondary | ICD-10-CM | POA: Diagnosis not present

## 2022-11-07 DIAGNOSIS — F32A Depression, unspecified: Secondary | ICD-10-CM | POA: Diagnosis not present

## 2022-11-07 DIAGNOSIS — K219 Gastro-esophageal reflux disease without esophagitis: Secondary | ICD-10-CM | POA: Diagnosis not present

## 2022-11-07 DIAGNOSIS — I10 Essential (primary) hypertension: Secondary | ICD-10-CM | POA: Diagnosis not present

## 2022-11-07 DIAGNOSIS — Z604 Social exclusion and rejection: Secondary | ICD-10-CM | POA: Diagnosis not present

## 2022-11-07 DIAGNOSIS — I890 Lymphedema, not elsewhere classified: Secondary | ICD-10-CM | POA: Diagnosis not present

## 2022-11-07 DIAGNOSIS — E785 Hyperlipidemia, unspecified: Secondary | ICD-10-CM | POA: Diagnosis not present

## 2022-11-07 DIAGNOSIS — Z556 Problems related to health literacy: Secondary | ICD-10-CM | POA: Diagnosis not present

## 2022-11-12 DIAGNOSIS — F332 Major depressive disorder, recurrent severe without psychotic features: Secondary | ICD-10-CM | POA: Diagnosis not present

## 2022-11-12 DIAGNOSIS — F411 Generalized anxiety disorder: Secondary | ICD-10-CM | POA: Diagnosis not present

## 2022-11-12 DIAGNOSIS — I890 Lymphedema, not elsewhere classified: Secondary | ICD-10-CM | POA: Diagnosis not present

## 2022-11-12 DIAGNOSIS — E785 Hyperlipidemia, unspecified: Secondary | ICD-10-CM | POA: Diagnosis not present

## 2022-11-12 DIAGNOSIS — Z556 Problems related to health literacy: Secondary | ICD-10-CM | POA: Diagnosis not present

## 2022-11-12 DIAGNOSIS — I10 Essential (primary) hypertension: Secondary | ICD-10-CM | POA: Diagnosis not present

## 2022-11-12 DIAGNOSIS — F32A Depression, unspecified: Secondary | ICD-10-CM | POA: Diagnosis not present

## 2022-11-12 DIAGNOSIS — K219 Gastro-esophageal reflux disease without esophagitis: Secondary | ICD-10-CM | POA: Diagnosis not present

## 2022-11-12 DIAGNOSIS — Z604 Social exclusion and rejection: Secondary | ICD-10-CM | POA: Diagnosis not present

## 2022-11-13 ENCOUNTER — Other Ambulatory Visit: Payer: Self-pay | Admitting: Internal Medicine

## 2022-11-17 ENCOUNTER — Other Ambulatory Visit: Payer: Self-pay | Admitting: Adult Health

## 2022-11-18 ENCOUNTER — Ambulatory Visit: Payer: Medicare PPO | Admitting: Psychiatry

## 2022-11-18 NOTE — Telephone Encounter (Signed)
Pharmacy lvm stating that Lane is taking 300 mg  a day. Please send in a new script

## 2022-11-20 ENCOUNTER — Telehealth (INDEPENDENT_AMBULATORY_CARE_PROVIDER_SITE_OTHER): Payer: Medicare PPO | Admitting: Adult Health

## 2022-11-20 ENCOUNTER — Encounter: Payer: Self-pay | Admitting: Adult Health

## 2022-11-20 DIAGNOSIS — F411 Generalized anxiety disorder: Secondary | ICD-10-CM | POA: Diagnosis not present

## 2022-11-20 DIAGNOSIS — F332 Major depressive disorder, recurrent severe without psychotic features: Secondary | ICD-10-CM

## 2022-11-20 MED ORDER — ARIPIPRAZOLE 2 MG PO TABS
2.0000 mg | ORAL_TABLET | Freq: Every day | ORAL | 2 refills | Status: DC
Start: 2022-11-20 — End: 2022-12-04

## 2022-11-20 NOTE — Progress Notes (Signed)
Jamie Gamble [id number] 1945-09-14 77 y.o.  Virtual Visit via Video Note  I connected with pt @ on 11/20/22 at 10:20 AM EDT by a video enabled telemedicine application and verified that I am speaking with the correct person using two identifiers.   I discussed the limitations of evaluation and management by telemedicine and the availability of in person appointments. The patient expressed understanding and agreed to proceed.  I discussed the assessment and treatment plan with the patient. The patient was provided an opportunity to ask questions and all were answered. The patient agreed with the plan and demonstrated an understanding of the instructions.   The patient was advised to call back or seek an in-person evaluation if the symptoms worsen or if the condition fails to improve as anticipated.  I provided 15 minutes of non-face-to-face time during this encounter.  The patient was located at home.  The provider was located at [facility].   Jamie Gibbs, NP   Subjective:   Patient ID:  Jamie Gamble is a 77 y.o. (DOB [birth year]) female.  Chief Complaint: No chief complaint on file.   HPI Jamie Gamble presents for follow-up of GAD and MDD.  Seeing therapist - Jamie Gamble   Describes mood today as "about the same". Pleasant. Denies tearfulness. Mood symptoms - reports depression and anxiety. Denies irritability. Denies panic attacks. Reports worry, rumination, and over thinking - health. Reports obsessive thoughts - health. Mood is lower. Stating "I can't seem to get better". Reports taking medications as prescribed, but does not feel like they are helpful - willing to consider other options. Varying interest and motivation. Taking medications as prescribed.  Energy levels lower. Active, does no have a regular exercise routine - reports going to the mailbox.  Enjoys some usual interests and activities. Single. Lives alone. No family local.  Appetite decreased - "I'm  making myself eat". Weight stable - 170 to 175 pounds. Sleeps better some nights than others - waking up throughout the night. Averages 6 hours - using CPAP nightly. Denies daytime napping. Focus and concentration is "fair". Completing some tasks. Managing minimal aspects of household. Retired. Denies SI or HI.   Denies AH or VH. Denies self harm. Denies substance use.   Previous ECT treatment.   Review of Systems:  Review of Systems  Musculoskeletal:  Negative for gait problem.  Neurological:  Negative for tremors.  Psychiatric/Behavioral:         Please refer to HPI    Medications: I have reviewed the patient's current medications.  Current Outpatient Medications  Medication Sig Dispense Refill   ARIPiprazole (ABILIFY) 2 MG tablet Take 1 tablet (2 mg total) by mouth daily. 30 tablet 2   atorvastatin (LIPITOR) 40 MG tablet TAKE ONE TABLET BY MOUTH AT BEDTIME 90 tablet 0   buPROPion (WELLBUTRIN XL) 150 MG 24 hr tablet Take 2 tablets (300 mg total) by mouth daily. 60 tablet 1   FLUoxetine (PROZAC) 20 MG capsule Take 1 capsule (20 mg total) by mouth daily. 90 capsule 1   Glycerin-Hypromellose-PEG 400 (VISINE DRY EYE OP) Place 1 drop into both eyes daily as needed (dry eyes).     hydrochlorothiazide (MICROZIDE) 12.5 MG capsule TAKE ONE CAPSULE BY MOUTH ONCE DAILY 90 capsule 0   hydrOXYzine (ATARAX) 25 MG tablet Take 1 tablet (25 mg total) by mouth at bedtime as needed. 90 tablet 0   losartan (COZAAR) 25 MG tablet TAKE ONE TABLET BY MOUTH DAILY 90 tablet 0  pantoprazole (PROTONIX) 40 MG tablet Take 1 tablet (40 mg total) by mouth daily. 90 tablet 1   Probiotic Product (PROBIOTIC DAILY PO) Take 1 capsule by mouth daily.     QUEtiapine (SEROQUEL) 25 MG tablet Take 1 tablet (25 mg total) by mouth at bedtime. 30 tablet 0   traZODone (DESYREL) 100 MG tablet Take 1 tablet (100 mg total) by mouth at bedtime. 90 tablet 1   No current facility-administered medications for this visit.     Medication Side Effects: None  Allergies:  Allergies  Allergen Reactions   Nexium [Esomeprazole] Diarrhea    Past Medical History:  Diagnosis Date   Depression    GERD (gastroesophageal reflux disease)    Hyperlipidemia    Hypertension     Family History  Problem Relation Age of Onset   Depression Mother    Cancer Father    Depression Brother     Social History   Socioeconomic History   Marital status: Single    Spouse name: Not on file   Number of children: 0   Years of education: Not on file   Highest education level: Doctorate  Occupational History   Occupation: Retired [employer] Employee  Tobacco Use   Smoking status: Former   Smokeless tobacco: Never  Advertising account planner   Vaping status: Never Used  Substance and Sexual Activity   Alcohol use: Not Currently   Drug use: Never   Sexual activity: Not on file  Other Topics Concern   Not on file  Social History Narrative   Not on file   Social Determinants of Health   Financial Resource Strain: Low Risk  (02/26/2021)   Overall Financial Resource Strain (CARDIA)    Difficulty of Paying Living Expenses: Not hard at all  Food Insecurity: No Food Insecurity (02/26/2021)   Hunger Vital Sign    Worried About Running Out of Food in the Last Year: Never true    Ran Out of Food in the Last Year: Never true  Transportation Needs: No Transportation Needs (02/26/2021)   PRAPARE - Administrator, Civil Service (Medical): No    Lack of Transportation (Non-Medical): No  Physical Activity: Sufficiently Active (02/26/2021)   Exercise Vital Sign    Days of Exercise per Week: 7 days    Minutes of Exercise per Session: 30 min  Stress: No Stress Concern Present (02/26/2021)   [employer] of Occupational Health - Occupational Stress Questionnaire    Feeling of Stress : Not at all  Social Connections: Unknown (08/06/2021)   Received from [facility], [facility]   Social Network    Social Network: Not on file   Intimate Partner Violence: Unknown (06/28/2021)   Received from [facility]   HITS    Physically Hurt: Not on file    Insult or Talk Down To: Not on file    Threaten Physical Harm: Not on file    Scream or Curse: Not on file    Past Medical History, Surgical history, Social history, and Family history were reviewed and updated as appropriate.   Please see review of systems for further details on the patient's review from today.   Objective:   Physical Exam:  There were no vitals taken for this visit.  Physical Exam Constitutional:      General: She is not in acute distress. Musculoskeletal:        General: No deformity.  Neurological:     Mental Status: She is alert  and oriented to person, place, and time.     Coordination: Coordination normal.  Psychiatric:        Attention and Perception: Attention and perception normal. She does not perceive auditory or visual hallucinations.        Mood and Affect: Affect is not labile, blunt, angry or inappropriate.        Speech: Speech normal.        Behavior: Behavior normal.        Thought Content: Thought content normal. Thought content is not paranoid or delusional. Thought content does not include homicidal or suicidal ideation. Thought content does not include homicidal or suicidal plan.        Cognition and Memory: Cognition and memory normal.        Judgment: Judgment normal.     Comments: Insight intact     Lab Review:     Component Value Date/Time   NA 140 08/13/2021 0905   K 4.6 08/13/2021 0905   CL 104 08/13/2021 0905   CO2 30 08/13/2021 0905   GLUCOSE 100 (H) 08/13/2021 0905   BUN 22 08/13/2021 0905   BUN 15 07/06/2019 0000   CREATININE 0.85 08/13/2021 0905   CALCIUM 9.3 08/13/2021 0905   PROT 6.8 08/13/2021 0905   ALBUMIN 4.0 08/13/2021 0905   AST 19 08/13/2021 0905   ALT 14 08/13/2021 0905   ALKPHOS 48 08/13/2021 0905   BILITOT 0.4 08/13/2021 0905   GFRNONAA >60 06/04/2021 1154        Component Value Date/Time   WBC 5.8 08/13/2021 0905   RBC 4.22 08/13/2021 0905   HGB 13.7 08/13/2021 0905   HCT 40.7 08/13/2021 0905   PLT 303.0 08/13/2021 0905   MCV 96.5 08/13/2021 0905   MCH 31.9 06/04/2021 1154   MCHC 33.7 08/13/2021 0905   RDW 13.8 08/13/2021 0905   LYMPHSABS 0.7 08/13/2021 0905   MONOABS 0.3 08/13/2021 0905   EOSABS 0.2 08/13/2021 0905   BASOSABS 0.1 08/13/2021 0905    No results found for: "POCLITH", "LITHIUM"   No results found for: "PHENYTOIN", "PHENOBARB", "VALPROATE", "CBMZ"   .res Assessment: Plan:    Plan:  Seeing [name] for therapy   PDMP reviewed  Continue:  Prozac 40mg  daily Trazadone 100mg  - take one tablet at bedtime for sleep Seroquel 25mg  at hs  Hydroxyzine 25mg  - 1 at hs for sleep    Decrease Wellbutrin XL 300mg  every morning to 150mg  every morning - denies seizure history  Add Abilify 2mg  daily  Melatonin 5mg  at hs  Discussed NAC tablets and Magnesium L-threonate.  Followed by [name] - therapy   RTC 2 weeks   Patient advised to contact office with any questions, adverse effects, or acute worsening in signs and symptoms.  Discussed potential metabolic side effects associated with atypical antipsychotics, as well as potential risk for movement side effects. Advised pt to contact office if movement side effects occur.   Diagnoses and all orders for this visit:  Severe episode of recurrent major depressive disorder, without psychotic features (HCC) -     ARIPiprazole (ABILIFY) 2 MG tablet; Take 1 tablet (2 mg total) by mouth daily.  Generalized anxiety disorder     Please see After Visit Summary for patient specific instructions.  Future Appointments  Date Time Provider Department Center  11/25/2022  2:00 PM Robley Fries, PhD CP-CP None  12/04/2022 10:20 AM Damien Cisar, Thereasa Solo, NP CP-[facility] None  01/05/2023 10:00 AM Robley Fries, PhD CP-CP None  No orders of the defined types were placed in  this encounter.     -------------------------------

## 2022-11-25 ENCOUNTER — Ambulatory Visit (INDEPENDENT_AMBULATORY_CARE_PROVIDER_SITE_OTHER): Payer: Medicare PPO | Admitting: Psychiatry

## 2022-11-25 DIAGNOSIS — F411 Generalized anxiety disorder: Secondary | ICD-10-CM

## 2022-11-25 DIAGNOSIS — Z9189 Other specified personal risk factors, not elsewhere classified: Secondary | ICD-10-CM | POA: Diagnosis not present

## 2022-11-25 DIAGNOSIS — F5104 Psychophysiologic insomnia: Secondary | ICD-10-CM | POA: Diagnosis not present

## 2022-11-25 DIAGNOSIS — F32A Depression, unspecified: Secondary | ICD-10-CM | POA: Diagnosis not present

## 2022-11-25 DIAGNOSIS — Z604 Social exclusion and rejection: Secondary | ICD-10-CM | POA: Diagnosis not present

## 2022-11-25 DIAGNOSIS — K219 Gastro-esophageal reflux disease without esophagitis: Secondary | ICD-10-CM | POA: Diagnosis not present

## 2022-11-25 DIAGNOSIS — I890 Lymphedema, not elsewhere classified: Secondary | ICD-10-CM | POA: Diagnosis not present

## 2022-11-25 DIAGNOSIS — Z556 Problems related to health literacy: Secondary | ICD-10-CM | POA: Diagnosis not present

## 2022-11-25 DIAGNOSIS — I10 Essential (primary) hypertension: Secondary | ICD-10-CM | POA: Diagnosis not present

## 2022-11-25 DIAGNOSIS — E785 Hyperlipidemia, unspecified: Secondary | ICD-10-CM | POA: Diagnosis not present

## 2022-11-25 DIAGNOSIS — F332 Major depressive disorder, recurrent severe without psychotic features: Secondary | ICD-10-CM | POA: Diagnosis not present

## 2022-11-25 NOTE — Patient Instructions (Addendum)
Resolved today to  Take NAC regularly in the morning Take Mg L-theonate regularly in the evening Try to make a daily practice of seeing some sunlight Try to get out for something, not just have it all delivered Notice and dispute "I have to, but I can't" thinking and either determine I don't have to after all or I actually can do a little of something the feels hard Notice any difference these make, however small  Recommendation also to use prayer time to tell God "I'll trust you, even if I am tired of not being able to hear you or feel you.  Please help me see when You are working quietly."  Sept 12 got taken, but we have you on the cancellation list.  If no openings come up, I can add time to my schedule if you will make the call to ask.  - Dr Judie Petit

## 2022-11-25 NOTE — Progress Notes (Signed)
Psychotherapy Progress Note [facility] [name], PhD LP  Patient ID: Jamie Gamble [facility])    MRN: [id number] Therapy format: Individual psychotherapy Date: 11/25/2022      Start: 2:20p     Stop: 3:02p     Time Spent: 42 min Location: Telehealth visit -- I connected with this patient by an approved telecommunication method (audio only), with her informed consent, and verifying identity and patient privacy.  I was located at my office and patient at her home.  As needed, we discussed the limitations, risks, and security and privacy concerns associated with telehealth service, including the availability and conditions which currently govern in-person appointments and the possibility that 3rd-party payment may not be fully guaranteed and she may be responsible for charges.  After she indicated understanding, we proceeded with the session.  Also discussed treatment planning, as needed, including ongoing verbal agreement with the plan, the opportunity to ask and answer all questions, her demonstrated understanding of instructions, and her readiness to call the office should symptoms worsen or she feels she is in a crisis state and needs more immediate and tangible assistance.   Session narrative (presenting needs, interim history, self-report of stressors and symptoms, applications of prior therapy, status changes, and interventions made in session) Saw psychiatrist 5 days ago by video, with prior, discussed concerns -- shared by hr one good friend [name] -- for failure to thrive and deferring therapy.  Note indicates reducing Wellbutrin, adding Abilify, recommending NAC and Mg L-theonate, f/u 2 wks, and understanding that she is active in therapy.    2pm video session, not online at appt time.  TC made 2:20p to prompt, says she thought it was Thursday and asked if we could just keep it by phone rather than work video.    Reviewed medication and supplement ideas above, says she has been  irregular with them.  Quotes psychiatry as telling her she can discuss things besides medicines with therapist, i.e., what she can do for depression, but actually, it is the things she can do that we typically discuss, and what we are discussing now -- incl. making habit getting NAC in the morning, making habit to get some time outside her 4 walls most days, pushing back on her shut-in habit of ordering groceries for delivery and not going out, pushing back on the habit of trying to fetch mail when she figures she can avoid running into neighbors, and trying to take seriously the fact that she can assert herself if she feels detained beyond her liking and tell a neighbor she doesn't feel well and would like to get back (believes she can't do that, "it's just not me").  C/o waking repeatedly during the night, with no TV, noise, or discomfort to wake her.  Not alarmed, per se, but involuntarily wakeful.  Using yellow/orange glasses regularly, she says, and still popping awake.  Doesn't think she's napping during the day.  Night routine involves praying to get better, but does not think she is distressing herself further with it.  Some friends try to get in touch, uncomfortable doing so, just finds it "so hard to explain."  Empathized and tried to clarify whether it is an intense sensory hypersensitivity or more the intimidating thought -- a la social anxiety -- that it will be such an uncomfortable experience.  Perhaps a mark of progress that she is thinking about whether she needs ECT, but still quickly rejects it as something else she just couldn't do,  alluding to the terror of anesthesia she has had, both last year during successful treatment and this year considering whether to get it again.  Also claims it "was so slow", when notes from last August-Sept actually show a rapid response, just hight anxiety about treatments, and what took so long was actually months of wrestling with it after she aborted treatment  in the spring.  Supportively confronted exaggerating the problem and protecting her depression.  Supportively confronted that "I have to but I can't" thinking is most likely the most agitating thing that effectively makes her emotional brain too "sore" to cope; if she will let herself off the fence either way (e.g., don't have to entertain neighbors, I can get out to the mail without having to avoid people), her mod and probably dyssomnia will improve for acting on the perspective.  Still ran into quiet periods this conversation, seemingly intimidated, but overall better energy and production in speech than last acquaintance.  Resolved to  Take NAC regularly in the morning Take Mg L-theonate regularly in the evening Try to make a daily practice of seeing some sunlight Try to get out for something, not just have it all delivered Notice and dispute "I have to, but I can't" thinking and either determine I don't have to after all or I actually can do a little of something the feels hard Notice any difference these make, however small  Recommendation also to alter prayer time -- instead of mostly pleading with God to help take away her depression, try telling God "I'll trust you, even if I'm tired of being unable to hear you or feel you.  Please help me see when You are working quietly."  Will need to address insomnia somehow, most likely by targeting anxious thinking, hypnotic worry, and consent to rest and to release demands.  Does seem like waking depression and sleep-related worry are both obsessive conditions, which will need to be braved more than explained or medicated.  ECT remains possibly necessary, and framed that she can avoid it if she will push herself to do the other things mentioned.  Therapeutic modalities: Cognitive Behavioral Therapy, Solution-Oriented/Positive Psychology, and Ego-Supportive  Mental Status/Observations:  Appearance:   Not assessed     Behavior:  demotivated  Motor:   Not assessed  Speech/Language:   Clear and Coherent  Affect:  Not assessed  Mood:  anxious and depressed  Thought process:  ruminative  Thought content:    Rumination  Sensory/Perceptual disturbances:    WNL  Orientation:  Fully oriented  Attention:  Good    Concentration:  Good  Memory:  WNL  Insight:    Fair  Judgment:   Fair  Impulse Control:  Good   Risk Assessment: Danger to Self: No Self-injurious Behavior:  risk of self-neglect Danger to Others: No Physical Aggression / Violence: No Duty to Warn: No Access to Firearms a concern: No  Assessment of progress:   deteriorated, stable  Diagnosis:   ICD-10-CM   1. Severe episode of recurrent major depressive disorder, without psychotic features (HCC)  F33.2     2. Generalized anxiety disorder with social anxiety features  F41.1     3. Psychophysiological insomnia  F51.04     4. At increased risk for social isolation  Z91.89      Plan:  Deterioration plan -- See prior note; psychiatry has next assessment, friend/POA understood to be involving social work and PCP for potential home intervention, as ECT will be resisted, and Pt  does not yet meet criteria for hospitalization.  [name] also will press further for Pt to rise, dress, interact, go out again, and otherwise stimulate normalcy to combat hating summer and obsessing about perceived failures. Sleep regulation -- Should continue orange glasses QPM to facilitate circadian signaling, sleep readiness, and sleep hardiness.  Best recommendation dosing sleep-assisting meds up to 1 hr before bed.  Option add melatonin 2-5mg  if needed 1-2 hr ahead of bed.  Practice sleep hygiene, including comfortable arrangements, lights reduced, and all caffeine stopped well ahead of bedtime (at least 6 hrs).  Use soothing imagery PRN for DFA.  Should adhere to CPAP and ensure proper maintenance.  For worries, option write down worry thoughts and consciously decide whether to stay up and think or lay  them down and sleep.  Acknowledge and table worry about whether she will sleep -- trust the natural drive for sleep to come around, and if too wakeful, get out of bed to do something not too stimulating.  Trust also any uptick in dreams as natural progression of catching up sleep deficits and processing emotional life issues. Physiological mood factors -- Start the day with good lighting, movement/stretching, and a few minutes grounding, outdoors if available/desired.  Vitamin levels are normal, but improved B12 and D could still be helpful.  Should get some moderate exercise as able, even just walking and stretching, to combat depressogenic inactivity effects. Self-esteem and intrusive guilt/shame thoughts -- PRN practices of blessing-counting and listing ways people have told/shown her she is lovable/capable.  No requirement to believe or agree, just consider.  Seek to notice and give self credit when she does anything constructive or principled for things that matter to her wellbeing or sense of purpose.  As needed, self-affirm God would not be punishing her, it's a trick of depression. Activities -- Develop activities of interest.  OK with working, if regains interest.  Self-monitor for broad demotivation.  Options in church activities at 3 locations, and ranging from chair yoga to entertainment/fellowship group to more theological and philosophical study. Financial concerns -- Follow through fact-finding and meeting with advisor, in coordination with friend/POA [name].  Still OK to seek part-time work she can reach from her home.  Maintain cordial working relationship with nephew/trustee Fraser Din. Support system/social involvement -- Maintain/restore system of friends, Equities trader, anyone available at housing community.  Re-engage available friends actively, and dispute worry thoughts of being judged.  Sen[employer] available for supportive calls, assistance services, and possible facility-based  recreation.  Home care companions if needed.  Self-monitor for return to reclusiveness and depression, and in all involvements practice permission to be authentic with one and all. Placement concerns -- Independent living with PRN assistance should be fine for the foreseeable future.  Available on request to interpret her condition to family members and work out concerns. ECT -- On hold, has cognitively recovered for months, and the idea of shock therapy (and anesthesia) is highly anxiety-provoking to her.  Pt understands and accepts the burden to notify treatment team promptly of relapse in dark/dreadful mood, severe demotivation, or intractable insomnia. Medical procedure anxiety -- Ask all necessary questions of health care, make sure to adequately comprehend recommendations rather than catastrophize.  For any procedure accepted, imagine th competence and caring of health care personnel, especially if it involves anesthesia and other vulnerable-making procedures. Weight management -- Concur with modified Weight Watchers approach and improved food choices at her interest.  Make sure to stay mobile, watch portions, limit carbs.  May  observe practice of leaving a bite, work up concentration of foods contained in the MIND Diet. Other recommendations/advice -- As may be noted above.  Continue to utilize previously learned skills ad lib. Medication compliance -- Maintain medication as prescribed and work faithfully with relevant prescriber(s) if any changes are desired or seem indicated. Crisis service -- Aware of call list and work-in appts.  Call the clinic on-call service, 988/hotline, 911, or present to [facility] or ER if any life-threatening psychiatric crisis. Followup -- Return for time as already scheduled, avail earlier @ PT's need, put on CA list.  Next scheduled visit with me 01/05/2023.  Next scheduled in this office 12/04/2022.  Robley Fries, PhD [name], PhD LP Clinical Psychologist, [facility] [address] [place], [place] [address] [phone]

## 2022-11-26 DIAGNOSIS — G4733 Obstructive sleep apnea (adult) (pediatric): Secondary | ICD-10-CM | POA: Diagnosis not present

## 2022-11-30 DIAGNOSIS — G4733 Obstructive sleep apnea (adult) (pediatric): Secondary | ICD-10-CM | POA: Diagnosis not present

## 2022-12-01 ENCOUNTER — Other Ambulatory Visit: Payer: Self-pay | Admitting: Internal Medicine

## 2022-12-04 ENCOUNTER — Telehealth (INDEPENDENT_AMBULATORY_CARE_PROVIDER_SITE_OTHER): Payer: Medicare PPO | Admitting: Adult Health

## 2022-12-04 ENCOUNTER — Telehealth: Payer: Self-pay | Admitting: Adult Health

## 2022-12-04 ENCOUNTER — Encounter: Payer: Self-pay | Admitting: Adult Health

## 2022-12-04 DIAGNOSIS — F332 Major depressive disorder, recurrent severe without psychotic features: Secondary | ICD-10-CM

## 2022-12-04 DIAGNOSIS — F411 Generalized anxiety disorder: Secondary | ICD-10-CM | POA: Diagnosis not present

## 2022-12-04 DIAGNOSIS — F5104 Psychophysiologic insomnia: Secondary | ICD-10-CM

## 2022-12-04 MED ORDER — CARIPRAZINE HCL 1.5 MG PO CAPS
1.5000 mg | ORAL_CAPSULE | Freq: Every day | ORAL | 2 refills | Status: DC
Start: 2022-12-04 — End: 2023-02-11

## 2022-12-04 NOTE — Telephone Encounter (Signed)
Pharmacy sent PA Request for VRAYLAR 1.5mg  ,see CMM

## 2022-12-04 NOTE — Progress Notes (Signed)
Jamie Gamble [id number] March 09, 1946 77 y.o.  Virtual Visit via Video Note  I connected with pt @ on 12/04/22 at 10:20 AM EDT by a video enabled telemedicine application and verified that I am speaking with the correct person using two identifiers.   I discussed the limitations of evaluation and management by telemedicine and the availability of in person appointments. The patient expressed understanding and agreed to proceed.  I discussed the assessment and treatment plan with the patient. The patient was provided an opportunity to ask questions and all were answered. The patient agreed with the plan and demonstrated an understanding of the instructions.   The patient was advised to call back or seek an in-person evaluation if the symptoms worsen or if the condition fails to improve as anticipated.  I provided 10 minutes of non-face-to-face time during this encounter.  The patient was located at home.  The provider was located at [facility].   Dorothyann Gibbs, NP   Subjective:   Patient ID:  Jamie Gamble is a 76 y.o. (DOB [birth year]) female.  Chief Complaint: No chief complaint on file.   HPI [name] presents for follow-up of GAD and MDD.  Seeing therapist - Dr. Farrel Demark   Describes mood today as "no change". Pleasant. Denies tearfulness. Mood symptoms - reports depression and anxiety. Denies irritability. Denies panic attacks. Reports worry, rumination, and over thinking - more related to health issues. Reports obsessive thoughts. Mood is lower. Stating "I don't feel any better - not really".   Reports taking medications as prescribed, but does not feel like they are helpful - willing to consider other options. Varying interest and motivation. Taking medications as prescribed.  Energy levels lower. Active, does no have a regular exercise routine - reports going to the mailbox "occasionally" Enjoys some usual interests and activities. Single. Lives alone. No family  local.  Appetite decreased - "eating regularly". Weight stable - 170 to 175 pounds. Reports sleep is fair. Averages 6 hours on nights she sleeps - using CPAP nightly. Denies daytime napping. Focus and concentration is "fair". Completing some tasks. Managing minimal aspects of household. Retired. Denies SI or HI.   Denies AH or VH. Denies self harm. Denies substance use.   Previous ECT treatment.   Review of Systems:  Review of Systems  Medications: I have reviewed the patient's current medications.  Current Outpatient Medications  Medication Sig Dispense Refill   ARIPiprazole (ABILIFY) 2 MG tablet Take 1 tablet (2 mg total) by mouth daily. 30 tablet 2   atorvastatin (LIPITOR) 40 MG tablet TAKE ONE TABLET BY MOUTH AT BEDTIME 30 tablet 0   buPROPion (WELLBUTRIN XL) 150 MG 24 hr tablet Take 2 tablets (300 mg total) by mouth daily. 60 tablet 1   FLUoxetine (PROZAC) 20 MG capsule Take 1 capsule (20 mg total) by mouth daily. 90 capsule 1   Glycerin-Hypromellose-PEG 400 (VISINE DRY EYE OP) Place 1 drop into both eyes daily as needed (dry eyes).     hydrochlorothiazide (MICROZIDE) 12.5 MG capsule TAKE ONE CAPSULE BY MOUTH ONCE DAILY 90 capsule 0   hydrOXYzine (ATARAX) 25 MG tablet Take 1 tablet (25 mg total) by mouth at bedtime as needed. 90 tablet 0   losartan (COZAAR) 25 MG tablet TAKE ONE TABLET BY MOUTH DAILY 90 tablet 0   pantoprazole (PROTONIX) 40 MG tablet Take 1 tablet (40 mg total) by mouth daily. 90 tablet 1   Probiotic Product (PROBIOTIC DAILY PO) Take 1 capsule by mouth  daily.     QUEtiapine (SEROQUEL) 25 MG tablet Take 1 tablet (25 mg total) by mouth at bedtime. 30 tablet 0   traZODone (DESYREL) 100 MG tablet Take 1 tablet (100 mg total) by mouth at bedtime. 90 tablet 1   No current facility-administered medications for this visit.    Medication Side Effects: None  Allergies:  Allergies  Allergen Reactions   Nexium [Esomeprazole] Diarrhea    Past Medical History:   Diagnosis Date   Depression    GERD (gastroesophageal reflux disease)    Hyperlipidemia    Hypertension     Family History  Problem Relation Age of Onset   Depression Mother    Cancer Father    Depression Brother     Social History   Socioeconomic History   Marital status: Single    Spouse name: Not on file   Number of children: 0   Years of education: Not on file   Highest education level: Doctorate  Occupational History   Occupation: Retired [employer] Employee  Tobacco Use   Smoking status: Former   Smokeless tobacco: Never  Advertising account planner   Vaping status: Never Used  Substance and Sexual Activity   Alcohol use: Not Currently   Drug use: Never   Sexual activity: Not on file  Other Topics Concern   Not on file  Social History Narrative   Not on file   Social Determinants of Health   Financial Resource Strain: Low Risk  (02/26/2021)   Overall Financial Resource Strain (CARDIA)    Difficulty of Paying Living Expenses: Not hard at all  Food Insecurity: No Food Insecurity (02/26/2021)   Hunger Vital Sign    Worried About Running Out of Food in the Last Year: Never true    Ran Out of Food in the Last Year: Never true  Transportation Needs: No Transportation Needs (02/26/2021)   PRAPARE - Administrator, Civil Service (Medical): No    Lack of Transportation (Non-Medical): No  Physical Activity: Sufficiently Active (02/26/2021)   Exercise Vital Sign    Days of Exercise per Week: 7 days    Minutes of Exercise per Session: 30 min  Stress: No Stress Concern Present (02/26/2021)   [employer] of Occupational Health - Occupational Stress Questionnaire    Feeling of Stress : Not at all  Social Connections: Unknown (08/06/2021)   Received from [facility]   Social Network    Social Network: Not on file  Intimate Partner Violence: Unknown (06/28/2021)   Received from [facility], [facility]   HITS    Physically Hurt: Not on file    Insult  or Talk Down To: Not on file    Threaten Physical Harm: Not on file    Scream or Curse: Not on file    Past Medical History, Surgical history, Social history, and Family history were reviewed and updated as appropriate.   Please see review of systems for further details on the patient's review from today.   Objective:   Physical Exam:  There were no vitals taken for this visit.  Physical Exam  Lab Review:     Component Value Date/Time   NA 140 08/13/2021 0905   K 4.6 08/13/2021 0905   CL 104 08/13/2021 0905   CO2 30 08/13/2021 0905   GLUCOSE 100 (H) 08/13/2021 0905   BUN 22 08/13/2021 0905   BUN 15 07/06/2019 0000   CREATININE 0.85 08/13/2021 0905   CALCIUM 9.3 08/13/2021  0905   PROT 6.8 08/13/2021 0905   ALBUMIN 4.0 08/13/2021 0905   AST 19 08/13/2021 0905   ALT 14 08/13/2021 0905   ALKPHOS 48 08/13/2021 0905   BILITOT 0.4 08/13/2021 0905   GFRNONAA >60 06/04/2021 1154       Component Value Date/Time   WBC 5.8 08/13/2021 0905   RBC 4.22 08/13/2021 0905   HGB 13.7 08/13/2021 0905   HCT 40.7 08/13/2021 0905   PLT 303.0 08/13/2021 0905   MCV 96.5 08/13/2021 0905   MCH 31.9 06/04/2021 1154   MCHC 33.7 08/13/2021 0905   RDW 13.8 08/13/2021 0905   LYMPHSABS 0.7 08/13/2021 0905   MONOABS 0.3 08/13/2021 0905   EOSABS 0.2 08/13/2021 0905   BASOSABS 0.1 08/13/2021 0905    No results found for: "POCLITH", "LITHIUM"   No results found for: "PHENYTOIN", "PHENOBARB", "VALPROATE", "CBMZ"   .res Assessment: Plan:    Plan:  Seeing [name] for therapy   PDMP reviewed  Continue:  Prozac 40mg  daily Trazadone 100mg  - take one tablet at bedtime for sleep Seroquel 25mg  at hs  Hydroxyzine 25mg  - 1 at hs for sleep   Wellbutrin XL150mg  every morning - denies seizure history   D/C Abilify 2mg  daily Add Vraylar 1.5mg  daily  Melatonin 5mg  at hs  Discussed NAC tablets and Magnesium L-threonate.  Followed by [name] - therapy   RTC 2 weeks   Patient  advised to contact office with any questions, adverse effects, or acute worsening in signs and symptoms.  Discussed potential metabolic side effects associated with atypical antipsychotics, as well as potential risk for movement side effects. Advised pt to contact office if movement side effects occur.  There are no diagnoses linked to this encounter.   Please see After Visit Summary for patient specific instructions.  Future Appointments  Date Time Provider Department Center  12/04/2022 10:20 AM Kassadi Presswood, Thereasa Solo, NP [facility] None  01/05/2023 10:00 AM Mitchum, Molly Maduro, PhD [facility] None    No orders of the defined types were placed in this encounter.     -------------------------------

## 2022-12-04 NOTE — Telephone Encounter (Signed)
Pt called and said that the the vraylar needs a PA.

## 2022-12-09 ENCOUNTER — Ambulatory Visit: Payer: Medicare PPO | Admitting: Psychiatry

## 2022-12-09 DIAGNOSIS — F40232 Fear of other medical care: Secondary | ICD-10-CM | POA: Diagnosis not present

## 2022-12-09 DIAGNOSIS — F332 Major depressive disorder, recurrent severe without psychotic features: Secondary | ICD-10-CM

## 2022-12-09 DIAGNOSIS — Z9189 Other specified personal risk factors, not elsewhere classified: Secondary | ICD-10-CM

## 2022-12-09 DIAGNOSIS — F411 Generalized anxiety disorder: Secondary | ICD-10-CM

## 2022-12-09 DIAGNOSIS — G4733 Obstructive sleep apnea (adult) (pediatric): Secondary | ICD-10-CM | POA: Diagnosis not present

## 2022-12-09 DIAGNOSIS — F5104 Psychophysiologic insomnia: Secondary | ICD-10-CM | POA: Diagnosis not present

## 2022-12-09 NOTE — Progress Notes (Signed)
Psychotherapy Progress Note [facility] [name], PhD LP  Patient ID: Jamie Gamble [facility])    MRN: [id number] Therapy format: Individual psychotherapy Date: 12/09/2022      Start: 11:21a     Stop: 11:52a     Time Spent: 31 min + record review and consultation time = 45 min Location: Telehealth visit -- I connected with this patient by an approved telecommunication method (video), with her informed consent, and verifying identity and patient privacy.  I was located at my office and patient at her home.  As needed, we discussed the limitations, risks, and security and privacy concerns associated with telehealth service, including the availability and conditions which currently govern in-person appointments and the possibility that 3rd-party payment may not be fully guaranteed and she may be responsible for charges.  After she indicated understanding, we proceeded with the session.  Also discussed treatment planning, as needed, including ongoing verbal agreement with the plan, the opportunity to ask and answer all questions, her demonstrated understanding of instructions, and her readiness to call the office should symptoms worsen or she feels she is in a crisis state and needs more immediate and tangible assistance.   Session narrative (presenting needs, interim history, self-report of stressors and symptoms, applications of prior therapy, status changes, and interventions made in session) Awaiting med change to Abilify and Vraylar, prior auth under way.  Finding herself terrified of ECT.  Barely going out, only to mailbox, since last seen.  Just started poking her head out onto the patio, still trying to avoid people.  [name] has been texting and calling since getting over COVID.  Says she has not noticed any differences, despite regularly taking NAC and Mg.  Says everything is hard getting up, ordering groceries, looking out.  Keeping her eyes open is painful right now, but denies light  sensitivity.  Probed wishes -- feel better, relieve depression.  At the risk of wearing her down with questions -- she is already letting the phone down intermittently and eyes closed most of the time -- queried what she is doing for her own depression, got that she prays for God to take it away but not sure He's listening  Supportively confronted depressive conclusion-jumping about how she "can't" do things, reframed as the feeling but not the fact unless he's actually testing it.  Agreed she would like someone to visit her -- was [name] till she was sick -- but naysays options, saying she doesn't want to ask anyone to make the trip over and climb three flights.  Challenged to consider that if someone offers that, they mean to give the gift, and it's not her place to decide they are burdened when they themselves have said yes.  Discussed who might come over, whether she could enlarge her selection beyond [place], and if she could summon herself to let that happen.  Noted comments in brighter times about friends in [place], and she says that's where he favorite choices would come from after [place].  Identified [name] as the most preferred and framed a challenge to call Jasmine December to thank her for well wishes received and confess that she's been worse off than she let on before and could in fact use a visit if she's willing.  Also noted that [name] and I conferred a few weeks back, and we do feel she has been wasting and would need to galvanize to prevent making ECT necessary again, despite her phobia.    Session called  early due to fatigue and weariness.  Agreed to handle getting her further appointments if sh would follow through on reaching out to the two friends.  Outside of session, further record review and Staff Message to PCP to assess the home health [facility] intervention begun on request some weeks back.  Needs clarification, but available communications suggest the nursing calls she's had may have been  backfiring and she is being regarded as noncompliant with instructions.  Concern that Torianna's interpersonal sensitivities will make task-oriented consulting more alienating and frustrating than long hours to herself, and it may go unrecognized when hr resistance is actually an unconscious test of her audience.  Therapeutic modalities: Cognitive Behavioral Therapy, Solution-Oriented/Positive Psychology, Ego-Supportive, and Faith-sensitive  Mental Status/Observations:  Appearance:   bedclothes      Behavior:  Passive, fatigued  Motor:  sitting  Speech/Language:   Poverty of content, some struggle to put thoughts into words, otherwise fluent  Affect:  Depressed  Mood:  anxious and depressed  Thought process:  blocked  Thought content:    Worry, rumination  Sensory/Perceptual disturbances:    WNL except cataracts  Orientation:  Fully oriented  Attention:  Fair    Concentration:  Fair  Memory:  WNL  Insight:    Good  Judgment:   Variable  Impulse Control:  Good   Risk Assessment: Danger to Self: No Self-injurious Behavior: No Danger to Others: No Physical Aggression / Violence: No Duty to Warn: No Access to Firearms a concern: No  Assessment of progress:   regressing  Diagnosis:   ICD-10-CM   1. Severe episode of recurrent major depressive disorder, without psychotic features (HCC)  F33.2     2. Generalized anxiety disorder with social anxiety features  F41.1     3. Psychophysiological insomnia  F51.04     4. At increased risk for social isolation  Z91.89     5. Fear of other medical care particularly ECT  F40.232     6. OSA on CPAP  G47.33      Plan:  Safety & welfare plan -- Current pledge of no harm, see one or the other mental health provider each week at a minimum until adequate self-care and return to some activity outside the home.  [name] remains POA and cleared to confer.   For now, behavioral agreement Pt pledges to call [name] herself and ask specifically or an  in-person visit; if fails, [place] will call her to confer and advise.  Pt pledges to make call to Toastmaster[employer] friend and ask her for time, broach the subject of coming over. Still resolve to Continue to take NAC regularly in the morning Try Mg L-theonate regularly in the evening Make a daily practice of seeing some sunlight Try to get out for something, not just have it all delivered Allow a neighbor to interact briefly and try saying it if she doesn't want to linger Notice and dispute "I have to, but I can't" thinking -- either determine I don't have to after all, or I actually can do a little something Notice any difference these make, however small Direct prayers more toward asking God's help seeing and raising better things than "taking away" depression Sleep regulation -- Should continue orange glasses QPM to facilitate circadian signaling, sleep readiness, and sleep hardiness.  Best recommendation dosing sleep-assisting meds up to 1 hr before bed.  Option add melatonin 2-5mg  if needed 1-2 hr ahead of bed.  Practice sleep hygiene, including comfortable arrangements, lights reduced, and  all caffeine stopped well ahead of bedtime (at least 6 hrs).  Use soothing imagery PRN for DFA.  Should adhere to CPAP and ensure proper maintenance.  For worries, option write down worry thoughts and consciously decide whether to stay up and think or lay them down and sleep.  Acknowledge and table worry about whether she will sleep -- trust the natural drive for sleep to come around, and if too wakeful, get out of bed to do something not too stimulating.  Trust also any uptick in dreams as natural progression of catching up sleep deficits and processing emotional life issues. Physiological mood factors -- Start the day with good lighting, movement/stretching, and a few minutes grounding, outdoors if available/desired.  Vitamin levels are normal, but improved B12 and D could still be helpful.  Should get some moderate  exercise as able, even just walking and stretching, to combat depressogenic inactivity effects. Self-esteem and intrusive guilt/shame thoughts -- PRN practices of blessing-counting and listing ways people have told/shown her she is lovable/capable.  No requirement to believe or agree, just consider.  Seek to notice and give self credit when she does anything constructive or principled for things that matter to her wellbeing or sense of purpose.  As needed, self-affirm God would not be punishing her, it's a trick of depression. Activities -- Develop activities of interest.  OK with working, if regains interest.  Self-monitor for broad demotivation.  Options in church activities at 3 locations, and ranging from chair yoga to entertainment/fellowship group to more theological and philosophical study. Financial concerns -- Follow through fact-finding and meeting with advisor, in coordination with friend/POA [name].  Still OK to seek part-time work she can reach from her home.  Maintain cordial working relationship with nephew/trustee Fraser Din. Support system/social involvement -- Maintain/restore system of friends, Equities trader, anyone available at housing community.  Re-engage available friends actively, and dispute worry thoughts of being judged.  [employer] available for supportive calls, assistance services, and possible facility-based recreation.  Home care companions if needed.  Self-monitor for return to reclusiveness and depression, and in all involvements practice permission to be authentic with one and all. Placement concerns -- Independent living with PRN assistance should be fine for the foreseeable future.  Available on request to interpret her condition to family members and work out concerns. ECT -- On hold, has cognitively recovered for months, and the idea of shock therapy (and anesthesia) is highly anxiety-provoking to her.  Pt understands and accepts the burden to notify treatment team  promptly of relapse in dark/dreadful mood, severe demotivation, or intractable insomnia. Medical procedure anxiety -- Ask all necessary questions of health care, make sure to adequately comprehend recommendations rather than catastrophize.  For any procedure accepted, imagine th competence and caring of health care personnel, especially if it involves anesthesia and other vulnerable-making procedures. Weight management -- Concur with modified Weight Watchers approach and improved food choices at her interest.  Make sure to stay mobile, watch portions, limit carbs.  May observe practice of leaving a bite, work up concentration of foods contained in the MIND Diet. Other recommendations/advice -- As may be noted above.  Continue to utilize previously learned skills ad lib. Medication compliance -- Maintain medication as prescribed and work faithfully with relevant prescriber(s) if any changes are desired or seem indicated. Crisis service -- Aware of call list and work-in appts.  Call the clinic on-call service, 988/hotline, 911, or present to [facility] or ER if any life-threatening psychiatric crisis. Followup -- Return  in about 1 week (around 12/16/2022) for time as available.  Next scheduled visit with me 01/05/2023.  Next scheduled in this office 12/18/2022.  Robley Fries, PhD [name], PhD LP Clinical Psychologist, [facility] [address] [place], [place] [address] [phone]

## 2022-12-10 ENCOUNTER — Telehealth: Payer: Self-pay | Admitting: Internal Medicine

## 2022-12-10 NOTE — Telephone Encounter (Signed)
Jamie Gamble with [facility] is calling and they was instructed to do SN 1 x wk and the pt is not responding to their instruction with depression . Dois Davenport would like to know if there anything further if not they will discharge pt

## 2022-12-12 NOTE — Progress Notes (Incomplete)
Psychotherapy Progress Note [facility] [name], PhD LP  Patient ID: Jamie Gamble [facility])    MRN: [id number] Therapy format: Individual psychotherapy Date: 12/09/2022      Start: 11:21a     Stop: 11:52a     Time Spent: 31 min + record review and consultation time = 45 min Location: Telehealth visit -- I connected with this patient by an approved telecommunication method (video), with her informed consent, and verifying identity and patient privacy.  I was located at my office and patient at her home.  As needed, we discussed the limitations, risks, and security and privacy concerns associated with telehealth service, including the availability and conditions which currently govern in-person appointments and the possibility that 3rd-party payment may not be fully guaranteed and she may be responsible for charges.  After she indicated understanding, we proceeded with the session.  Also discussed treatment planning, as needed, including ongoing verbal agreement with the plan, the opportunity to ask and answer all questions, her demonstrated understanding of instructions, and her readiness to call the office should symptoms worsen or she feels she is in a crisis state and needs more immediate and tangible assistance.   Session narrative (presenting needs, interim history, self-report of stressors and symptoms, applications of prior therapy, status changes, and interventions made in session) Awaiting med change to Abilify and Vraylar, prior auth under way.  Finding herself terrified of ECT.  Barely going out, only to mailbox, since last seen.  Just started poking her head out onto the patio, still trying to avoid people.  [name] has been texting and calling since getting over COVID.  Says she has not noticed any differences, despite regularly taking NAC and Mg.  Says everything is hard getting up, ordering groceries, looking out.  Keeping her eyes open is painful right now, but denies light  sensitivity.    Probed wishes -- feel better, relieve depression.  At the risk of wearig her down with questions -- she is already letting th phone down intermittently and eyes closed most of the time --   Supportively confronted depressive conclusion-jumping about "can't" do things, ask anyone to make the trip over and climb three flights,   [name]  Therapeutic modalities: {AM:23362::"Cognitive Behavioral Therapy","Solution-Oriented/Positive Psychology"}  Mental Status/Observations:  Appearance:   {PSY:22683}     Behavior:  {PSY:21022743}  Motor:  {PSY:22302}  Speech/Language:   {PSY:22685}  Affect:  {PSY:22687}  Mood:  {PSY:31886}  Thought process:  {QMV:78469}  Thought content:    {PSY:[id number]}  Sensory/Perceptual disturbances:    {PSY:541-884-9694}  Orientation:  {Psych Orientation:23301::"Fully oriented"}  Attention:  {Good-Fair-Poor ratings:23770::"Good"}    Concentration:  {Good-Fair-Poor ratings:23770::"Good"}  Memory:  {PSY:[id number]}  Insight:    {Good-Fair-Poor ratings:23770::"Good"}  Judgment:   {Good-Fair-Poor ratings:23770::"Good"}  Impulse Control:  {Good-Fair-Poor ratings:23770::"Good"}   Risk Assessment: Danger to Self: {Risk:22599::"No"} Self-injurious Behavior: {Risk:22599::"No"} Danger to Others: {Risk:22599::"No"} Physical Aggression / Violence: {Risk:22599::"No"} Duty to Warn: {AMYesNo:22526::"No"} Access to Firearms a concern: {AMYesNo:22526::"No"}  Assessment of progress:  {Progress:22147::"progressing"}  Diagnosis:   ICD-10-CM   1. Severe episode of recurrent major depressive disorder, without psychotic features (HCC)  F33.2     2. Generalized anxiety disorder with social anxiety features  F41.1     3. Psychophysiological insomnia  F51.04     4. At increased risk for social isolation  Z91.89     5. Fear of other medical care particularly ECT  F40.232     6. OSA on CPAP  G47.33  Plan:  Safety & welfare plan -- Current pledge of  no harm, see one or the other mental health provider each week at a minimum until adequate self-care and return to some activity outside the home.  Pt pledges to call [name] herself and ask specifically or an in-person visit; if fails, [place] will call her to confer and advise.  Pt pledges to make call to Toastmaster's friend and ask her for time, broach the subject of coming over. Still resolve to Continue to take NAC regularly in the morning Try Mg L-theonate regularly in the evening Try to make a daily practice of seeing some sunlight Try to get out for something, not just have it all delivered Notice and dispute "I have to, but I can't" thinking and either determine I don't have to after all or I actually can do a little of something the feels hard Notice any difference these make, however small  Other recommendations/advice -- As may be noted above.  Continue to utilize previously learned skills ad lib. Medication compliance -- Maintain medication as prescribed and work faithfully with relevant prescriber(s) if any changes are desired or seem indicated. Crisis service -- Aware of call list and work-in appts.  Call the clinic on-call service, 988/hotline, 911, or present to [facility] or ER if any life-threatening psychiatric crisis. Followup -- Return in about 1 week (around 12/16/2022) for time as available.  Next scheduled visit with me 01/05/2023.  Next scheduled in this office 12/18/2022.  Robley Fries, PhD [name], PhD LP Clinical Psychologist, [facility] [address], [address] [place], [place] [address] [phone]

## 2022-12-15 ENCOUNTER — Telehealth: Payer: Self-pay | Admitting: Adult Health

## 2022-12-15 NOTE — Telephone Encounter (Signed)
9/12 visit patient was told to stop the Abilify and start Vraylar.  Awaiting PA on Vraylar. Pulled 2 weeks of samples of Vraylar and she can stop the Abilify.

## 2022-12-15 NOTE — Telephone Encounter (Signed)
Niasia called and LM at 11:50 to report that she is getting nose bleeds due to the Abilify. Also the PA for the [employer] has gone through it so she can;t get that medication either.  Please call to discuss.  My chart appt 9/26

## 2022-12-16 ENCOUNTER — Other Ambulatory Visit: Payer: Self-pay | Admitting: Adult Health

## 2022-12-18 ENCOUNTER — Telehealth: Payer: Medicare PPO | Admitting: Adult Health

## 2022-12-18 ENCOUNTER — Encounter: Payer: Self-pay | Admitting: Adult Health

## 2022-12-18 DIAGNOSIS — F411 Generalized anxiety disorder: Secondary | ICD-10-CM

## 2022-12-18 DIAGNOSIS — G4700 Insomnia, unspecified: Secondary | ICD-10-CM | POA: Diagnosis not present

## 2022-12-18 DIAGNOSIS — F331 Major depressive disorder, recurrent, moderate: Secondary | ICD-10-CM | POA: Diagnosis not present

## 2022-12-18 MED ORDER — BUPROPION HCL ER (XL) 150 MG PO TB24: 300.0000 mg | ORAL_TABLET | Freq: Every day | ORAL | 2 refills | Status: DC

## 2022-12-18 MED ORDER — FLUOXETINE HCL 20 MG PO CAPS
20.0000 mg | ORAL_CAPSULE | Freq: Every day | ORAL | 1 refills | Status: DC
Start: 2022-12-18 — End: 2023-02-11

## 2022-12-18 MED ORDER — TRAZODONE HCL 100 MG PO TABS
100.0000 mg | ORAL_TABLET | Freq: Every day | ORAL | 1 refills | Status: DC
Start: 2022-12-18 — End: 2023-02-11

## 2022-12-18 MED ORDER — QUETIAPINE FUMARATE 25 MG PO TABS: 25.0000 mg | ORAL_TABLET | Freq: Every day | ORAL | 2 refills | Status: DC

## 2022-12-18 NOTE — Progress Notes (Signed)
Jamie Gamble [id number] 1945-05-16 77 y.o.  Virtual Visit via Video Note  I connected with pt @ on 12/18/22 at 11:20 AM EDT by a video enabled telemedicine application and verified that I am speaking with the correct person using two identifiers.   I discussed the limitations of evaluation and management by telemedicine and the availability of in person appointments. The patient expressed understanding and agreed to proceed.  I discussed the assessment and treatment plan with the patient. The patient was provided an opportunity to ask questions and all were answered. The patient agreed with the plan and demonstrated an understanding of the instructions.   The patient was advised to call back or seek an in-person evaluation if the symptoms worsen or if the condition fails to improve as anticipated.  I provided 10 minutes of non-face-to-face time during this encounter.  The patient was located at home.  The provider was located at [facility].   Dorothyann Gibbs, NP   Subjective:   Patient ID:  Jamie Gamble is a 77 y.o. (DOB Apr 21, 1945) female.  Chief Complaint: No chief complaint on file.   HPI [name] presents for follow-up of GAD and MDD.  Seeing therapist - Dr. Farrel Demark   Describes mood today as "about the same". Pleasant. Denies tearfulness. Mood symptoms - reports depression and anxiety. Denies irritability. Denies panic attacks. Reports worry, rumination, and over thinking - "health issues" Reports some obsessive thoughts. Mood is lower. Stating "I don't feel any better - started on Vraylar 3 days ago". Reports taking medications as prescribed. Varying interest and motivation. Taking medications as prescribed.  Energy levels lower. Active, does not have a regular exercise routine. Walking to the mailbox 2 to 3 times a week. Enjoys some usual interests and activities. Single. Lives alone. No family local.  Appetite decreased - "eating ok". Weight stable - 170 to 175  pounds. Reports sleep as consistent. Averages 5 to 6 hours on nights she sleeps - using CPAP nightly. Denies daytime napping. Focus and concentration is "ok". Completing some tasks. Managing minimal aspects of household. Retired. Denies SI or HI.   Denies AH or VH. Denies self harm. Denies substance use.   Previous ECT treatment.   Review of Systems:  Review of Systems  Musculoskeletal:  Negative for gait problem.  Neurological:  Negative for tremors.  Psychiatric/Behavioral:         Please refer to HPI    Medications: I have reviewed the patient's current medications.  Current Outpatient Medications  Medication Sig Dispense Refill   atorvastatin (LIPITOR) 40 MG tablet TAKE ONE TABLET BY MOUTH AT BEDTIME 30 tablet 0   buPROPion (WELLBUTRIN XL) 150 MG 24 hr tablet Take 2 tablets (300 mg total) by mouth daily. 60 tablet 2   cariprazine (VRAYLAR) 1.5 MG capsule Take 1 capsule (1.5 mg total) by mouth daily. 30 capsule 2   FLUoxetine (PROZAC) 20 MG capsule Take 1 capsule (20 mg total) by mouth daily. 90 capsule 1   Glycerin-Hypromellose-PEG 400 (VISINE DRY EYE OP) Place 1 drop into both eyes daily as needed (dry eyes).     hydrochlorothiazide (MICROZIDE) 12.5 MG capsule TAKE ONE CAPSULE BY MOUTH ONCE DAILY 90 capsule 0   hydrOXYzine (ATARAX) 25 MG tablet Take 1 tablet (25 mg total) by mouth at bedtime as needed. 90 tablet 0   losartan (COZAAR) 25 MG tablet TAKE ONE TABLET BY MOUTH DAILY 90 tablet 0   pantoprazole (PROTONIX) 40 MG tablet Take 1 tablet (  40 mg total) by mouth daily. 90 tablet 1   Probiotic Product (PROBIOTIC DAILY PO) Take 1 capsule by mouth daily.     QUEtiapine (SEROQUEL) 25 MG tablet Take 1 tablet (25 mg total) by mouth at bedtime. 30 tablet 2   traZODone (DESYREL) 100 MG tablet Take 1 tablet (100 mg total) by mouth at bedtime. 90 tablet 1   No current facility-administered medications for this visit.    Medication Side Effects: None  Allergies:  Allergies   Allergen Reactions   Nexium [Esomeprazole] Diarrhea    Past Medical History:  Diagnosis Date   Depression    GERD (gastroesophageal reflux disease)    Hyperlipidemia    Hypertension     Family History  Problem Relation Age of Onset   Depression Mother    Cancer Father    Depression Brother     Social History   Socioeconomic History   Marital status: Single    Spouse name: Not on file   Number of children: 0   Years of education: Not on file   Highest education level: Doctorate  Occupational History   Occupation: Retired [employer] Employee  Tobacco Use   Smoking status: Former   Smokeless tobacco: Never  Advertising account planner   Vaping status: Never Used  Substance and Sexual Activity   Alcohol use: Not Currently   Drug use: Never   Sexual activity: Not on file  Other Topics Concern   Not on file  Social History Narrative   Not on file   Social Determinants of Health   Financial Resource Strain: Low Risk  (02/26/2021)   Overall Financial Resource Strain (CARDIA)    Difficulty of Paying Living Expenses: Not hard at all  Food Insecurity: No Food Insecurity (02/26/2021)   Hunger Vital Sign    Worried About Running Out of Food in the Last Year: Never true    Ran Out of Food in the Last Year: Never true  Transportation Needs: No Transportation Needs (02/26/2021)   PRAPARE - Administrator, Civil Service (Medical): No    Lack of Transportation (Non-Medical): No  Physical Activity: Sufficiently Active (02/26/2021)   Exercise Vital Sign    Days of Exercise per Week: 7 days    Minutes of Exercise per Session: 30 min  Stress: No Stress Concern Present (02/26/2021)   [employer] of Occupational Health - Occupational Stress Questionnaire    Feeling of Stress : Not at all  Social Connections: Unknown (08/06/2021)   Received from [facility]   Social Network    Social Network: Not on file  Intimate Partner Violence: Unknown (06/28/2021)   Received  from [facility], [facility]   HITS    Physically Hurt: Not on file    Insult or Talk Down To: Not on file    Threaten Physical Harm: Not on file    Scream or Curse: Not on file    Past Medical History, Surgical history, Social history, and Family history were reviewed and updated as appropriate.   Please see review of systems for further details on the patient's review from today.   Objective:   Physical Exam:  There were no vitals taken for this visit.  Physical Exam Constitutional:      General: She is not in acute distress. Musculoskeletal:        General: No deformity.  Neurological:     Mental Status: She is alert and oriented to person, place, and time.  Coordination: Coordination normal.  Psychiatric:        Attention and Perception: Attention and perception normal. She does not perceive auditory or visual hallucinations.        Mood and Affect: Mood normal. Mood is not anxious or depressed. Affect is not labile, blunt, angry or inappropriate.        Speech: Speech normal.        Behavior: Behavior normal.        Thought Content: Thought content normal. Thought content is not paranoid or delusional. Thought content does not include homicidal or suicidal ideation. Thought content does not include homicidal or suicidal plan.        Cognition and Memory: Cognition and memory normal.        Judgment: Judgment normal.     Comments: Insight intact     Lab Review:     Component Value Date/Time   NA 140 08/13/2021 0905   K 4.6 08/13/2021 0905   CL 104 08/13/2021 0905   CO2 30 08/13/2021 0905   GLUCOSE 100 (H) 08/13/2021 0905   BUN 22 08/13/2021 0905   BUN 15 07/06/2019 0000   CREATININE 0.85 08/13/2021 0905   CALCIUM 9.3 08/13/2021 0905   PROT 6.8 08/13/2021 0905   ALBUMIN 4.0 08/13/2021 0905   AST 19 08/13/2021 0905   ALT 14 08/13/2021 0905   ALKPHOS 48 08/13/2021 0905   BILITOT 0.4 08/13/2021 0905   GFRNONAA >60 06/04/2021 1154       Component  Value Date/Time   WBC 5.8 08/13/2021 0905   RBC 4.22 08/13/2021 0905   HGB 13.7 08/13/2021 0905   HCT 40.7 08/13/2021 0905   PLT 303.0 08/13/2021 0905   MCV 96.5 08/13/2021 0905   MCH 31.9 06/04/2021 1154   MCHC 33.7 08/13/2021 0905   RDW 13.8 08/13/2021 0905   LYMPHSABS 0.7 08/13/2021 0905   MONOABS 0.3 08/13/2021 0905   EOSABS 0.2 08/13/2021 0905   BASOSABS 0.1 08/13/2021 0905    No results found for: "POCLITH", "LITHIUM"   No results found for: "PHENYTOIN", "PHENOBARB", "VALPROATE", "CBMZ"   .res Assessment: Plan:    Plan:  Seeing [name] for therapy   PDMP reviewed  Continue:  Prozac 40mg  daily Trazadone 100mg  - take one tablet at bedtime for sleep Seroquel 25mg  at hs  Hydroxyzine 25mg  - 1 at hs for sleep   Wellbutrin XL150mg  every morning - denies seizure history   D/C Abilify 2mg  daily Add Vraylar 1.5mg  daily  Melatonin 5mg  at hs  Discussed NAC tablets and Magnesium L-threonate.  Consider Spravato  Followed by [name] - therapy   RTC 2 weeks   Patient advised to contact office with any questions, adverse effects, or acute worsening in signs and symptoms.  Discussed potential metabolic side effects associated with atypical antipsychotics, as well as potential risk for movement side effects. Advised pt to contact office if movement side effects occur.   Diagnoses and all orders for this visit:  Major depressive disorder, recurrent episode, moderate (HCC) -     FLUoxetine (PROZAC) 20 MG capsule; Take 1 capsule (20 mg total) by mouth daily.  Generalized anxiety disorder -     FLUoxetine (PROZAC) 20 MG capsule; Take 1 capsule (20 mg total) by mouth daily.  Insomnia, unspecified type -     traZODone (DESYREL) 100 MG tablet; Take 1 tablet (100 mg total) by mouth at bedtime.  Other orders -     buPROPion (WELLBUTRIN XL) 150 MG 24 hr tablet;  Take 2 tablets (300 mg total) by mouth daily. -     QUEtiapine (SEROQUEL) 25 MG tablet; Take 1 tablet  (25 mg total) by mouth at bedtime.     Please see After Visit Summary for patient specific instructions.  Future Appointments  Date Time Provider Department Center  01/01/2023 11:00 AM Terressa Koyanagi, DO [facility]  01/02/2023 10:20 AM Jalah Warmuth, Thereasa Solo, NP [facility] None  01/05/2023 10:00 AM Robley Fries, PhD [facility] None  02/10/2023 11:00 AM Robley Fries, PhD [facility] None  02/24/2023 10:00 AM Robley Fries, PhD [facility] None    No orders of the defined types were placed in this encounter.     -------------------------------

## 2022-12-19 DIAGNOSIS — F32A Depression, unspecified: Secondary | ICD-10-CM | POA: Diagnosis not present

## 2022-12-19 DIAGNOSIS — Z604 Social exclusion and rejection: Secondary | ICD-10-CM | POA: Diagnosis not present

## 2022-12-19 DIAGNOSIS — I890 Lymphedema, not elsewhere classified: Secondary | ICD-10-CM | POA: Diagnosis not present

## 2022-12-19 DIAGNOSIS — F332 Major depressive disorder, recurrent severe without psychotic features: Secondary | ICD-10-CM | POA: Diagnosis not present

## 2022-12-19 DIAGNOSIS — I10 Essential (primary) hypertension: Secondary | ICD-10-CM | POA: Diagnosis not present

## 2022-12-19 DIAGNOSIS — K219 Gastro-esophageal reflux disease without esophagitis: Secondary | ICD-10-CM | POA: Diagnosis not present

## 2022-12-19 DIAGNOSIS — F411 Generalized anxiety disorder: Secondary | ICD-10-CM | POA: Diagnosis not present

## 2022-12-19 DIAGNOSIS — Z556 Problems related to health literacy: Secondary | ICD-10-CM | POA: Diagnosis not present

## 2022-12-19 DIAGNOSIS — E785 Hyperlipidemia, unspecified: Secondary | ICD-10-CM | POA: Diagnosis not present

## 2022-12-22 DIAGNOSIS — G4733 Obstructive sleep apnea (adult) (pediatric): Secondary | ICD-10-CM | POA: Diagnosis not present

## 2022-12-25 ENCOUNTER — Ambulatory Visit (INDEPENDENT_AMBULATORY_CARE_PROVIDER_SITE_OTHER): Payer: Medicare PPO | Admitting: Psychiatry

## 2022-12-25 DIAGNOSIS — F411 Generalized anxiety disorder: Secondary | ICD-10-CM

## 2022-12-25 DIAGNOSIS — F5104 Psychophysiologic insomnia: Secondary | ICD-10-CM | POA: Diagnosis not present

## 2022-12-25 DIAGNOSIS — F331 Major depressive disorder, recurrent, moderate: Secondary | ICD-10-CM

## 2022-12-25 NOTE — Progress Notes (Signed)
Psychotherapy Progress Note [facility] [name], PhD LP  Patient ID: Jamie Gamble [facility])    MRN: [id number] Therapy format: Individual psychotherapy Date: 12/25/2022      Start: 3:14p     Stop: 3:59p     Time Spent: 45 min (including added chart review and case consultation) Location: In-person   Session narrative (presenting needs, interim history, self-report of stressors and symptoms, applications of prior therapy, status changes, and interventions made in session) Trying to work with Wellbutrin and Wellsite geologist, give them a chance.  Getting out of the 4 walls more.  Has been to [facility] a couple times, [employer], vaccination, out with [name] to eat once.  Got to hairdresser.  Will be going to a [employer].  Clearly more verbal today, at least 3x as productive and spontaneous, and able to laugh, unlike last meeting.  Subjectively, still feels daily struggles with sleep and mood, but able to accept that, on appearances, she's gone from about 15% to 40%.  PHQ-9 administered, with score 14 (high Moderate), but she acknowledges the intensity of negative feelings is less, even if same chronicity.  Affirmed and encouraged.  Can still feel sensitive about the possibility of being uncomfortably detained by people, but is not managing her outings to the mailbox like she was.  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  Is maintaining nutrition, sleep, laundry, ADLs, no SI.  Recognizing how she could take things personally but doesn't have to, like finding an Electrical engineer not wanting to fool with her account -- she is not deciding she's just an insignificant customer, just inclined to leave her money where it is .  Affirmed authority over her perspective and exercising her right to interpret not just on how things feel, though she does want to argue how depression feels that way, it's that dark when it's that dark.  Supportively confronted that on of the goals of depression recovery  and relapse prevention is to make her decisions and interpretations less dependent on how things feel at the moment, while allowing that negative under more intense negative emotions.    Challenged again to get comfortable with verbal tools for managing social exposure, like just saying she needs to move along, so she can be less reliant on just avoiding people, and less beholden to overly stringent ideas of courtesy.  Therapeutic modalities: Cognitive Behavioral Therapy, Solution-Oriented/Positive Psychology, and Ego-Supportive  Mental Status/Observations:  Appearance:   Casual     Behavior:  Appropriate  Motor:  Normal  Speech/Language:   Clear and Coherent and fuller, more adequate  Affect:  Appropriate  Mood:  anxious, depressed, and substantially better  Thought process:  normal  Thought content:    WNL and some negative thinking  Sensory/Perceptual disturbances:    WNL  Orientation:  Fully oriented  Attention:  Good    Concentration:  Good  Memory:  WNL  Insight:    Good  Judgment:   Good  Impulse Control:  Good   Risk Assessment: Danger to Self: No Self-injurious Behavior: No Danger to Others: No Physical Aggression / Violence: No Duty to Warn: No Access to Firearms a concern: No  Assessment of progress:  progressing  Diagnosis:   ICD-10-CM   1. Major depressive disorder, recurrent episode, moderate (HCC)  F33.1     2. Generalized anxiety disorder  F41.1     3. Psychophysiological insomnia  F51.04      Plan:  Safety & welfare plan -- Continue pledge of  no harm, see one or the other mental health provider at least 2/month until returning to normal activity outside the home.  [name] remains POA and cleared to confer. Continue behavioral/social activation agreement to be in touch with [name], contact Toastmaster's friends, and entertain asking someone to come over. Still resolve to Continue to take NAC regularly in the morning Try Mg L-theonate regularly in the  evening Make a daily practice of seeing some sunlight Try to get out for something, not just have it all delivered Allow a neighbor to interact briefly and try saying it if she doesn't want to linger Notice and dispute "I have to, but I can't" thinking -- either determine I don't have to after all, or I actually can do a little something Notice any difference these make, however small Direct prayers more toward asking God's help seeing and raising better things than "taking away" depression Sleep regulation -- Should continue orange glasses QPM to facilitate circadian signaling, sleep readiness, and sleep hardiness.  Best recommendation dosing sleep-assisting meds up to 1 hr before bed.  Option add melatonin 2-5mg  if needed 1-2 hr ahead of bed.  Practice sleep hygiene, including comfortable arrangements, lights reduced, and all caffeine stopped well ahead of bedtime (at least 6 hrs).  Use soothing imagery PRN for DFA.  Should adhere to CPAP and ensure proper maintenance.  For worries, option write down worry thoughts and consciously decide whether to stay up and think or lay them down and sleep.  Acknowledge and table worry about whether she will sleep -- trust the natural drive for sleep to come around, and if too wakeful, get out of bed to do something not too stimulating.  Trust also any uptick in dreams as natural progression of catching up sleep deficits and processing emotional life issues. Physiological mood factors -- Start the day with good lighting, movement/stretching, and a few minutes grounding, outdoors if available/desired.  Vitamin levels are normal, but improved B12 and D could still be helpful.  Should get some moderate exercise as able, even just walking and stretching, to combat depressogenic inactivity effects. Self-esteem and intrusive guilt/shame thoughts -- PRN practices of blessing-counting and listing ways people have told/shown her she is lovable/capable.  No requirement to  believe or agree, just consider.  Seek to notice and give self credit when she does anything constructive or principled for things that matter to her wellbeing or sense of purpose.  As needed, self-affirm God would not be punishing her, it's a trick of depression. Activities -- Develop activities of interest.  OK with working, if regains interest.  Self-monitor for broad demotivation.  Options in church activities at 3 locations, and ranging from chair yoga to entertainment/fellowship group to more theological and philosophical study. Financial concerns -- Follow through fact-finding and meeting with advisor, in coordination with friend/POA [name].  Still OK to seek part-time work she can reach from her home.  Maintain cordial working relationship with nephew/trustee Fraser Din. Support system/social involvement -- Maintain/restore system of friends, Equities trader, anyone available at housing community.  Re-engage available friends actively, and dispute worry thoughts of being judged.  Sen[employer] available for supportive calls, assistance services, and possible facility-based recreation.  Home care companions if needed.  Self-monitor for return to reclusiveness and depression, and in all involvements practice permission to be authentic with one and all. Placement concerns -- Independent living with PRN assistance should be fine for the foreseeable future.  Available on request to interpret her condition to family members  and work out concerns. ECT -- On hold, has cognitively recovered for months, and the idea of shock therapy (and anesthesia) is highly anxiety-provoking to her.  Pt understands and accepts the burden to notify treatment team promptly of relapse in dark/dreadful mood, severe demotivation, or intractable insomnia. Medical procedure anxiety -- Ask all necessary questions of health care, make sure to adequately comprehend recommendations rather than catastrophize.  For any procedure accepted,  imagine th competence and caring of health care personnel, especially if it involves anesthesia and other vulnerable-making procedures. Weight management -- Concur with modified Weight Watchers approach and improved food choices at her interest.  Make sure to stay mobile, watch portions, limit carbs.  May observe practice of leaving a bite, work up concentration of foods contained in the MIND Diet. Other recommendations/advice -- As may be noted above.  Continue to utilize previously learned skills ad lib. Medication compliance -- Maintain medication as prescribed and work faithfully with relevant prescriber(s) if any changes are desired or seem indicated. Crisis service -- Aware of call list and work-in appts.  Call the clinic on-call service, 988/hotline, 911, or present to [facility] or ER if any life-threatening psychiatric crisis. Followup -- Return for time as already scheduled.  Next scheduled visit with me 01/05/2023.  Next scheduled in this office 01/02/2023.  Robley Fries, PhD [name], PhD LP Clinical Psychologist, [facility] [address], [address] [place], [place] [address] [phone]

## 2022-12-28 NOTE — Telephone Encounter (Signed)
Approved Effective:  Until 03/24/23

## 2022-12-30 DIAGNOSIS — G4733 Obstructive sleep apnea (adult) (pediatric): Secondary | ICD-10-CM | POA: Diagnosis not present

## 2023-01-01 ENCOUNTER — Other Ambulatory Visit: Payer: Self-pay | Admitting: Internal Medicine

## 2023-01-01 ENCOUNTER — Ambulatory Visit: Payer: Medicare PPO | Admitting: Family Medicine

## 2023-01-01 DIAGNOSIS — Z0000 Encounter for general adult medical examination without abnormal findings: Secondary | ICD-10-CM | POA: Diagnosis not present

## 2023-01-01 NOTE — Progress Notes (Signed)
PATIENT CHECK-IN and HEALTH RISK ASSESSMENT QUESTIONNAIRE:  -completed by phone/video for upcoming Medicare Preventive Visit  Pre-Visit Check-in: 1)Vitals (height, wt, BP, etc) - record in vitals section for visit on day of visit Request home vitals (wt, BP, etc.) and enter into vitals, THEN update Vital Signs SmartPhrase below at the top of the HPI. See below.  2)Review and Update Medications, Allergies PMH, Surgeries, Social history in Epic 3)Hospitalizations in the last year with date/reason?  none  4)Review and Update Care Team (patient's specialists) in Epic 5) Complete PHQ9 in Epic  6) Complete Fall Screening in Epic 7)Review all Health Maintenance Due and order under PCP if not done.  8)Medicare Wellness Questionnaire: Answer theses question about your habits: Do you drink alcohol? no Have you ever smoked? Smoked a long time - light How many packs a day do/did you smoke? minimal Do you use smokeless tobacco?no Do you use an illicit drugs?no Do you exercises?  no  Feels her diet is fair, eating regular meals, drinks water  Answer theses question about you: Can you perform most household chores? yes Do you find it hard to follow a conversation in a noisy room? no Do you often ask people to speak up or repeat themselves?no Do you feel that you have a problem with memory? no Do you balance your checkbook and or bank acounts? yes Do you feel safe at home? yes Last dentist visit? Sees on a regular basis Do you need assistance with any of the following: Please note if so - mostly independent - doe snot drive  Driving? Does not drive  Feeding yourself?  Getting from bed to chair?  Getting to the toilet?  Bathing or showering?  Dressing yourself?  Managing money?  Climbing a flight of stairs  Preparing meals?  Do you have Advanced Directives in place (Living Will, Healthcare Power or Attorney)?  HCPOA   Last eye Exam and location? See on a regular basis   Do you  currently use prescribed or non-prescribed narcotic or opioid pain medications? no     ----------------------------------------------------------------------------------------------------------------------------------------------------------------------------------------------------------------------  Because this visit was a virtual/telehealth visit, some criteria may be missing or patient reported. Any vitals not documented were not able to be obtained and vitals that have been documented are patient reported.    MEDICARE ANNUAL PREVENTIVE VISIT WITH PROVIDER: (Welcome to Medicare, initial annual wellness or annual wellness exam)  Virtual Visit via Video Note  I connected with [name] on 01/01/23 by a video enabled telemedicine application and verified that I am speaking with the correct person using two identifiers.  Location patient: home Location provider:work or home office Persons participating in the virtual visit: patient, provider  Concerns and/or follow up today: seeing psychiatrist and counselor for depression - has close follow up on this and seeing psych tomorrow so declined screening today. Feels safe.    See HM section in Epic for other details of completed HM.    ROS: negative for report of fevers, unintentional weight loss, vision changes, vision loss, hearing loss or change, chest pain, sob, hemoptysis, melena, hematochezia, hematuria, falls, bleeding or bruising, memory loss  Patient-completed extensive health risk assessment - reviewed and discussed with the patient: See Health Risk Assessment completed with patient prior to the visit either above or in recent phone note. This was reviewed in detailed with the patient today and appropriate recommendations, orders and referrals were placed as needed per Summary below and patient instructions.   Review of Medical History: -PMH,  PSH, Family History and current specialty and care providers reviewed and updated  and listed below   Patient Care Team: Philip Aspen, Limmie Patricia, MD as PCP - General (Internal Medicine)   Past Medical History:  Diagnosis Date   Depression    GERD (gastroesophageal reflux disease)    Hyperlipidemia    Hypertension     Past Surgical History:  Procedure Laterality Date   TONSILLECTOMY      Social History   Socioeconomic History   Marital status: Single    Spouse name: Not on file   Number of children: 0   Years of education: Not on file   Highest education level: Doctorate  Occupational History   Occupation: Retired [employer] Employee  Tobacco Use   Smoking status: Former   Smokeless tobacco: Never  Advertising account planner   Vaping status: Never Used  Substance and Sexual Activity   Alcohol use: Not Currently   Drug use: Never   Sexual activity: Not on file  Other Topics Concern   Not on file  Social History Narrative   Not on file   Social Determinants of Health   Financial Resource Strain: Low Risk  (02/26/2021)   Overall Financial Resource Strain (CARDIA)    Difficulty of Paying Living Expenses: Not hard at all  Food Insecurity: No Food Insecurity (02/26/2021)   Hunger Vital Sign    Worried About Running Out of Food in the Last Year: Never true    Ran Out of Food in the Last Year: Never true  Transportation Needs: No Transportation Needs (02/26/2021)   PRAPARE - Administrator, Civil Service (Medical): No    Lack of Transportation (Non-Medical): No  Physical Activity: Sufficiently Active (02/26/2021)   Exercise Vital Sign    Days of Exercise per Week: 7 days    Minutes of Exercise per Session: 30 min  Stress: No Stress Concern Present (02/26/2021)   [employer] of Occupational Health - Occupational Stress Questionnaire    Feeling of Stress : Not at all  Social Connections: Unknown (08/06/2021)   Received from [facility]   Social Network    Social Network: Not on file  Intimate Partner Violence: Unknown (06/28/2021)    Received from [facility]   HITS    Physically Hurt: Not on file    Insult or Talk Down To: Not on file    Threaten Physical Harm: Not on file    Scream or Curse: Not on file    Family History  Problem Relation Age of Onset   Depression Mother    Cancer Father    Depression Brother     Current Outpatient Medications on File Prior to Visit  Medication Sig Dispense Refill   atorvastatin (LIPITOR) 40 MG tablet TAKE ONE TABLET BY MOUTH AT BEDTIME 30 tablet 0   buPROPion (WELLBUTRIN XL) 150 MG 24 hr tablet Take 2 tablets (300 mg total) by mouth daily. 60 tablet 2   cariprazine (VRAYLAR) 1.5 MG capsule Take 1 capsule (1.5 mg total) by mouth daily. 30 capsule 2   FLUoxetine (PROZAC) 20 MG capsule Take 1 capsule (20 mg total) by mouth daily. 90 capsule 1   Glycerin-Hypromellose-PEG 400 (VISINE DRY EYE OP) Place 1 drop into both eyes daily as needed (dry eyes).     hydrochlorothiazide (MICROZIDE) 12.5 MG capsule TAKE ONE CAPSULE BY MOUTH ONCE DAILY 90 capsule 0   hydrOXYzine (ATARAX) 25 MG tablet Take 1 tablet (25 mg total)  by mouth at bedtime as needed. 90 tablet 0   losartan (COZAAR) 25 MG tablet TAKE ONE TABLET BY MOUTH DAILY 90 tablet 0   pantoprazole (PROTONIX) 40 MG tablet Take 1 tablet (40 mg total) by mouth daily. 90 tablet 1   Probiotic Product (PROBIOTIC DAILY PO) Take 1 capsule by mouth daily.     QUEtiapine (SEROQUEL) 25 MG tablet Take 1 tablet (25 mg total) by mouth at bedtime. 30 tablet 2   traZODone (DESYREL) 100 MG tablet Take 1 tablet (100 mg total) by mouth at bedtime. 90 tablet 1   No current facility-administered medications on file prior to visit.    Allergies  Allergen Reactions   Nexium [Esomeprazole] Diarrhea       Physical Exam Vitals requested from patient and listed below if patient had equipment and was able to obtain at home for this virtual visit: There were no vitals filed for this visit. Estimated body mass index is 30.06 kg/m as  calculated from the following:   Height as of 10/03/21: 5' 2.5" (1.588 m).   Weight as of 02/11/22: 167 lb (75.8 kg).  EKG (optional): deferred due to virtual visit  GENERAL: alert, oriented, no acute distress detected, full vision exam deferred due to pandemic and/or virtual encounter  HEENT: atraumatic, conjunttiva clear, no obvious abnormalities on inspection of external nose and ears  NECK: normal movements of the head and neck  LUNGS: on inspection no signs of respiratory distress, breathing rate appears normal, no obvious gross SOB, gasping or wheezing  CV: no obvious cyanosis  MS: moves all visible extremities without noticeable abnormality  PSYCH/NEURO: pleasant and cooperative, flat affect  Flowsheet Row Video Visit from [facility]/11/2022 [facility]  PHQ-9 Total Score 4           12/25/2022    3:41 PM 02/11/2022   11:17 AM 11/28/2021    2:23 PM 08/13/2021    8:31 AM 04/04/2021   12:58 PM  Depression screen PHQ 2/9  Decreased Interest  0  0 0  Down, Depressed, Hopeless  0  0 0  PHQ - 2 Score  0  0 0  Altered sleeping  2  0   Tired, decreased energy  2     Change in appetite  0  0   Feeling bad or failure about yourself   0  0   Trouble concentrating  0  0   Moving slowly or fidgety/restless  0  0   Suicidal thoughts  0  0   PHQ-9 Score  4  0   Difficult doing work/chores  Not difficult at all  --      Information is confidential and restricted. Go to Review Flowsheets to unlock data.  She declined depression screening today and reports is seeing her Psychiatrist tomorrow for this.      06/04/2021   11:10 AM 08/13/2021    8:32 AM 02/11/2022   11:17 AM 12/28/2022    9:13 AM 01/01/2023   10:58 AM  Fall Risk  Falls in the past year?  0 0 0 0  Was there an injury with Fall?  0 0 0 0  Fall Risk Category Calculator  0 0 0 0  Fall Risk Category (Retired)  Low Low    (RETIRED) Patient Fall Risk Level Low fall risk Low fall risk Low  fall risk    Patient at Risk for Falls Due to  No Fall Risks No Fall Risks  Other (Comment)  Fall risk Follow up  Falls evaluation completed Falls evaluation completed       SUMMARY AND PLAN:  Encounter for Medicare annual wellness exam   Discussed applicable health maintenance/preventive health measures and advised and referred or ordered per patient preferences: -she decline bone density test -discussed vaccines due per cdc recs and where could get each Health Maintenance  Topic Date Due   Hepatitis C Screening  Never done   Zoster Vaccines- Shingrix (1 of 2) Never done   DEXA SCAN  01/01/2024 (Originally 01/01/2012)   COVID-19 Vaccine (8 - 2023-24 season) 01/15/2023   Medicare Annual Wellness (AWV)  01/01/2024   DTaP/Tdap/Td (2 - Tdap) 06/22/2030   Pneumonia Vaccine 47+ Years old  Completed   INFLUENZA VACCINE  Completed   HPV VACCINES  Aged Out   Colonoscopy  Discontinued       Education and counseling on the following was provided based on the above review of health and a plan/checklist for the patient, along with additional information discussed, was provided for the patient in the patient instructions :    -Advised and counseled on a healthy lifestyle - including the importance of a healthy diet, regular physical activity,  -Reviewed patient's current diet. Advised and counseled on a whole foods based healthy diet. A summary of a healthy diet was provided in the Patient Instructions.  -patient struggles with depression and declines questions on this today as reports she will discuss with her psychiatrist tomorrow - reports followed closely by psych and counseling -reviewed patient's current physical activity level and discussed exercise guidelines for adults. Discussed community resources and ideas for safe exercise at home to assist in meeting exercise guideline recommendations in a safe and healthy way. Encouraged to try 5 minutes per day of chair exercise to start and  slowly build from there. She did  -Advise yearly dental visits at minimum and regular eye exams   Follow up: see patient instructions     Patient Instructions  I really enjoyed getting to talk with you today! I am available on Tuesdays and Thursdays for virtual visits if you have any questions or concerns, or if I can be of any further assistance.   CHECKLIST FROM ANNUAL WELLNESS VISIT:  -Follow up (please call to schedule if not scheduled after visit):   -yearly for annual wellness visit with primary care office  Here is a list of your preventive care/health maintenance measures and the plan for each if any are due:  PLAN For any measures below that may be due:  -can get vaccines at the pharmacy - please let us know when you do so that we can update your records  Health Maintenance  Topic Date Due   Hepatitis C Screening  Never done   Zoster Vaccines- Shingrix (1 of 2) Never done   DEXA SCAN  01/01/2024 (Originally 01/01/2012)   COVID-19 Vaccine (8 - 2023-24 season) 01/15/2023   Medicare Annual Wellness (AWV)  01/01/2024   DTaP/Tdap/Td (2 - Tdap) 06/22/2030   Pneumonia Vaccine 52+ Years old  Completed   INFLUENZA VACCINE  Completed   HPV VACCINES  Aged Out   Colonoscopy  Discontinued    -See a dentist at least yearly  -Get your eyes checked and then per your eye specialist's recommendations  -Other issues addressed today:   -I have included below further information regarding a healthy whole foods based diet, physical activity guidelines for adults, stress management and opportunities for social connections. I hope  you find this information useful.   -----------------------------------------------------------------------------------------------------------------------------------------------------------------------------------------------------------------------------------------------------------  NUTRITION: -eat real food: lots of colorful vegetables (half the  plate) and fruits -5-7 servings of vegetables and fruits per day (fresh or steamed is best), exp. 2 servings of vegetables with lunch and dinner and 2 servings of fruit per day. Berries and greens such as kale and collards are great choices.  -consume on a regular basis: whole grains (make sure first ingredient on label contains the word "whole"), fresh fruits, fish, nuts, seeds, healthy oils (such as olive oil, avocado oil, grape seed oil) -may eat small amounts of dairy and lean meat on occasion, but avoid processed meats such as ham, bacon, lunch meat, etc. -drink water -try to avoid fast food and pre-packaged foods, processed meat -most experts advise limiting sodium to < 2300mg  per day, should limit further is any chronic conditions such as high blood pressure, heart disease, diabetes, etc. The American Heart Association advised that < 1500mg  is is ideal -try to avoid foods that contain any ingredients with names you do not recognize  -try to avoid sugar/sweets (except for the natural sugar that occurs in fresh fruit) -try to avoid sweet drinks -try to avoid white rice, white bread, pasta (unless whole grain), white or yellow potatoes  EXERCISE GUIDELINES FOR ADULTS: -if you wish to increase your physical activity, do so gradually and with the approval of your doctor -STOP and seek medical care immediately if you have any chest pain, chest discomfort or trouble breathing when starting or increasing exercise  -move and stretch your body, legs, feet and arms when sitting for long periods -Physical activity guidelines for optimal health in adults: -least 150 minutes per week of aerobic exercise (can talk, but not sing) once approved by your doctor, 20-30 minutes of sustained activity or two 10 minute episodes of sustained activity every day.  -resistance training at least 2 days per week if approved by your doctor -balance exercises 3+ days per week:   Stand somewhere where you have something  sturdy to hold onto if you lose balance.    1) lift up on toes, start with 5x per day and work up to 20x   2) stand and lift on leg straight out to the side so that foot is a few inches of the floor, start with 5x each side and work up to 20x each side   3) stand on one foot, start with 5 seconds each side and work up to 20 seconds on each side  If you need ideas or help with getting more active:  -Silver sneakers https://tools.silversneakers.com  -Walk with a Doc: http://www.duncan-williams.com/  -try to include resistance (weight lifting/strength building) and balance exercises twice per week: or the following link for ideas: http://castillo-powell.com/  BuyDucts.dk  STRESS MANAGEMENT: -can try meditating, or just sitting quietly with deep breathing while intentionally relaxing all parts of your body for 5 minutes daily -if you need further help with stress, anxiety or depression please follow up with your primary doctor or contact the wonderful folks at [facility]: [phone]  SOCIAL CONNECTIONS: -options in [place] if you wish to engage in more social and exercise related activities:  -Silver sneakers https://tools.silversneakers.com  -Walk with a Doc: http://www.duncan-williams.com/  -Check out the [facility] Active Adults 50+ section on the [place] [employer] (hiking clubs, book clubs, cards and games, chess, exercise classes, aquatic classes and much more) - see the website for details: https://www.[place].gov/departments/parks-recreation/active-adults50  -YouTube has lots of exercise videos for different  ages and abilities as well  -[facility] (a variety of indoor and outdoor inperson activities for adults). 813-066-0309. [address]  -Virtual Online Classes (a variety of topics): see seniorplanet.org or call [phone]  -consider  volunteering at a school, hospice center, church, senior center or elsewhere           Terressa Koyanagi, DO

## 2023-01-01 NOTE — Patient Instructions (Signed)
I really enjoyed getting to talk with you today! I am available on Tuesdays and Thursdays for virtual visits if you have any questions or concerns, or if I can be of any further assistance.   CHECKLIST FROM ANNUAL WELLNESS VISIT:  -Follow up (please call to schedule if not scheduled after visit):   -yearly for annual wellness visit with primary care office  Here is a list of your preventive care/health maintenance measures and the plan for each if any are due:  PLAN For any measures below that may be due:  -can get vaccines at the pharmacy - please let us know when you do so that we can update your records  Health Maintenance  Topic Date Due   Hepatitis C Screening  Never done   Zoster Vaccines- Shingrix (1 of 2) Never done   DEXA SCAN  01/01/2024 (Originally 01/01/2012)   COVID-19 Vaccine (8 - 2023-24 season) 01/15/2023   Medicare Annual Wellness (AWV)  01/01/2024   DTaP/Tdap/Td (2 - Tdap) 06/22/2030   Pneumonia Vaccine 39+ Years old  Completed   INFLUENZA VACCINE  Completed   HPV VACCINES  Aged Out   Colonoscopy  Discontinued    -See a dentist at least yearly  -Get your eyes checked and then per your eye specialist's recommendations  -Other issues addressed today:   -I have included below further information regarding a healthy whole foods based diet, physical activity guidelines for adults, stress management and opportunities for social connections. I hope you find this information useful.   -----------------------------------------------------------------------------------------------------------------------------------------------------------------------------------------------------------------------------------------------------------  NUTRITION: -eat real food: lots of colorful vegetables (half the plate) and fruits -5-7 servings of vegetables and fruits per day (fresh or steamed is best), exp. 2 servings of vegetables with lunch and dinner and 2 servings of fruit per  day. Berries and greens such as kale and collards are great choices.  -consume on a regular basis: whole grains (make sure first ingredient on label contains the word "whole"), fresh fruits, fish, nuts, seeds, healthy oils (such as olive oil, avocado oil, grape seed oil) -may eat small amounts of dairy and lean meat on occasion, but avoid processed meats such as ham, bacon, lunch meat, etc. -drink water -try to avoid fast food and pre-packaged foods, processed meat -most experts advise limiting sodium to < 2300mg  per day, should limit further is any chronic conditions such as high blood pressure, heart disease, diabetes, etc. The American Heart Association advised that < 1500mg  is is ideal -try to avoid foods that contain any ingredients with names you do not recognize  -try to avoid sugar/sweets (except for the natural sugar that occurs in fresh fruit) -try to avoid sweet drinks -try to avoid white rice, white bread, pasta (unless whole grain), white or yellow potatoes  EXERCISE GUIDELINES FOR ADULTS: -if you wish to increase your physical activity, do so gradually and with the approval of your doctor -STOP and seek medical care immediately if you have any chest pain, chest discomfort or trouble breathing when starting or increasing exercise  -move and stretch your body, legs, feet and arms when sitting for long periods -Physical activity guidelines for optimal health in adults: -least 150 minutes per week of aerobic exercise (can talk, but not sing) once approved by your doctor, 20-30 minutes of sustained activity or two 10 minute episodes of sustained activity every day.  -resistance training at least 2 days per week if approved by your doctor -balance exercises 3+ days per week:   Stand somewhere where you  have something sturdy to hold onto if you lose balance.    1) lift up on toes, start with 5x per day and work up to 20x   2) stand and lift on leg straight out to the side so that foot is  a few inches of the floor, start with 5x each side and work up to 20x each side   3) stand on one foot, start with 5 seconds each side and work up to 20 seconds on each side  If you need ideas or help with getting more active:  -Silver sneakers https://tools.silversneakers.com  -Walk with a Doc: http://www.duncan-williams.com/  -try to include resistance (weight lifting/strength building) and balance exercises twice per week: or the following link for ideas: http://castillo-powell.com/  BuyDucts.dk  STRESS MANAGEMENT: -can try meditating, or just sitting quietly with deep breathing while intentionally relaxing all parts of your body for 5 minutes daily -if you need further help with stress, anxiety or depression please follow up with your primary doctor or contact the wonderful folks at [facility]: [phone]  SOCIAL CONNECTIONS: -options in [place] if you wish to engage in more social and exercise related activities:  -Silver sneakers https://tools.silversneakers.com  -Walk with a Doc: http://www.duncan-williams.com/  -Check out the [facility] Active Adults 50+ section on the [place] [employer] (hiking clubs, book clubs, cards and games, chess, exercise classes, aquatic classes and much more) - see the website for details: https://www.[place].gov/departments/parks-recreation/active-adults50  -YouTube has lots of exercise videos for different ages and abilities as well  -[facility] (a variety of indoor and outdoor inperson activities for adults). [phone]. [address]  -Virtual Online Classes (a variety of topics): see seniorplanet.org or call [phone]  -consider volunteering at a school, hospice center, church, senior center or elsewhere

## 2023-01-02 ENCOUNTER — Telehealth: Payer: Medicare PPO | Admitting: Adult Health

## 2023-01-02 ENCOUNTER — Encounter: Payer: Self-pay | Admitting: Adult Health

## 2023-01-02 DIAGNOSIS — F332 Major depressive disorder, recurrent severe without psychotic features: Secondary | ICD-10-CM | POA: Diagnosis not present

## 2023-01-02 DIAGNOSIS — G4700 Insomnia, unspecified: Secondary | ICD-10-CM | POA: Diagnosis not present

## 2023-01-02 DIAGNOSIS — F411 Generalized anxiety disorder: Secondary | ICD-10-CM | POA: Diagnosis not present

## 2023-01-02 NOTE — Progress Notes (Signed)
Jamie Gamble [id number] Mar 22, 1946 77 y.o.  Virtual Visit via Video Note  I connected with pt @ on 01/02/23 at 10:20 AM EDT by a video enabled telemedicine application and verified that I am speaking with the correct person using two identifiers.   I discussed the limitations of evaluation and management by telemedicine and the availability of in person appointments. The patient expressed understanding and agreed to proceed.  I discussed the assessment and treatment plan with the patient. The patient was provided an opportunity to ask questions and all were answered. The patient agreed with the plan and demonstrated an understanding of the instructions.   The patient was advised to call back or seek an in-person evaluation if the symptoms worsen or if the condition fails to improve as anticipated.  I provided 10 minutes of non-face-to-face time during this encounter.  The patient was located at home.  The provider was located at [facility].   Dorothyann Gibbs, NP   Subjective:   Patient ID:  Jamie Gamble is a 77 y.o. (DOB 05-04-1945) female.  Chief Complaint: No chief complaint on file.   HPI JERRICA THORMAN presents for follow-up of GAD, insomnia and MDD.  Seeing therapist - Dr. Farrel Demark   Describes mood today as "about the same". Pleasant. Denies tearfulness. Mood symptoms - reports depression - "no interest in things". Reports anxiety - "about the same". Denies irritability. Denies panic attacks. Reports some worry, rumination, and over thinking - "I worry about my health". Reports some obsessive thoughts. Mood is low - uncertain if addition of Vraylar has been helpful - reports friend commented it may be helping. Stating "I feel about the same, but can't say I don't feel any better". Reports taking medications as prescribed. Varying interest and motivation. Taking medications as prescribed.  Energy levels lower. Active, does not have a regular exercise routine.  Enjoys some  usual interests and activities. Single. Lives alone. No family local.  Appetite decreased. Weight stable - 170 to 175 pounds. Reports sleep as consistent. Averages 5 hours - using CPAP nightly. Denies daytime napping. Focus and concentration is "ok". Completing some tasks - "a little bit, not a lot". Managing minimal aspects of household. Retired. Denies SI or HI.   Denies AH or VH. Denies self harm. Denies substance use.   Previous ECT treatment.  Review of Systems:  Review of Systems  Musculoskeletal:  Negative for gait problem.  Neurological:  Negative for tremors.  Psychiatric/Behavioral:         Please refer to HPI    Medications: I have reviewed the patient's current medications.  Current Outpatient Medications  Medication Sig Dispense Refill   atorvastatin (LIPITOR) 40 MG tablet TAKE ONE TABLET BY MOUTH AT BEDTIME 30 tablet 0   buPROPion (WELLBUTRIN XL) 150 MG 24 hr tablet Take 2 tablets (300 mg total) by mouth daily. 60 tablet 2   cariprazine (VRAYLAR) 1.5 MG capsule Take 1 capsule (1.5 mg total) by mouth daily. 30 capsule 2   FLUoxetine (PROZAC) 20 MG capsule Take 1 capsule (20 mg total) by mouth daily. 90 capsule 1   Glycerin-Hypromellose-PEG 400 (VISINE DRY EYE OP) Place 1 drop into both eyes daily as needed (dry eyes).     hydrochlorothiazide (MICROZIDE) 12.5 MG capsule TAKE ONE CAPSULE BY MOUTH ONCE DAILY 90 capsule 0   hydrOXYzine (ATARAX) 25 MG tablet Take 1 tablet (25 mg total) by mouth at bedtime as needed. 90 tablet 0   losartan (COZAAR) 25 MG  tablet TAKE ONE TABLET BY MOUTH DAILY 90 tablet 0   pantoprazole (PROTONIX) 40 MG tablet Take 1 tablet (40 mg total) by mouth daily. 90 tablet 1   Probiotic Product (PROBIOTIC DAILY PO) Take 1 capsule by mouth daily.     QUEtiapine (SEROQUEL) 25 MG tablet Take 1 tablet (25 mg total) by mouth at bedtime. 30 tablet 2   traZODone (DESYREL) 100 MG tablet Take 1 tablet (100 mg total) by mouth at bedtime. 90 tablet 1   No current  facility-administered medications for this visit.    Medication Side Effects: None  Allergies:  Allergies  Allergen Reactions   Nexium [Esomeprazole] Diarrhea    Past Medical History:  Diagnosis Date   Depression    GERD (gastroesophageal reflux disease)    Hyperlipidemia    Hypertension     Family History  Problem Relation Age of Onset   Depression Mother    Cancer Father    Depression Brother     Social History   Socioeconomic History   Marital status: Single    Spouse name: Not on file   Number of children: 0   Years of education: Not on file   Highest education level: Doctorate  Occupational History   Occupation: Retired  [employer] Employee  Tobacco Use   Smoking status: Former   Smokeless tobacco: Never  Advertising account planner   Vaping status: Never Used  Substance and Sexual Activity   Alcohol use: Not Currently   Drug use: Never   Sexual activity: Not on file  Other Topics Concern   Not on file  Social History Narrative   Not on file   Social Determinants of Health   Financial Resource Strain: Low Risk  (02/26/2021)   Overall Financial Resource Strain (CARDIA)    Difficulty of Paying Living Expenses: Not hard at all  Food Insecurity: No Food Insecurity (02/26/2021)   Hunger Vital Sign    Worried About Running Out of Food in the Last Year: Never true    Ran Out of Food in the Last Year: Never true  Transportation Needs: No Transportation Needs (02/26/2021)   PRAPARE - Administrator, Civil Service (Medical): No    Lack of Transportation (Non-Medical): No  Physical Activity: Sufficiently Active (02/26/2021)   Exercise Vital Sign    Days of Exercise per Week: 7 days    Minutes of Exercise per Session: 30 min  Stress: No Stress Concern Present (02/26/2021)   [employer] of Occupational Health - Occupational Stress Questionnaire    Feeling of Stress : Not at all  Social Connections: Unknown (08/06/2021)   Received from [facility], [facility]    Social Network    Social Network: Not on file  Intimate Partner Violence: Unknown (06/28/2021)   Received from [facility], [facility]   HITS    Physically Hurt: Not on file    Insult or Talk Down To: Not on file    Threaten Physical Harm: Not on file    Scream or Curse: Not on file    Past Medical History, Surgical history, Social history, and Family history were reviewed and updated as appropriate.   Please see review of systems for further details on the patient's review from today.   Objective:   Physical Exam:  There were no vitals taken for this visit.  Physical Exam Constitutional:      General: She is not in acute distress. Musculoskeletal:        General: No  deformity.  Neurological:     Mental Status: She is alert and oriented to person, place, and time.     Coordination: Coordination normal.  Psychiatric:        Attention and Perception: Attention and perception normal. She does not perceive auditory or visual hallucinations.        Mood and Affect: Affect is not labile, blunt, angry or inappropriate.        Speech: Speech normal.        Behavior: Behavior normal.        Thought Content: Thought content normal. Thought content is not paranoid or delusional. Thought content does not include homicidal or suicidal ideation. Thought content does not include homicidal or suicidal plan.        Cognition and Memory: Cognition and memory normal.        Judgment: Judgment normal.     Comments: Insight intact     Lab Review:     Component Value Date/Time   NA 140 08/13/2021 0905   K 4.6 08/13/2021 0905   CL 104 08/13/2021 0905   CO2 30 08/13/2021 0905   GLUCOSE 100 (H) 08/13/2021 0905   BUN 22 08/13/2021 0905   BUN 15 07/06/2019 0000   CREATININE 0.85 08/13/2021 0905   CALCIUM 9.3 08/13/2021 0905   PROT 6.8 08/13/2021 0905   ALBUMIN 4.0 08/13/2021 0905   AST 19 08/13/2021 0905   ALT 14 08/13/2021 0905   ALKPHOS 48 08/13/2021 0905   BILITOT 0.4  08/13/2021 0905   GFRNONAA >60 06/04/2021 1154       Component Value Date/Time   WBC 5.8 08/13/2021 0905   RBC 4.22 08/13/2021 0905   HGB 13.7 08/13/2021 0905   HCT 40.7 08/13/2021 0905   PLT 303.0 08/13/2021 0905   MCV 96.5 08/13/2021 0905   MCH 31.9 06/04/2021 1154   MCHC 33.7 08/13/2021 0905   RDW 13.8 08/13/2021 0905   LYMPHSABS 0.7 08/13/2021 0905   MONOABS 0.3 08/13/2021 0905   EOSABS 0.2 08/13/2021 0905   BASOSABS 0.1 08/13/2021 0905    No results found for: "POCLITH", "LITHIUM"   No results found for: "PHENYTOIN", "PHENOBARB", "VALPROATE", "CBMZ"   .res Assessment: Plan:    Plan:  Seeing [name] for therapy   PDMP reviewed  Continue:  Trazadone 100mg  - take one tablet at bedtime for sleep Seroquel 25mg  at hs  Hydroxyzine 25mg  - 1 at hs for sleep    Wellbutrin XL150mg  every morning - denies seizure history  Prozac 40mg  daily Vraylar 1.5mg  daily  Melatonin 5mg  at hs  Discussed NAC tablets and Magnesium L-threonate.  Consider Spravato - discussed as option  Followed by [name] - therapy   RTC 2 weeks   Patient advised to contact office with any questions, adverse effects, or acute worsening in signs and symptoms.  Discussed potential metabolic side effects associated with atypical antipsychotics, as well as potential risk for movement side effects. Advised pt to contact office if movement side effects occur.   Diagnoses and all orders for this visit:  Severe episode of recurrent major depressive disorder, without psychotic features (HCC)  Generalized anxiety disorder  Insomnia, unspecified type     Please see After Visit Summary for patient specific instructions.  Future Appointments  Date Time Provider Department Center  01/05/2023 10:00 AM Robley Fries, PhD [facility] None  02/10/2023 11:00 AM Robley Fries, PhD [facility] None  02/24/2023 10:00 AM Robley Fries, PhD [facility] None    No orders of the  defined types were placed in  this encounter.     -------------------------------

## 2023-01-05 ENCOUNTER — Ambulatory Visit: Payer: Medicare PPO | Admitting: Psychiatry

## 2023-01-05 DIAGNOSIS — Z9189 Other specified personal risk factors, not elsewhere classified: Secondary | ICD-10-CM

## 2023-01-05 DIAGNOSIS — F411 Generalized anxiety disorder: Secondary | ICD-10-CM | POA: Diagnosis not present

## 2023-01-05 DIAGNOSIS — F332 Major depressive disorder, recurrent severe without psychotic features: Secondary | ICD-10-CM | POA: Diagnosis not present

## 2023-01-05 DIAGNOSIS — F40232 Fear of other medical care: Secondary | ICD-10-CM

## 2023-01-05 NOTE — Telephone Encounter (Signed)
[employer] called on behalf of the Pt to F/U on this refill, stating Pt is almost out.  atorvastatin (LIPITOR) 40 MG tablet

## 2023-01-05 NOTE — Progress Notes (Signed)
Psychotherapy Progress Note [facility] [name], PhD LP  Patient ID: Jamie Gamble [facility])    MRN: [id number] Therapy format: Individual psychotherapy with friend [name] present throughout Date: 01/05/2023      Start: 10:08a     Stop: 10:55a     Time Spent: 47 min Location: In-person   Session narrative (presenting needs, interim history, self-report of stressors and symptoms, applications of prior therapy, status changes, and interventions made in session) [name] here as ride and personal comfort and support.  Made it in person, despite estimating 7-8 of 10 anxiety, suggested she's taking it as a challenge to break out of her isolating pattern.  Would like to be more social, as in get out to [employer]  Did get to [facility] for vaccination, and got out for hair cut, has been back to [employer] with [name].  Leery of meeting new people at [facility], but encouraged anyway to see about getting back to anything normal for her.  Reaffirmed that the more normal she does, and lets herself see, the more normal she'll feel.  Reports she is considering Spravato as a treatment less provocative and fearsome than ECT.  Referred to psychiatry for further information and experience, and generally encouraged that it can help unlock depressive thinking, but it will still be taking actions to break social aversion and activate behaviorally that will get her recovered.    Socially, she has pushed some anxious limitations, as noted, and did allow herself a vulnerable disclosure to a church mate that she has depression.  Nursing service was coming to her home for a little while, vague about why, but believed to be remnant of the service requested through her PCP.  Still averse to being detained by well-meaning neighbors or asked about herself by acquaintances.  Continue to advocate that she can legitimately shorten encounters, either by stating the need or by appealing to some likely issue, like  need to rest, or bathroom.  Discussed verbal and nonverbal tactics for bargaining down unwanted social contact while pushing herself humanely to prevent isolating.  With [name]'s leading, references a lost relationship, a boyfriend of about 3 years ago, as a trigger point for this episode of depression.  Looking forward, agrees she would like to be seen more often.  [name] is now retired, so more available to bring her.  Therapeutic modalities: Cognitive Behavioral Therapy, Solution-Oriented/Positive Psychology, and Ego-Supportive  Mental Status/Observations:  Appearance:   Casual, Neat, and pale, but less      Behavior:  Appropriate  Motor:  Normal  Speech/Language:   Clear, fluent, less weakened  Affect:  Appropriate and less tentative  Mood:  anxious and depressed  Thought process:  normal  Thought content:    Rumination  Sensory/Perceptual disturbances:    grossly intact  Orientation:  grossly intact  Attention:  Good    Concentration:  grossly intact  Memory:  WNL  Insight:    Good  Judgment:   Fair  Impulse Control:  Good   Risk Assessment: Danger to Self: No Self-injurious Behavior: No Danger to Others: No Physical Aggression / Violence: No Duty to Warn: No Access to Firearms a concern: No  Assessment of progress:  progressing  Diagnosis:   ICD-10-CM   1. Severe episode of recurrent major depressive disorder, without psychotic features (HCC)  F33.2     2. Generalized anxiety disorder  F41.1     3. At increased risk for social isolation  Z91.89     4.  Fear of other medical care particularly ECT  F40.232      Plan:  Safety & welfare plan -- Continue pledge of no harm, see one or the other mental health provider at least 2/month until returning to normal activity outside the home.  [name] remains POA and cleared to confer. Continue behavioral/social activation agreement to be in touch with [name], contact [employer] friends, and entertain asking someone to come  over. Still resolve to Continue to take NAC regularly in the morning Try Mg L-theonate regularly in the evening Make a daily practice of seeing some sunlight Try to get out for something, not just have it all delivered Allow a neighbor to interact briefly and try saying it if she doesn't want to linger Notice and dispute "I have to, but I can't" thinking -- either determine I don't have to after all, or I actually can do a little something Notice any difference these make, however small Direct prayers more toward asking God's help seeing and raising better things than "taking away" depression Sleep regulation -- Should continue orange glasses QPM to facilitate circadian signaling, sleep readiness, and sleep hardiness.  Best recommendation dosing sleep-assisting meds up to 1 hr before bed.  Option add melatonin 2-5mg  if needed 1-2 hr ahead of bed.  Practice sleep hygiene, including comfortable arrangements, lights reduced, and all caffeine stopped well ahead of bedtime (at least 6 hrs).  Use soothing imagery PRN for DFA.  Should adhere to CPAP and ensure proper maintenance.  For worries, option write down worry thoughts and consciously decide whether to stay up and think or lay them down and sleep.  Acknowledge and table worry about whether she will sleep -- trust the natural drive for sleep to come around, and if too wakeful, get out of bed to do something not too stimulating.  Trust also any uptick in dreams as natural progression of catching up sleep deficits and processing emotional life issues. Physiological mood factors -- Start the day with good lighting, movement/stretching, and a few minutes grounding, outdoors if available/desired.  Vitamin levels are normal, but improved B12 and D could still be helpful.  Should get some moderate exercise as able, even just walking and stretching, to combat depressogenic inactivity effects. Self-esteem and intrusive guilt/shame thoughts -- PRN practices of  blessing-counting and listing ways people have told/shown her she is lovable/capable.  No requirement to believe or agree, just consider.  Seek to notice and give self credit when she does anything constructive or principled for things that matter to her wellbeing or sense of purpose.  As needed, self-affirm God would not be punishing her, it's a trick of depression. Activities and socialization -- Develop activities of interest.  OK with working, if regains interest.  Self-monitor for broad demotivation.  Known options in [employer], [employer], and church activities at 3 locations, and ranging from chair yoga to entertainment/fellowship group to more theological and philosophical study.  Maintain/restore system of friends beyond [place], which may include anyone available at housing community.  Dispute worry thoughts of being judged, enter with permission to feel not that up to it.  [employer] can be available for supportive calls, assistance services, and possible facility-based recreation.  Home care companions when needed.  Self-monitor for impulse to more fully isolate, and in all involvements practice permission to be authentic about her tolerance for time spent. Financial concerns -- Follow through fact-finding and meeting with advisor, in coordination with friend/POA [name].  Still OK to seek part-time work  she can reach from her home.  Maintain cordial working relationship with nephew/trustee Fraser Din. Placement concerns -- Independent living with PRN assistance should be fine for the foreseeable future.  Available on request to interpret her condition to family members and work out concerns. Specialized therapies -- ECT on hold, has cognitively recovered for months, and the idea of shock therapy (and anesthesia) is highly anxiety-provoking to her.  Pt understands and accepts the burden to notify treatment team promptly of relapse in dark/dreadful mood, severe demotivation, or intractable insomnia.   Option Spravato, defer to psychiatry. Medical procedure anxiety -- Ask all necessary questions of health care, make sure to adequately comprehend recommendations rather than catastrophize.  For any procedure accepted, imagine th competence and caring of health care personnel, especially if it involves anesthesia and other vulnerable-making procedures. Weight management -- Concur with modified Weight Watchers approach and improved food choices at her interest.  Make sure to stay mobile, watch portions, limit carbs.  May observe practice of leaving a bite, work up concentration of foods contained in the MIND Diet. Other recommendations/advice -- As may be noted above.  Continue to utilize previously learned skills ad lib. Medication compliance -- Maintain medication as prescribed and work faithfully with relevant prescriber(s) if any changes are desired or seem indicated. Crisis service -- Aware of call list and work-in appts.  Call the clinic on-call service, 988/hotline, 911, or present to [facility] or ER if any life-threatening psychiatric crisis. Followup -- Return 1-2 wks, for put on CA list.  Next scheduled visit with me 02/10/2023.  Next scheduled in this office 02/10/2023.  Robley Fries, PhD [name], PhD LP Clinical Psychologist, [facility] [address], [address] [place], [place] [address] [phone]

## 2023-01-15 NOTE — Progress Notes (Incomplete)
Psychotherapy Progress Note [facility] [name], PhD LP  Patient ID: KEVON ARMAN [facility])    MRN: [id number] Therapy format: Individual psychotherapy with friend [name] present throughout Date: 01/05/2023      Start: 10:08a     Stop: 10:55a     Time Spent: 47 min Location: In-person   Session narrative (presenting needs, interim history, self-report of stressors and symptoms, applications of prior therapy, status changes, and interventions made in session) [name] here as ride and personal comfort and support.  Made it in person, despite estimating 7-8 of 10 anxiety, suggested she's taking it as a challenge to break out of her isolating pattern.  Would like to be more social, as in get out to [employer]  Did get to [facility] for vaccination, and got out for hair cut, has been back to [employer] with [name].  Leery of meeting new people at [facility], but encouraged anyway to see about getting back to anything normal for her.  Reaffirmed that the more normal she does, and lets herself see, the more normal she'll feel.  Reports she is considering Spravato as a treatment less provocative and fearsome than ECT.  Referred to psychiatry for further information and experience, and generally encouraged that it can help unlock depressive thinking, but it will still be taking actions to break social aversion and activate behaviorally that will get her recovered.    Socially, she has pushed some anxious limitations, as noted, and did allow herself a vulnerable disclosure to a church mate that she has depression.  Still averse to being detained by well-meaning neighbors or asked about herself by acquaintances.  Continue to advocate that she can legitimately shorten encounters, either by stating the need or by appealing to some likely issue, like need to rest, or bathroom  With [name]'s leading, references a lost relationship, a boyfriend of about 3 years ago.    Nursing service was  coming to her home for a little while, vague about why.    Discussed verbal and nonverbal tactics for bargaining down unwanted social contact  [name] is now retired, so more available to bring [employer]    Therapeutic modalities: {AM:23362::"Cognitive Behavioral Therapy","Solution-Oriented/Positive Psychology"}  Mental Status/Observations:  Appearance:   {PSY:22683}     Behavior:  {PSY:21022743}  Motor:  {PSY:22302}  Speech/Language:   {PSY:22685}  Affect:  {PSY:22687}  Mood:  {PSY:31886}  Thought process:  {PSY:31888}  Thought content:    {PSY:[phone]}  Sensory/Perceptual disturbances:    {[phone]}  Orientation:  {Psych Orientation:23301::"Fully oriented"}  Attention:  {Good-Fair-Poor ratings:23770::"Good"}    Concentration:  {Good-Fair-Poor ratings:23770::"Good"}  Memory:  {[phone]}  Insight:    {Good-Fair-Poor ratings:23770::"Good"}  Judgment:   {Good-Fair-Poor ratings:23770::"Good"}  Impulse Control:  {Good-Fair-Poor ratings:23770::"Good"}   Risk Assessment: Danger to Self: {Risk:22599::"No"} Self-injurious Behavior: {Risk:22599::"No"} Danger to Others: {Risk:22599::"No"} Physical Aggression / Violence: {Risk:22599::"No"} Duty to Warn: {AMYesNo:22526::"No"} Access to Firearms a concern: {AMYesNo:22526::"No"}  Assessment of progress:  {Progress:22147::"progressing"}  Diagnosis:   ICD-10-CM   1. Severe episode of recurrent major depressive disorder, without psychotic features (HCC)  F33.2     2. Generalized anxiety disorder  F41.1     3. At increased risk for social isolation  Z91.89     4. Fear of other medical care particularly ECT  F40.232      Plan:  *** Other recommendations/advice -- As may be noted above.  Continue to utilize previously learned skills ad lib. Medication compliance -- Maintain medication as prescribed and work faithfully with relevant prescriber(s)  if any changes are desired or seem indicated. Crisis service -- Aware of call list  and work-in appts.  Call the clinic on-call service, 988/hotline, 911, or present to [facility] or ER if any life-threatening psychiatric crisis. Followup -- Return 1-2 wks, for put on CA list.  Next scheduled visit with me 02/10/2023.  Next scheduled in this office 02/10/2023.  Robley Fries, PhD [name], PhD LP Clinical Psychologist, [facility] [address], [address] [place], [place] [address] [phone]

## 2023-01-16 ENCOUNTER — Telehealth: Payer: Medicare PPO | Admitting: Adult Health

## 2023-01-16 ENCOUNTER — Encounter: Payer: Self-pay | Admitting: Adult Health

## 2023-01-16 DIAGNOSIS — G4700 Insomnia, unspecified: Secondary | ICD-10-CM

## 2023-01-16 DIAGNOSIS — F332 Major depressive disorder, recurrent severe without psychotic features: Secondary | ICD-10-CM

## 2023-01-16 DIAGNOSIS — F411 Generalized anxiety disorder: Secondary | ICD-10-CM

## 2023-01-16 NOTE — Progress Notes (Signed)
Jamie Gamble [id number] Jul 25, 1945 77 y.o.  Virtual Visit via Video Note  I connected with pt @ on 01/16/23 at  1:20 PM EDT by a video enabled telemedicine application and verified that I am speaking with the correct person using two identifiers.   I discussed the limitations of evaluation and management by telemedicine and the availability of in person appointments. The patient expressed understanding and agreed to proceed.  I discussed the assessment and treatment plan with the patient. The patient was provided an opportunity to ask questions and all were answered. The patient agreed with the plan and demonstrated an understanding of the instructions.   The patient was advised to call back or seek an in-person evaluation if the symptoms worsen or if the condition fails to improve as anticipated.  I provided 15 minutes of non-face-to-face time during this encounter.  The patient was located at home.  The provider was located at [facility].   Jamie Gibbs, NP   Subjective:   Patient ID:  Jamie Gamble is a 77 y.o. (DOB [birth year]) female.  Chief Complaint: No chief complaint on file.   HPI Jamie Gamble presents for follow-up of GAD, insomnia and MDD.  Seeing therapist - Dr. Farrel Demark   Describes mood today as "about the same". Pleasant. Denies tearfulness. Mood symptoms - reports depression - "not wanting to do things - no interest". Reports anxiety - "a little". Denies irritability. Denies panic attacks. Reports some worry, rumination, and over thinking - "health". Reports some obsessive thoughts. Mood is low - Vraylar causing nightmares. Stating "I'm not feeling any better". Reports taking medications as prescribed. Varying interest and motivation. Taking medications as prescribed.  Energy levels lower. Active, does not have a regular exercise routine.  Enjoys some usual interests and activities. Single. Lives alone. No family local.  Appetite decreased. Weight stable -  170 to 175 pounds. Reports sleep as consistent. Averages 5 hours - using CPAP nightly. Denies daytime napping. Focus and concentration is "fair". Completing some tasks - "barely basic things". Managing minimal aspects of household. Retired. Denies SI or HI.   Denies AH or VH. Denies self harm. Denies substance use.   Previous ECT treatment.  Review of Systems:  Review of Systems  Musculoskeletal:  Negative for gait problem.  Neurological:  Negative for tremors.  Psychiatric/Behavioral:         Please refer to HPI    Medications: I have reviewed the patient's current medications.  Current Outpatient Medications  Medication Sig Dispense Refill   atorvastatin (LIPITOR) 40 MG tablet TAKE ONE TABLET BY MOUTH AT BEDTIME 30 tablet 0   buPROPion (WELLBUTRIN XL) 150 MG 24 hr tablet Take 2 tablets (300 mg total) by mouth daily. 60 tablet 2   cariprazine (VRAYLAR) 1.5 MG capsule Take 1 capsule (1.5 mg total) by mouth daily. 30 capsule 2   FLUoxetine (PROZAC) 20 MG capsule Take 1 capsule (20 mg total) by mouth daily. 90 capsule 1   Glycerin-Hypromellose-PEG 400 (VISINE DRY EYE OP) Place 1 drop into both eyes daily as needed (dry eyes).     hydrochlorothiazide (MICROZIDE) 12.5 MG capsule TAKE ONE CAPSULE BY MOUTH ONCE DAILY 90 capsule 0   hydrOXYzine (ATARAX) 25 MG tablet Take 1 tablet (25 mg total) by mouth at bedtime as needed. 90 tablet 0   losartan (COZAAR) 25 MG tablet TAKE ONE TABLET BY MOUTH DAILY 90 tablet 0   pantoprazole (PROTONIX) 40 MG tablet Take 1 tablet (40 mg total) by  mouth daily. 90 tablet 1   Probiotic Product (PROBIOTIC DAILY PO) Take 1 capsule by mouth daily.     QUEtiapine (SEROQUEL) 25 MG tablet Take 1 tablet (25 mg total) by mouth at bedtime. 30 tablet 2   traZODone (DESYREL) 100 MG tablet Take 1 tablet (100 mg total) by mouth at bedtime. 90 tablet 1   No current facility-administered medications for this visit.    Medication Side Effects: None  Allergies:   Allergies  Allergen Reactions   Nexium [Esomeprazole] Diarrhea    Past Medical History:  Diagnosis Date   Depression    GERD (gastroesophageal reflux disease)    Hyperlipidemia    Hypertension     Family History  Problem Relation Age of Onset   Depression Mother    Cancer Father    Depression Brother     Social History   Socioeconomic History   Marital status: Single    Spouse name: Not on file   Number of children: 0   Years of education: Not on file   Highest education level: Doctorate  Occupational History   Occupation: Retired  [employer] Employee  Tobacco Use   Smoking status: Former   Smokeless tobacco: Never  Advertising account planner   Vaping status: Never Used  Substance and Sexual Activity   Alcohol use: Not Currently   Drug use: Never   Sexual activity: Not on file  Other Topics Concern   Not on file  Social History Narrative   Not on file   Social Determinants of Health   Financial Resource Strain: Low Risk  (02/26/2021)   Overall Financial Resource Strain (CARDIA)    Difficulty of Paying Living Expenses: Not hard at all  Food Insecurity: No Food Insecurity (02/26/2021)   Hunger Vital Sign    Worried About Running Out of Food in the Last Year: Never true    Ran Out of Food in the Last Year: Never true  Transportation Needs: No Transportation Needs (02/26/2021)   PRAPARE - Administrator, Civil Service (Medical): No    Lack of Transportation (Non-Medical): No  Physical Activity: Sufficiently Active (02/26/2021)   Exercise Vital Sign    Days of Exercise per Week: 7 days    Minutes of Exercise per Session: 30 min  Stress: No Stress Concern Present (02/26/2021)   [employer] of Occupational Health - Occupational Stress Questionnaire    Feeling of Stress : Not at all  Social Connections: Unknown (08/06/2021)   Received from [facility]   Social Network    Social Network: Not on file  Intimate Partner Violence: Unknown (06/28/2021)    Received from [facility]   HITS    Physically Hurt: Not on file    Insult or Talk Down To: Not on file    Threaten Physical Harm: Not on file    Scream or Curse: Not on file    Past Medical History, Surgical history, Social history, and Family history were reviewed and updated as appropriate.   Please see review of systems for further details on the patient's review from today.   Objective:   Physical Exam:  There were no vitals taken for this visit.  Physical Exam Constitutional:      General: She is not in acute distress. Musculoskeletal:        General: No deformity.  Neurological:     Mental Status: She is alert and oriented to person, place, and time.     Coordination:  Coordination normal.  Psychiatric:        Attention and Perception: Attention and perception normal. She does not perceive auditory or visual hallucinations.        Mood and Affect: Affect is not labile, blunt, angry or inappropriate.        Speech: Speech normal.        Behavior: Behavior normal.        Thought Content: Thought content normal. Thought content is not paranoid or delusional. Thought content does not include homicidal or suicidal ideation. Thought content does not include homicidal or suicidal plan.        Cognition and Memory: Cognition and memory normal.        Judgment: Judgment normal.     Comments: Insight intact     Lab Review:     Component Value Date/Time   NA 140 08/13/2021 0905   K 4.6 08/13/2021 0905   CL 104 08/13/2021 0905   CO2 30 08/13/2021 0905   GLUCOSE 100 (H) 08/13/2021 0905   BUN 22 08/13/2021 0905   BUN 15 07/06/2019 0000   CREATININE 0.85 08/13/2021 0905   CALCIUM 9.3 08/13/2021 0905   PROT 6.8 08/13/2021 0905   ALBUMIN 4.0 08/13/2021 0905   AST 19 08/13/2021 0905   ALT 14 08/13/2021 0905   ALKPHOS 48 08/13/2021 0905   BILITOT 0.4 08/13/2021 0905   GFRNONAA >60 06/04/2021 1154       Component Value Date/Time   WBC 5.8 08/13/2021 0905    RBC 4.22 08/13/2021 0905   HGB 13.7 08/13/2021 0905   HCT 40.7 08/13/2021 0905   PLT 303.0 08/13/2021 0905   MCV 96.5 08/13/2021 0905   MCH 31.9 06/04/2021 1154   MCHC 33.7 08/13/2021 0905   RDW 13.8 08/13/2021 0905   LYMPHSABS 0.7 08/13/2021 0905   MONOABS 0.3 08/13/2021 0905   EOSABS 0.2 08/13/2021 0905   BASOSABS 0.1 08/13/2021 0905    No results found for: "POCLITH", "LITHIUM"   No results found for: "PHENYTOIN", "PHENOBARB", "VALPROATE", "CBMZ"   .res Assessment: Plan:    Plan:  Seeing [name] for therapy   PDMP reviewed  Continue:  Trazadone 100mg  - take one tablet at bedtime for sleep Seroquel 25mg  at hs  Hydroxyzine 25mg  - 1 at hs for sleep    Wellbutrin XL150mg  every morning - denies seizure history  Prozac 40mg  daily  D/C Vraylar 1.5mg  daily  Consider Spravato - discussed as option  Melatonin 5mg  at hs  Discussed NAC tablets and Magnesium L-threonate.  Followed by [name] - therapy   RTC 2 weeks   Patient advised to contact office with any questions, adverse effects, or acute worsening in signs and symptoms.  Discussed potential metabolic side effects associated with atypical antipsychotics, as well as potential risk for movement side effects. Advised pt to contact office if movement side effects occur.  Diagnoses and all orders for this visit:  Severe episode of recurrent major depressive disorder, without psychotic features (HCC)  Generalized anxiety disorder  Insomnia, unspecified type     Please see After Visit Summary for patient specific instructions.  Future Appointments  Date Time Provider Department Center  02/10/2023 11:00 AM Robley Fries, PhD [facility] None  02/24/2023 10:00 AM Robley Fries, PhD [facility] None    No orders of the defined types were placed in this encounter.     -------------------------------

## 2023-01-30 DIAGNOSIS — G4733 Obstructive sleep apnea (adult) (pediatric): Secondary | ICD-10-CM | POA: Diagnosis not present

## 2023-02-03 ENCOUNTER — Other Ambulatory Visit: Payer: Self-pay | Admitting: Internal Medicine

## 2023-02-05 DIAGNOSIS — G4733 Obstructive sleep apnea (adult) (pediatric): Secondary | ICD-10-CM | POA: Diagnosis not present

## 2023-02-10 ENCOUNTER — Ambulatory Visit: Payer: Medicare PPO | Admitting: Psychiatry

## 2023-02-10 DIAGNOSIS — F5104 Psychophysiologic insomnia: Secondary | ICD-10-CM | POA: Diagnosis not present

## 2023-02-10 DIAGNOSIS — F332 Major depressive disorder, recurrent severe without psychotic features: Secondary | ICD-10-CM | POA: Diagnosis not present

## 2023-02-10 DIAGNOSIS — F40232 Fear of other medical care: Secondary | ICD-10-CM | POA: Diagnosis not present

## 2023-02-10 DIAGNOSIS — Z9189 Other specified personal risk factors, not elsewhere classified: Secondary | ICD-10-CM | POA: Diagnosis not present

## 2023-02-10 DIAGNOSIS — F411 Generalized anxiety disorder: Secondary | ICD-10-CM

## 2023-02-10 NOTE — Progress Notes (Unsigned)
Psychotherapy Progress Note [facility] [name], PhD LP  Patient ID: Jamie Gamble [facility])    MRN: [id number] Therapy format: Individual psychotherapy Date: 02/10/2023      Start: 11:20a     Stop: 11:45a     Time Spent: 25 min Location: Telehealth visit -- I connected with this patient by an approved telecommunication method (video), with her informed consent, and verifying identity and patient privacy.  I was located at my office and patient at her home.  As needed, we discussed the limitations, risks, and security and privacy concerns associated with telehealth service, including the availability and conditions which currently govern in-person appointments and the possibility that 3rd-party payment may not be fully guaranteed and she may be responsible for charges.  After she indicated understanding, we proceeded with the session.  Also discussed treatment planning, as needed, including ongoing verbal agreement with the plan, the opportunity to ask and answer all questions, her demonstrated understanding of instructions, and her readiness to call the office should symptoms worsen or she feels she is in a crisis state and needs more immediate and tangible assistance.   Session narrative (presenting needs, interim history, self-report of stressors and symptoms, applications of prior therapy, status changes, and interventions made in session) Reached in her condo by video, as usual.  Drawn appearance, pained expression, listless, hypoverbal but clear in speech and thought.  Spent most of abbreviated session with hand over her eyes, phone held in the other hand, resting on the table.  Has relapsed in being shut in, she says.  Been out to eat with [name], willing when invited by her, and did get back out to [place] with her, but otherwise back to ordering groceries for delivery, avoiding neighbors as much as possible when fetching mail, and well out of [employer], church and any  other former activities.  Has not tried recommendation to assert time with neighbors, just doing her best to avoid them and "no interest" in diversifying her coping.  Medically, remains on meds as prescribed plus supplements as noted in latest psychiatry note, all faithful and on time, best I can determine.  Has tentatively taken recommendation to Spravato therapy, says the authorization has been written up and awaiting word.  Psychiatry tomorrow.  Says her BP has been normal, no sign of being overmedicated, and no c/o muscle aches from Lipitor.  Taking calcium for understood bone health, but no indications it is either causing or alleviating depression.  Just stuck feeling bad, withdrawing, believing very little is worth trying while she wishes to be relieved.  Not suicidal, just no pleasure, motivation, nor particular hope of improvement.  Family are in little touch.  Has the money she needs via nephew/trustee Fraser Din, says no need to approach him.  No contact in recent memory with her problematic SIL, Sherene Sires, whom she reported last fall and last spring she did not trust not to manipulate the trustee.    Probed her interests for any improvement or effort, and truthfully says she has very little.  Would like some energy to return, but immediately and globally resistant to any suggestion to take herself out, walk in a nearby park, or even stretch to a guided program on TV.  All still with her hand over her eyes and phone on laying in hand on the table.    Elected as before to shorten the session out of compassion for what appears to be an ordeal at this time.  Encouraged be  willing to begin Spravato without delay if she finds out tomorrow she is approved, and otherwise to give herself the gift of a stimulus, by stretching, or walking, or venturing out somehow, in any amount, because whatever she does, or chooses not to do, will either serve her recovery or serve depression.    Therapeutic modalities:  {AM:23362::"Cognitive Behavioral Therapy","Solution-Oriented/Positive Psychology"}  Mental Status/Observations:  Appearance:   {PSY:22683}     Behavior:  {PSY:21022743}  Motor:  {PSY:22302}  Speech/Language:   {PSY:22685}  Affect:  {PSY:22687}  Mood:  {PSY:31886}  Thought process:  {PSY:31888}  Thought content:    {PSY:[id number]}  Sensory/Perceptual disturbances:    {PSY:505-453-2705}  Orientation:  {Psych Orientation:23301::"Fully oriented"}  Attention:  {Good-Fair-Poor ratings:23770::"Good"}    Concentration:  {Good-Fair-Poor ratings:23770::"Good"}  Memory:  {PSY:347-479-8278}  Insight:    {Good-Fair-Poor ratings:23770::"Good"}  Judgment:   {Good-Fair-Poor ratings:23770::"Good"}  Impulse Control:  {Good-Fair-Poor ratings:23770::"Good"}   Risk Assessment: Danger to Self: {Risk:22599::"No"} Self-injurious Behavior: {Risk:22599::"No"} Danger to Others: {Risk:22599::"No"} Physical Aggression / Violence: {Risk:22599::"No"} Duty to Warn: {AMYesNo:22526::"No"} Access to Firearms a concern: {AMYesNo:22526::"No"}  Assessment of progress:  {Progress:22147::"progressing"}  Diagnosis: No diagnosis found. Plan:  *** Other recommendations/advice -- As may be noted above.  Continue to utilize previously learned skills ad lib. Medication compliance -- Maintain medication as prescribed and work faithfully with relevant prescriber(s) if any changes are desired or seem indicated. Crisis service -- Aware of call list and work-in appts.  Call the clinic on-call service, 988/hotline, 911, or present to [facility] or ER if any life-threatening psychiatric crisis. Followup -- No follow-ups on file.  Next scheduled visit with me 02/24/2023.  Next scheduled in this office 02/11/2023.  Robley Fries, PhD [name], PhD LP Clinical Psychologist, [facility] [address] [place], [place] [address] [phone]

## 2023-02-11 ENCOUNTER — Encounter: Payer: Self-pay | Admitting: Adult Health

## 2023-02-11 ENCOUNTER — Other Ambulatory Visit: Payer: Self-pay | Admitting: Adult Health

## 2023-02-11 ENCOUNTER — Telehealth: Payer: Medicare PPO | Admitting: Adult Health

## 2023-02-11 DIAGNOSIS — F411 Generalized anxiety disorder: Secondary | ICD-10-CM

## 2023-02-11 DIAGNOSIS — F329 Major depressive disorder, single episode, unspecified: Secondary | ICD-10-CM

## 2023-02-11 DIAGNOSIS — G4700 Insomnia, unspecified: Secondary | ICD-10-CM | POA: Diagnosis not present

## 2023-02-11 DIAGNOSIS — F332 Major depressive disorder, recurrent severe without psychotic features: Secondary | ICD-10-CM

## 2023-02-11 DIAGNOSIS — F331 Major depressive disorder, recurrent, moderate: Secondary | ICD-10-CM

## 2023-02-11 MED ORDER — QUETIAPINE FUMARATE 25 MG PO TABS: 25.0000 mg | ORAL_TABLET | Freq: Every day | ORAL | 1 refills | Status: DC

## 2023-02-11 MED ORDER — FLUOXETINE HCL 20 MG PO CAPS
20.0000 mg | ORAL_CAPSULE | Freq: Every day | ORAL | 1 refills | Status: DC
Start: 2023-02-11 — End: 2023-05-12

## 2023-02-11 MED ORDER — TRAZODONE HCL 100 MG PO TABS
ORAL_TABLET | ORAL | 1 refills | Status: DC
Start: 2023-02-11 — End: 2023-08-14

## 2023-02-11 MED ORDER — HYDROXYZINE HCL 25 MG PO TABS: 25.0000 mg | ORAL_TABLET | Freq: Every evening | ORAL | 1 refills | Status: DC | PRN

## 2023-02-11 MED ORDER — BUPROPION HCL ER (XL) 150 MG PO TB24: 300.0000 mg | ORAL_TABLET | Freq: Every day | ORAL | 1 refills | Status: DC

## 2023-02-11 NOTE — Progress Notes (Signed)
Jamie Gamble [id number] 1946-01-30 77 y.o.  Virtual Visit via Video Note  I connected with pt @ on 02/11/23 at  1:20 PM EST by a video enabled telemedicine application and verified that I am speaking with the correct person using two identifiers.   I discussed the limitations of evaluation and management by telemedicine and the availability of in person appointments. The patient expressed understanding and agreed to proceed.  I discussed the assessment and treatment plan with the patient. The patient was provided an opportunity to ask questions and all were answered. The patient agreed with the plan and demonstrated an understanding of the instructions.   The patient was advised to call back or seek an in-person evaluation if the symptoms worsen or if the condition fails to improve as anticipated.  I provided 15 minutes of non-face-to-face time during this encounter.  The patient was located at home.  The provider was located at [facility].   Jamie Gibbs, NP   Subjective:   Patient ID:  Jamie Gamble is a 77 y.o. (DOB 1945/04/06) female.  Chief Complaint: No chief complaint on file.   HPI Jamie Gamble presents for follow-up of GAD, insomnia and MDD.  Seeing therapist - Dr. Farrel Demark   Describes mood today as "not any better". Pleasant. Denies tearfulness. Mood symptoms - reports depression and anxiety. Denies irritability. Denies panic attacks. Reports some worry, rumination, and over thinking - "about my health". Reports some obsessive thoughts. Mood is low. Stating "I feel about the same". Reports taking medications as prescribed. Varying interest and motivation. Taking medications as prescribed.  Energy levels lower. Active, does not have a regular exercise routine.  Enjoys some usual interests and activities. Single. Lives alone. No family local.  Appetite decreased. Weight gain - 180 pounds. Reports sleep as consistent. Averages 5 to 6 hours - using CPAP nightly.  Denies daytime napping. Focus and concentration is "fair". Completing some tasks - "barely". Managing minimal aspects of household. Retired. Denies SI or HI.   Denies AH or VH. Denies self harm. Denies substance use.   Previous ECT treatment.   Review of Systems:  Review of Systems  Musculoskeletal:  Negative for gait problem.  Neurological:  Negative for tremors.  Psychiatric/Behavioral:         Please refer to HPI    Medications: I have reviewed the patient's current medications.  Current Outpatient Medications  Medication Sig Dispense Refill   atorvastatin (LIPITOR) 40 MG tablet TAKE ONE TABLET BY MOUTH AT BEDTIME. 30 tablet 0   buPROPion (WELLBUTRIN XL) 150 MG 24 hr tablet Take 2 tablets (300 mg total) by mouth daily. 60 tablet 2   cariprazine (VRAYLAR) 1.5 MG capsule Take 1 capsule (1.5 mg total) by mouth daily. 30 capsule 2   FLUoxetine (PROZAC) 20 MG capsule Take 1 capsule (20 mg total) by mouth daily. 90 capsule 1   Glycerin-Hypromellose-PEG 400 (VISINE DRY EYE OP) Place 1 drop into both eyes daily as needed (dry eyes).     hydrochlorothiazide (MICROZIDE) 12.5 MG capsule TAKE ONE CAPSULE BY MOUTH ONCE DAILY 90 capsule 0   hydrOXYzine (ATARAX) 25 MG tablet Take 1 tablet (25 mg total) by mouth at bedtime as needed. 90 tablet 0   losartan (COZAAR) 25 MG tablet TAKE ONE TABLET BY MOUTH DAILY 90 tablet 0   pantoprazole (PROTONIX) 40 MG tablet Take 1 tablet (40 mg total) by mouth daily. 90 tablet 1   Probiotic Product (PROBIOTIC DAILY PO) Take 1 capsule  by mouth daily.     QUEtiapine (SEROQUEL) 25 MG tablet Take 1 tablet (25 mg total) by mouth at bedtime. 30 tablet 2   traZODone (DESYREL) 100 MG tablet Take 1 tablet (100 mg total) by mouth at bedtime. 90 tablet 1   No current facility-administered medications for this visit.    Medication Side Effects: None  Allergies:  Allergies  Allergen Reactions   Nexium [Esomeprazole] Diarrhea    Past Medical History:  Diagnosis  Date   Depression    GERD (gastroesophageal reflux disease)    Hyperlipidemia    Hypertension     Family History  Problem Relation Age of Onset   Depression Mother    Cancer Father    Depression Brother     Social History   Socioeconomic History   Marital status: Single    Spouse name: Not on file   Number of children: 0   Years of education: Not on file   Highest education level: Doctorate  Occupational History   Occupation: Retired [place] [employer] Employee  Tobacco Use   Smoking status: Former   Smokeless tobacco: Never  Advertising account planner   Vaping status: Never Used  Substance and Sexual Activity   Alcohol use: Not Currently   Drug use: Never   Sexual activity: Not on file  Other Topics Concern   Not on file  Social History Narrative   Not on file   Social Determinants of Health   Financial Resource Strain: Low Risk  (02/26/2021)   Overall Financial Resource Strain (CARDIA)    Difficulty of Paying Living Expenses: Not hard at all  Food Insecurity: No Food Insecurity (02/26/2021)   Hunger Vital Sign    Worried About Running Out of Food in the Last Year: Never true    Ran Out of Food in the Last Year: Never true  Transportation Needs: No Transportation Needs (02/26/2021)   PRAPARE - Administrator, Civil Service (Medical): No    Lack of Transportation (Non-Medical): No  Physical Activity: Sufficiently Active (02/26/2021)   Exercise Vital Sign    Days of Exercise per Week: 7 days    Minutes of Exercise per Session: 30 min  Stress: No Stress Concern Present (02/26/2021)   [employer] of Occupational Health - Occupational Stress Questionnaire    Feeling of Stress : Not at all  Social Connections: Unknown (08/06/2021)   Received from [facility]   Social Network    Social Network: Not on file  Intimate Partner Violence: Unknown (06/28/2021)   Received from [facility]   HITS    Physically Hurt: Not on file    Insult or Talk Down  To: Not on file    Threaten Physical Harm: Not on file    Scream or Curse: Not on file    Past Medical History, Surgical history, Social history, and Family history were reviewed and updated as appropriate.   Please see review of systems for further details on the patient's review from today.   Objective:   Physical Exam:  There were no vitals taken for this visit.  Physical Exam Constitutional:      General: She is not in acute distress. Musculoskeletal:        General: No deformity.  Neurological:     Mental Status: She is alert and oriented to person, place, and time.     Coordination: Coordination normal.  Psychiatric:        Attention and Perception: Attention  and perception normal. She does not perceive auditory or visual hallucinations.        Mood and Affect: Mood normal. Mood is not anxious or depressed. Affect is not labile, blunt, angry or inappropriate.        Speech: Speech normal.        Behavior: Behavior normal.        Thought Content: Thought content normal. Thought content is not paranoid or delusional. Thought content does not include homicidal or suicidal ideation. Thought content does not include homicidal or suicidal plan.        Cognition and Memory: Cognition and memory normal.        Judgment: Judgment normal.     Comments: Insight intact     Lab Review:     Component Value Date/Time   NA 140 08/13/2021 0905   K 4.6 08/13/2021 0905   CL 104 08/13/2021 0905   CO2 30 08/13/2021 0905   GLUCOSE 100 (H) 08/13/2021 0905   BUN 22 08/13/2021 0905   BUN 15 07/06/2019 0000   CREATININE 0.85 08/13/2021 0905   CALCIUM 9.3 08/13/2021 0905   PROT 6.8 08/13/2021 0905   ALBUMIN 4.0 08/13/2021 0905   AST 19 08/13/2021 0905   ALT 14 08/13/2021 0905   ALKPHOS 48 08/13/2021 0905   BILITOT 0.4 08/13/2021 0905   GFRNONAA >60 06/04/2021 1154       Component Value Date/Time   WBC 5.8 08/13/2021 0905   RBC 4.22 08/13/2021 0905   HGB 13.7 08/13/2021 0905    HCT 40.7 08/13/2021 0905   PLT 303.0 08/13/2021 0905   MCV 96.5 08/13/2021 0905   MCH 31.9 06/04/2021 1154   MCHC 33.7 08/13/2021 0905   RDW 13.8 08/13/2021 0905   LYMPHSABS 0.7 08/13/2021 0905   MONOABS 0.3 08/13/2021 0905   EOSABS 0.2 08/13/2021 0905   BASOSABS 0.1 08/13/2021 0905    No results found for: "POCLITH", "LITHIUM"   No results found for: "PHENYTOIN", "PHENOBARB", "VALPROATE", "CBMZ"   .res Assessment: Plan:    Plan:  Seeing [name] for therapy   PDMP reviewed  Continue:  Trazadone 100mg  - take one tablet at bedtime for sleep Seroquel 25mg  at hs  Hydroxyzine 25mg  - 1 at hs for sleep   Prozac 40mg  daily  Increase Wellbutrin XL150mg  to 300mg  every - taking 2 of the 150mg  tablets every morning - denies seizure history   Consider Spravato - waiting for insurance update.  Melatonin 5mg  at hs  Discussed NAC tablets and Magnesium L-threonate.  Followed by [name] - therapy   RTC 2 weeks   Patient advised to contact office with any questions, adverse effects, or acute worsening in signs and symptoms.  Discussed potential metabolic side effects associated with atypical antipsychotics, as well as potential risk for movement side effects. Advised pt to contact office if movement side effects occur.  There are no diagnoses linked to this encounter.   Please see After Visit Summary for patient specific instructions.  Future Appointments  Date Time Provider Department Center  02/11/2023  1:20 PM Wynema Garoutte, Thereasa Solo, NP [facility] None  02/24/2023 10:00 AM Robley Fries, PhD [facility] None  03/31/2023 10:00 AM Robley Fries, PhD [facility] None  05/01/2023 11:00 AM Robley Fries, PhD [facility] None    No orders of the defined types were placed in this encounter.     -------------------------------

## 2023-02-16 ENCOUNTER — Other Ambulatory Visit: Payer: Self-pay | Admitting: Internal Medicine

## 2023-02-24 ENCOUNTER — Telehealth: Payer: Self-pay

## 2023-02-24 ENCOUNTER — Ambulatory Visit: Payer: Medicare PPO | Admitting: Psychiatry

## 2023-02-24 DIAGNOSIS — F332 Major depressive disorder, recurrent severe without psychotic features: Secondary | ICD-10-CM | POA: Diagnosis not present

## 2023-02-24 DIAGNOSIS — Z9189 Other specified personal risk factors, not elsewhere classified: Secondary | ICD-10-CM | POA: Diagnosis not present

## 2023-02-24 DIAGNOSIS — Z741 Need for assistance with personal care: Secondary | ICD-10-CM | POA: Diagnosis not present

## 2023-02-24 DIAGNOSIS — F5104 Psychophysiologic insomnia: Secondary | ICD-10-CM | POA: Diagnosis not present

## 2023-02-24 DIAGNOSIS — R69 Illness, unspecified: Secondary | ICD-10-CM | POA: Diagnosis not present

## 2023-02-24 NOTE — Progress Notes (Signed)
Psychotherapy Progress Note [facility] [name], PhD LP  Patient ID: Jamie Gamble [facility])    MRN: [id number] Therapy format: Individual psychotherapy Date: 02/24/2023      Start: 10:24a     Stop: 10:50a     Time Spent: 26 min Location: Telehealth visit -- I connected with this patient by an approved telecommunication method (video), with her informed consent, and verifying identity and patient privacy.  I was located at my office and patient at her home.  As needed, we discussed the limitations, risks, and security and privacy concerns associated with telehealth service, including the availability and conditions which currently govern in-person appointments and the possibility that 3rd-party payment may not be fully guaranteed and she may be responsible for charges.  After she indicated understanding, we proceeded with the session.  Also discussed treatment planning, as needed, including ongoing verbal agreement with the plan, the opportunity to ask and answer all questions, her demonstrated understanding of instructions, and her readiness to call the office should symptoms worsen or she feels she is in a crisis state and needs more immediate and tangible assistance.   Session narrative (presenting needs, interim history, self-report of stressors and symptoms, applications of prior therapy, status changes, and interventions made in session) 2 wks since last seen.  Tx detained by previous patient, arrived at video session 10:24a.  Still no news on Spravato authorization.  Messaged nurse in session, who replied that she is working on the [place] today and expects to have word this afternoon, though it is likely to come at substantial OOP cost, and that means a foreseeable resistance.  At any rate, nurse will contact, likely this afternoon, with news.  Otherwise, Pt says she is "not especially" getting out of the house or trying anything discussed before.  [name] comes by weekly, on a  soft-scheduled, call-ahead basis, shares a meal and some time.  No highlights or issues, just maintaining, and comfortable enough seeing her.  No perceived needs or requests to make of [name], or for her living situation or household.  Probed about past ideas to let a [employer] friend visit and to brave the possibility of running into neighbors fetching the mail, but still declines both, honestly doesn't feel like it.  Denies that shutting in like this feels particularly confining, either.  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  Is observably dressed and groomed today, somewhat better than last visit, though she still keeps her hand over her eyes most of the time, and the phone lays in her hand resting on a table, as if all but too weary to do this.  Lighting looks good, no observations or c/o poor appetite or hydration.  Checked medication -- Remains compliant with meds and supplements noted in last med note.  Says she cut Wellbutrin back to 150mg  because she developed constipation, but did not consult psychiatry.  Encouraged to consult psychiatry about that rather than make her own decision, b/c she could be shorting herself benefit when adding a stool softener might be wiser than reducing antidepressant.    Messaged nurse in session as noted.  Allowed to cut shorter, reaffirmed followup within a month and willingness to call if emergency or pressure to self-harm.  Therapeutic modalities: Cognitive Behavioral Therapy, Solution-Oriented/Positive Psychology, and Ego-Supportive  Mental Status/Observations:  Appearance:   worn      Behavior:  Resistant  Motor:  inactive  Speech/Language:   minimal  Affect:  Depressed  Mood:  depressed  Thought process:  normal  Thought content:    WNL  Sensory/Perceptual disturbances:    WNL  Orientation:  Fully oriented  Attention:  Good    Concentration:  Good   Memory:  grossly intact  Insight:    Not assessed  Judgment:   Fair  Impulse Control:  grossly intact   Risk Assessment: Danger to Self: No Self-injurious Behavior: No Danger to Others: No Physical Aggression / Violence: No Duty to Warn: No Access to Firearms a concern: No  Assessment of progress:   declining  Diagnosis:   ICD-10-CM   1. Severe episode of recurrent major depressive disorder, without psychotic features (HCC)  F33.2     2. At increased risk for social isolation  Z91.89     3. Psychophysiological insomnia  F51.04     4. Dependent for money management  Z74.1     5. r/o Avoidant Personality  R69    hx of Dependent PD dx, suspected made in haste by a hospitalist; issues seem more restricting herself to "safe" persons     Plan:  Safety & welfare plan -- Continue pledge of no harm, see one or the other mental health provider at least 2/month until returning to normal activity outside the home.  [name] remains POA and cleared to confer PRN.  Recommend all treatment team members stand willing to contact her if any indications of poor safety, poor comprehension, or poor judgment. Still priority resolve to Continue supplements -- NAC regularly in the morning, Mg L-theonate regularly in the evening Make a daily practice of seeing some sunlight in the morning Try to get out for something, not just have it all delivered Try to allow a neighbor to interact briefly, and say so if she'd like to get back home, trust it's not "rude" but honest Notice and dispute "I have to, but I can't" thinking wrestling with tasks -- either determine I don't have to after all, or I actually can do a little something Notice any difference these make, however small Prayer tone -- Asking God to "take away" depression is honest, but asking help seeing better things and raising stronger responses is more constructive Behavioral/social activation -- advise be in touch with [name], try to contact  Toastmaster[employer] friends, and entertain asking someone to come over or get out with them somehow Sleep regulation -- Should continue orange glasses QPM to facilitate circadian signaling, sleep readiness, and sleep hardiness.  Best recommendation dosing sleep-assisting meds up to 1 hr before bed.  Option add melatonin 2-5mg  if needed 1-2 hr ahead of bed.  Practice sleep hygiene, including comfortable arrangements, lights reduced, and all caffeine stopped well ahead of bedtime (at least 6 hrs).  Use soothing imagery PRN for DFA.  Should adhere to CPAP and ensure proper maintenance.  For worries, option write down worry thoughts and consciously decide whether to stay up and think or lay them down and sleep.  Acknowledge and table worry about whether she will sleep -- trust the natural drive for sleep to come around, and if too wakeful, get out of bed to do something not too stimulating.  Trust also any uptick in dreams as natural progression of catching up sleep deficits and processing emotional life issues. Physiological mood factors -- Start the day with good lighting, movement/stretching, and a few minutes grounding, outdoors if available/desired.  Vitamin levels are normal, but improved  B12 and D could still be helpful.  Should get some moderate exercise as able, even just walking and stretching, to combat depressogenic inactivity effects. Self-esteem and intrusive guilt/shame thoughts -- PRN practices of blessing-counting and listing ways people have told/shown her she is lovable/capable.  No requirement to believe or agree, just consider.  Seek to notice and give self credit when she does anything constructive or principled for things that matter to her wellbeing or sense of purpose.  As needed, self-affirm God would not be punishing her, it's a trick of depression to think it. Activities and socialization -- Develop activities of interest.  Self-monitor for broad demotivation.  Known options in [employer],  [employer], and church activities at 3 locations, and ranging from chair yoga to entertainment/fellowship group to more theological and philosophical study.  Maintain/restore system of friends beyond [place], which may include anyone available at housing community.  Dispute worry thoughts of being judged, enter with permission to feel not that up to it.  [employer] can be available for supportive calls, assistance services, and possible facility-based recreation.  Home care companions when needed.  Self-monitor for impulse to more fully isolate, and in all involvements practice permission to be authentic about her tolerance for spending time in view or the presence of others.  OK with working, if regains interest. Financial concerns -- Follow through fact-finding and meeting with advisor, in coordination with friend/POA [name].  Still OK to seek part-time work she can reach from her home.  Maintain cordial working relationship with nephew/trustee Fraser Din. Placement concerns -- Independent living with PRN assistance should be fine for the foreseeable future.  Available on request to interpret her condition to family members and work out concerns. Specialized therapies -- ECT on hold, has cognitively recovered for months, and the idea of shock therapy (and anesthesia) is highly anxiety-provoking to her.  Pt understands and accepts the burden to notify treatment team promptly of relapse in dark/dreadful mood, severe demotivation, or intractable insomnia.  Option Spravato, defer to psychiatry. Medical procedure anxiety -- Ask all necessary questions of health care, make sure to adequately comprehend recommendations rather than catastrophize without knowing.  For any procedure accepted, imagine the competence and caring of health care personnel in action, especially if it involves anesthesia and other vulnerable-making procedures. Weight management -- Concur with modified Weight Watchers approach and improved food  choices at her interest.  Make sure to stay mobile, watch portions, limit carbs.  May observe practice of leaving a bite, work up concentration of foods contained in the MIND Diet. Other recommendations/advice -- As may be noted above.  Continue to utilize previously learned skills ad lib. Medication compliance -- Maintain medication as prescribed and work faithfully with relevant prescriber(s) if any changes are desired or seem indicated. Crisis service -- Aware of call list and work-in appts.  Call the clinic on-call service, 988/hotline, 911, or present to [facility] or ER if any life-threatening psychiatric crisis. Followup -- Return for time as already scheduled, avail earlier @ PT's need.  Next scheduled visit with me 03/31/2023.  Next scheduled in this office 03/11/2023.  Robley Fries, PhD [name], PhD LP Clinical Psychologist, [facility] [address] [place], [place] [address] [phone]

## 2023-02-24 NOTE — Telephone Encounter (Signed)
Prior Authorization initiated with [facility] for Spravato 56 mg approval received for Pharmacy and Medical Benefit effective 03/24/2022-03/23/2024, PA# [id number]

## 2023-02-26 ENCOUNTER — Other Ambulatory Visit: Payer: Self-pay

## 2023-02-27 ENCOUNTER — Other Ambulatory Visit: Payer: Self-pay

## 2023-02-27 NOTE — Telephone Encounter (Signed)
Left message with [facility] to call back to discuss cost for pt's Spravato, PA has been approved but she has Medicare and unable to use a savings card.   Rx pended until discussing with pharmacist first.

## 2023-03-01 DIAGNOSIS — G4733 Obstructive sleep apnea (adult) (pediatric): Secondary | ICD-10-CM | POA: Diagnosis not present

## 2023-03-02 ENCOUNTER — Other Ambulatory Visit: Payer: Self-pay | Admitting: Internal Medicine

## 2023-03-11 ENCOUNTER — Encounter: Payer: Self-pay | Admitting: Adult Health

## 2023-03-11 ENCOUNTER — Telehealth: Payer: Medicare PPO | Admitting: Adult Health

## 2023-03-11 DIAGNOSIS — F411 Generalized anxiety disorder: Secondary | ICD-10-CM | POA: Diagnosis not present

## 2023-03-11 DIAGNOSIS — F332 Major depressive disorder, recurrent severe without psychotic features: Secondary | ICD-10-CM

## 2023-03-11 DIAGNOSIS — G4700 Insomnia, unspecified: Secondary | ICD-10-CM | POA: Diagnosis not present

## 2023-03-11 NOTE — Progress Notes (Signed)
Jamie Gamble [id number] 1945-04-25 77 y.o.  Virtual Visit via Video Note  I connected with pt @ on 03/11/23 at 11:40 AM EST by a video enabled telemedicine application and verified that I am speaking with the correct person using two identifiers.   I discussed the limitations of evaluation and management by telemedicine and the availability of in person appointments. The patient expressed understanding and agreed to proceed.  I discussed the assessment and treatment plan with the patient. The patient was provided an opportunity to ask questions and all were answered. The patient agreed with the plan and demonstrated an understanding of the instructions.   The patient was advised to call back or seek an in-person evaluation if the symptoms worsen or if the condition fails to improve as anticipated.  I provided 25 minutes of non-face-to-face time during this encounter.  The patient was located at home.  The provider was located at [facility].   Dorothyann Gibbs, NP   Subjective:   Patient ID:  Jamie Gamble is a 77 y.o. (DOB Aug 01, 1945) female.  Chief Complaint: No chief complaint on file.   HPI KENNEDY LEBECK presents for follow-up of GAD, insomnia and MDD.  Seeing therapist - Dr. Farrel Demark   Describes mood today as "about the same". Pleasant. Denies tearfulness. Mood symptoms - reports depression and anxiety. Denies irritability. Denies panic attacks. Reports some worry, rumination, and over thinking - "about my health". Reports some obsessive thoughts. Mood is low. Stating "I don't feel any better". Reports taking medications as prescribed, but does not feel they are helpful. Plans to start Spravato in January. Varying interest and motivation. Taking medications as prescribed.  Energy levels lower. Active, does not have a regular exercise routine.  Enjoys some usual interests and activities. Single. Lives alone. No family local.  Appetite decreased. Weight gain - 180  pounds. Reports sleep as consistent. Averages 5 to 6 hours - using CPAP nightly. Denies daytime napping. Focus and concentration is "not the best, but ok". Completing some tasks - "sort of keeping up". Reports cooking some meals. Managing minimal aspects of household. Retired. Denies SI or HI.   Denies AH or VH. Denies self harm. Denies substance use.   Previous ECT treatment.   Review of Systems:  Review of Systems  Musculoskeletal:  Negative for gait problem.  Neurological:  Negative for tremors.  Psychiatric/Behavioral:         Please refer to HPI    Medications: I have reviewed the patient's current medications.  Current Outpatient Medications  Medication Sig Dispense Refill   atorvastatin (LIPITOR) 40 MG tablet TAKE ONE TABLET BY MOUTH AT BEDTIME 30 tablet 0   buPROPion (WELLBUTRIN XL) 150 MG 24 hr tablet Take 2 tablets (300 mg total) by mouth daily. 180 tablet 1   FLUoxetine (PROZAC) 20 MG capsule Take 1 capsule (20 mg total) by mouth daily. 90 capsule 1   Glycerin-Hypromellose-PEG 400 (VISINE DRY EYE OP) Place 1 drop into both eyes daily as needed (dry eyes).     hydrochlorothiazide (MICROZIDE) 12.5 MG capsule TAKE 1 CAPSULE BY MOUTH ONCE DAILY 90 capsule 0   hydrOXYzine (ATARAX) 25 MG tablet Take 1 tablet (25 mg total) by mouth at bedtime as needed. 90 tablet 1   losartan (COZAAR) 25 MG tablet TAKE ONE TABLET BY MOUTH DAILY 90 tablet 0   pantoprazole (PROTONIX) 40 MG tablet Take 1 tablet (40 mg total) by mouth daily. 90 tablet 1   Probiotic Product (PROBIOTIC DAILY  PO) Take 1 capsule by mouth daily.     QUEtiapine (SEROQUEL) 25 MG tablet Take 1 tablet (25 mg total) by mouth at bedtime. 90 tablet 1   traZODone (DESYREL) 100 MG tablet Take two tablets at bedtime. 180 tablet 1   No current facility-administered medications for this visit.    Medication Side Effects: None  Allergies:  Allergies  Allergen Reactions   Nexium [Esomeprazole] Diarrhea    Past Medical  History:  Diagnosis Date   Depression    GERD (gastroesophageal reflux disease)    Hyperlipidemia    Hypertension     Family History  Problem Relation Age of Onset   Depression Mother    Cancer Father    Depression Brother     Social History   Socioeconomic History   Marital status: Single    Spouse name: Not on file   Number of children: 0   Years of education: Not on file   Highest education level: Doctorate  Occupational History   Occupation: Retired [employer] Employee  Tobacco Use   Smoking status: Former   Smokeless tobacco: Never  Advertising account planner   Vaping status: Never Used  Substance and Sexual Activity   Alcohol use: Not Currently   Drug use: Never   Sexual activity: Not on file  Other Topics Concern   Not on file  Social History Narrative   Not on file   Social Drivers of Health   Financial Resource Strain: Low Risk  (02/26/2021)   Overall Financial Resource Strain (CARDIA)    Difficulty of Paying Living Expenses: Not hard at all  Food Insecurity: No Food Insecurity (02/26/2021)   Hunger Vital Sign    Worried About Running Out of Food in the Last Year: Never true    Ran Out of Food in the Last Year: Never true  Transportation Needs: No Transportation Needs (02/26/2021)   PRAPARE - Administrator, Civil Service (Medical): No    Lack of Transportation (Non-Medical): No  Physical Activity: Sufficiently Active (02/26/2021)   Exercise Vital Sign    Days of Exercise per Week: 7 days    Minutes of Exercise per Session: 30 min  Stress: No Stress Concern Present (02/26/2021)   [employer] of Occupational Health - Occupational Stress Questionnaire    Feeling of Stress : Not at all  Social Connections: Unknown (08/06/2021)   Received from [facility]   Social Network    Social Network: Not on file  Intimate Partner Violence: Unknown (06/28/2021)   Received from [facility], [facility]   HITS    Physically Hurt: Not on file     Insult or Talk Down To: Not on file    Threaten Physical Harm: Not on file    Scream or Curse: Not on file    Past Medical History, Surgical history, Social history, and Family history were reviewed and updated as appropriate.   Please see review of systems for further details on the patient's review from today.   Objective:   Physical Exam:  There were no vitals taken for this visit.  Physical Exam Constitutional:      General: She is not in acute distress. Musculoskeletal:        General: No deformity.  Neurological:     Mental Status: She is alert and oriented to person, place, and time.     Coordination: Coordination normal.  Psychiatric:        Attention and Perception: Attention and  perception normal. She does not perceive auditory or visual hallucinations.        Mood and Affect: Mood normal. Mood is not anxious or depressed. Affect is not labile, blunt, angry or inappropriate.        Speech: Speech normal.        Behavior: Behavior normal.        Thought Content: Thought content normal. Thought content is not paranoid or delusional. Thought content does not include homicidal or suicidal ideation. Thought content does not include homicidal or suicidal plan.        Cognition and Memory: Cognition and memory normal.        Judgment: Judgment normal.     Comments: Insight intact     Lab Review:     Component Value Date/Time   NA 140 08/13/2021 0905   K 4.6 08/13/2021 0905   CL 104 08/13/2021 0905   CO2 30 08/13/2021 0905   GLUCOSE 100 (H) 08/13/2021 0905   BUN 22 08/13/2021 0905   BUN 15 07/06/2019 0000   CREATININE 0.85 08/13/2021 0905   CALCIUM 9.3 08/13/2021 0905   PROT 6.8 08/13/2021 0905   ALBUMIN 4.0 08/13/2021 0905   AST 19 08/13/2021 0905   ALT 14 08/13/2021 0905   ALKPHOS 48 08/13/2021 0905   BILITOT 0.4 08/13/2021 0905   GFRNONAA >60 06/04/2021 1154       Component Value Date/Time   WBC 5.8 08/13/2021 0905   RBC 4.22 08/13/2021 0905   HGB 13.7  08/13/2021 0905   HCT 40.7 08/13/2021 0905   PLT 303.0 08/13/2021 0905   MCV 96.5 08/13/2021 0905   MCH 31.9 06/04/2021 1154   MCHC 33.7 08/13/2021 0905   RDW 13.8 08/13/2021 0905   LYMPHSABS 0.7 08/13/2021 0905   MONOABS 0.3 08/13/2021 0905   EOSABS 0.2 08/13/2021 0905   BASOSABS 0.1 08/13/2021 0905    No results found for: "POCLITH", "LITHIUM"   No results found for: "PHENYTOIN", "PHENOBARB", "VALPROATE", "CBMZ"   .res Assessment: Plan:    Plan:  Seeing [name] for therapy   PDMP reviewed  Continue:  Trazadone 100mg  - take one tablet at bedtime for sleep Seroquel 25mg  at hs  Hydroxyzine 25mg  - 1 at hs for sleep   Prozac 40mg  daily Wellbutrin XL 150mg  every morning - denies seizure history   Consider Spravato - starting in January  Melatonin 5mg  at hs  Discussed NAC tablets and Magnesium L-threonate.  Followed by [name] - therapy   RTC 4 weeks   Patient advised to contact office with any questions, adverse effects, or acute worsening in signs and symptoms.  Discussed potential metabolic side effects associated with atypical antipsychotics, as well as potential risk for movement side effects. Advised pt to contact office if movement side effects occur.  There are no diagnoses linked to this encounter.   Please see After Visit Summary for patient specific instructions.  Future Appointments  Date Time Provider Department Center  03/11/2023 11:40 AM Darica Goren, Thereasa Solo, NP [facility] None  03/31/2023 10:00 AM Robley Fries, PhD [facility] None  05/01/2023 11:00 AM Robley Fries, PhD [facility] None    No orders of the defined types were placed in this encounter.     -------------------------------

## 2023-03-20 DIAGNOSIS — G4733 Obstructive sleep apnea (adult) (pediatric): Secondary | ICD-10-CM | POA: Diagnosis not present

## 2023-03-30 ENCOUNTER — Other Ambulatory Visit: Payer: Self-pay | Admitting: Internal Medicine

## 2023-03-30 ENCOUNTER — Telehealth: Payer: Self-pay

## 2023-03-30 MED ORDER — ATORVASTATIN CALCIUM 40 MG PO TABS: 40.0000 mg | ORAL_TABLET | Freq: Every day | ORAL | 0 refills | Status: DC

## 2023-03-30 NOTE — Telephone Encounter (Signed)
 Copied from CRM [id number]. Topic: Clinical - Medication Refill >> Mar 30, 2023 11:47 AM Isabell A wrote: Most Recent Primary Care Visit:  Provider: LUKE CHIQUITA SAUNDERS  Department: [facility]  Visit Type: MEDICARE AWV, SEQUENTIAL  Date: 01/01/2023  Medication: atorvastatin  (LIPITOR) 40 MG tablet   Has the patient contacted their pharmacy? Yes (Agent: If no, request that the patient contact the pharmacy for the refill. If patient does not wish to contact the pharmacy document the reason why and proceed with request.) (Agent: If yes, when and what did the pharmacy advise?) Pharmacy states it has been denied.   Is this the correct pharmacy for this prescription? Yes If no, delete pharmacy and type the correct one.  This is the patient's preferred pharmacy:  [facility] [place], [place] - [address] [place] [address] Phone: [phone] Fax: [phone]  [id number] - [place], [place] - [address] [place] [address] Phone: [phone] Fax: [phone]   Has the prescription been filled recently?   Is the patient out of the medication?   Has the patient been seen for an appointment in the last year OR does the patient have an upcoming appointment?   Can we respond through MyChart?   Agent: Please be advised that Rx refills may take up to 3 business days. We ask that you follow-up with your pharmacy.

## 2023-03-30 NOTE — Telephone Encounter (Signed)
 Copied from CRM [id number]. Topic: Clinical - Medication Refill >> Mar 30, 2023 11:47 AM Isabell A wrote: Most Recent Primary Care Visit:  Provider: LUKE CHIQUITA SAUNDERS  Department: [facility]  Visit Type: MEDICARE AWV, SEQUENTIAL  Date: 01/01/2023  Medication: atorvastatin  (LIPITOR) 40 MG tablet   Has the patient contacted their pharmacy? Yes (Agent: If no, request that the patient contact the pharmacy for the refill. If patient does not wish to contact the pharmacy document the reason why and proceed with request.) (Agent: If yes, when and what did the pharmacy advise?) Pharmacy states it has been denied.   Is this the correct pharmacy for this prescription? Yes If no, delete pharmacy and type the correct one.  This is the patient's preferred pharmacy:  [facility] [place], [place] - [address] [place] [address] Phone: [phone] Fax: [phone]  Has the prescription been filled recently? Yes  Is the patient out of the medication? No  Has the patient been seen for an appointment in the last year OR does the patient have an upcoming appointment? Yes  Can we respond through MyChart? No  Agent: Please be advised that Rx refills may take up to 3 business days. We ask that you follow-up with your pharmacy.

## 2023-03-31 ENCOUNTER — Ambulatory Visit: Payer: Medicare PPO | Admitting: Psychiatry

## 2023-03-31 DIAGNOSIS — R69 Illness, unspecified: Secondary | ICD-10-CM

## 2023-03-31 DIAGNOSIS — F5104 Psychophysiologic insomnia: Secondary | ICD-10-CM

## 2023-03-31 DIAGNOSIS — Z9189 Other specified personal risk factors, not elsewhere classified: Secondary | ICD-10-CM | POA: Diagnosis not present

## 2023-03-31 DIAGNOSIS — F332 Major depressive disorder, recurrent severe without psychotic features: Secondary | ICD-10-CM | POA: Diagnosis not present

## 2023-03-31 IMAGING — DX DG CHEST 1V PORT
2 series · 2 of 2 positions shown · non-contrast
Comparison: 09/22/2008

CLINICAL DATA: Medical clearance for psychiatric admission.

EXAM:
PORTABLE CHEST 1 VIEW

[chest ap (1 of 2)]
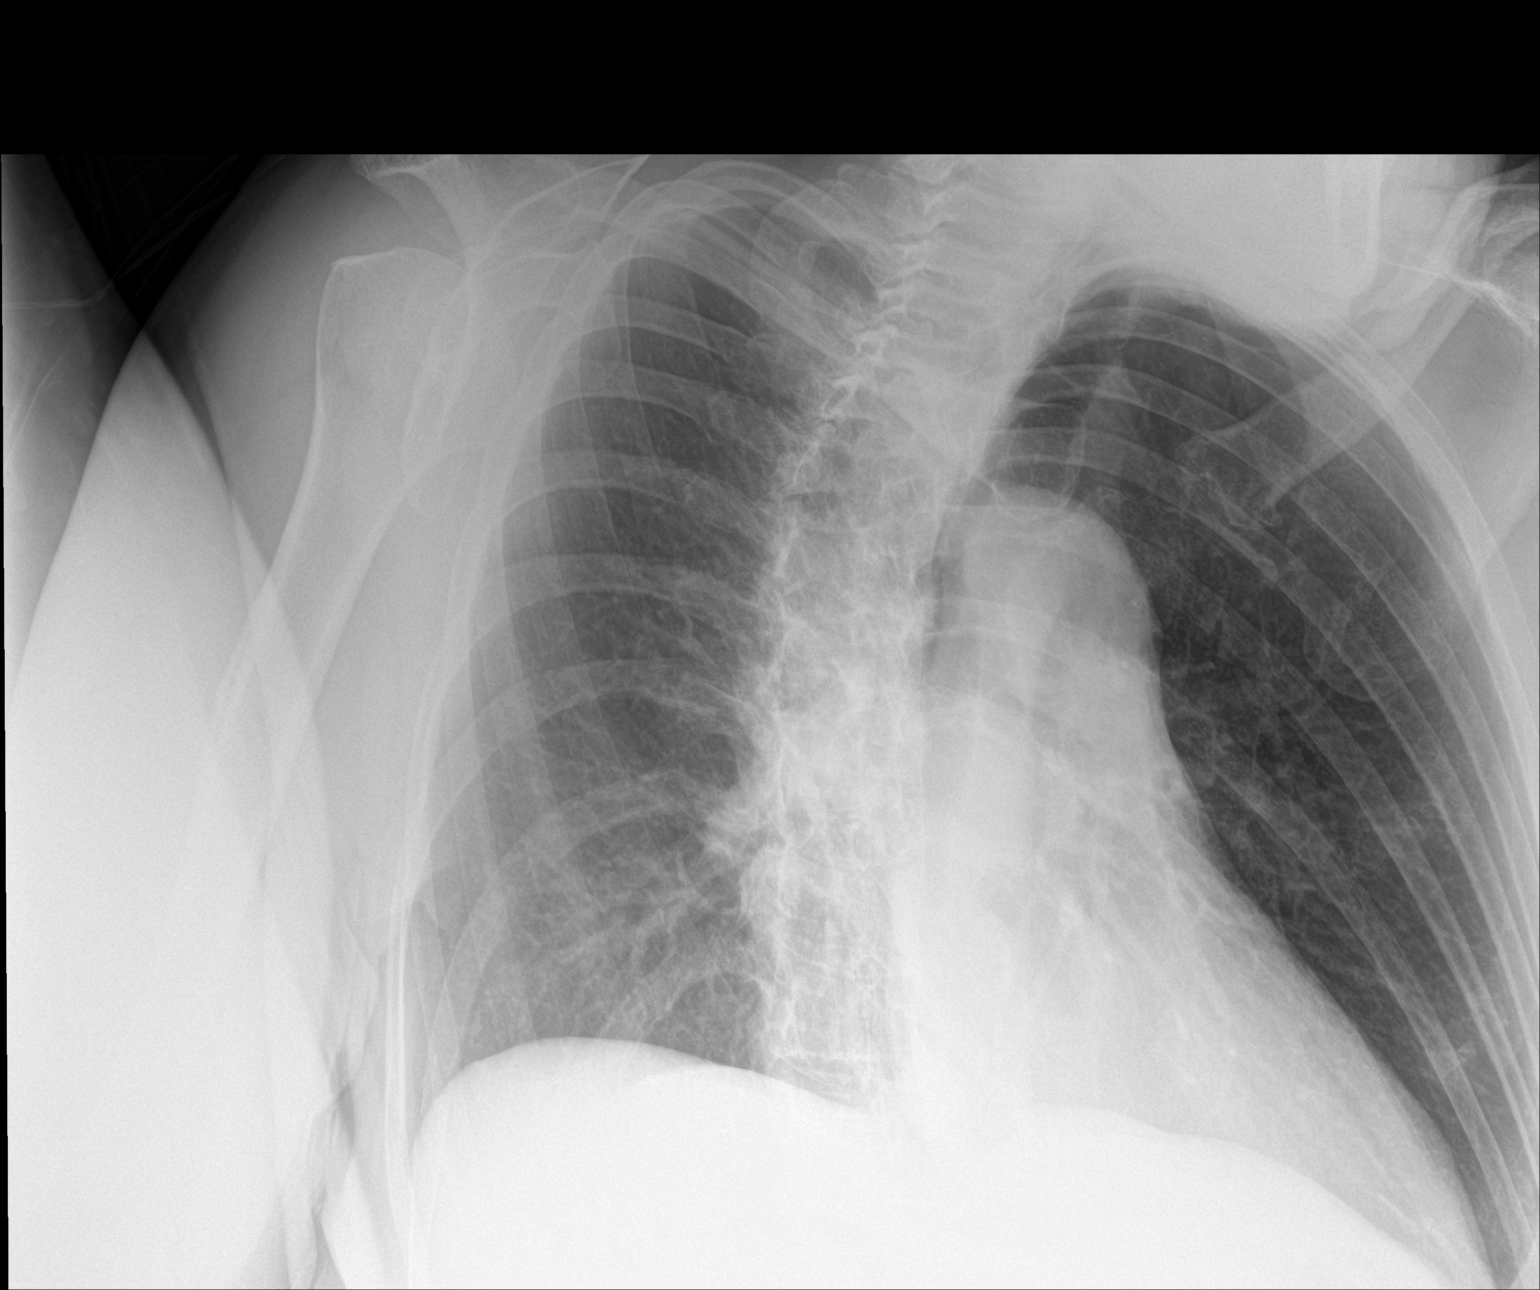

[chest ap (2 of 2)]
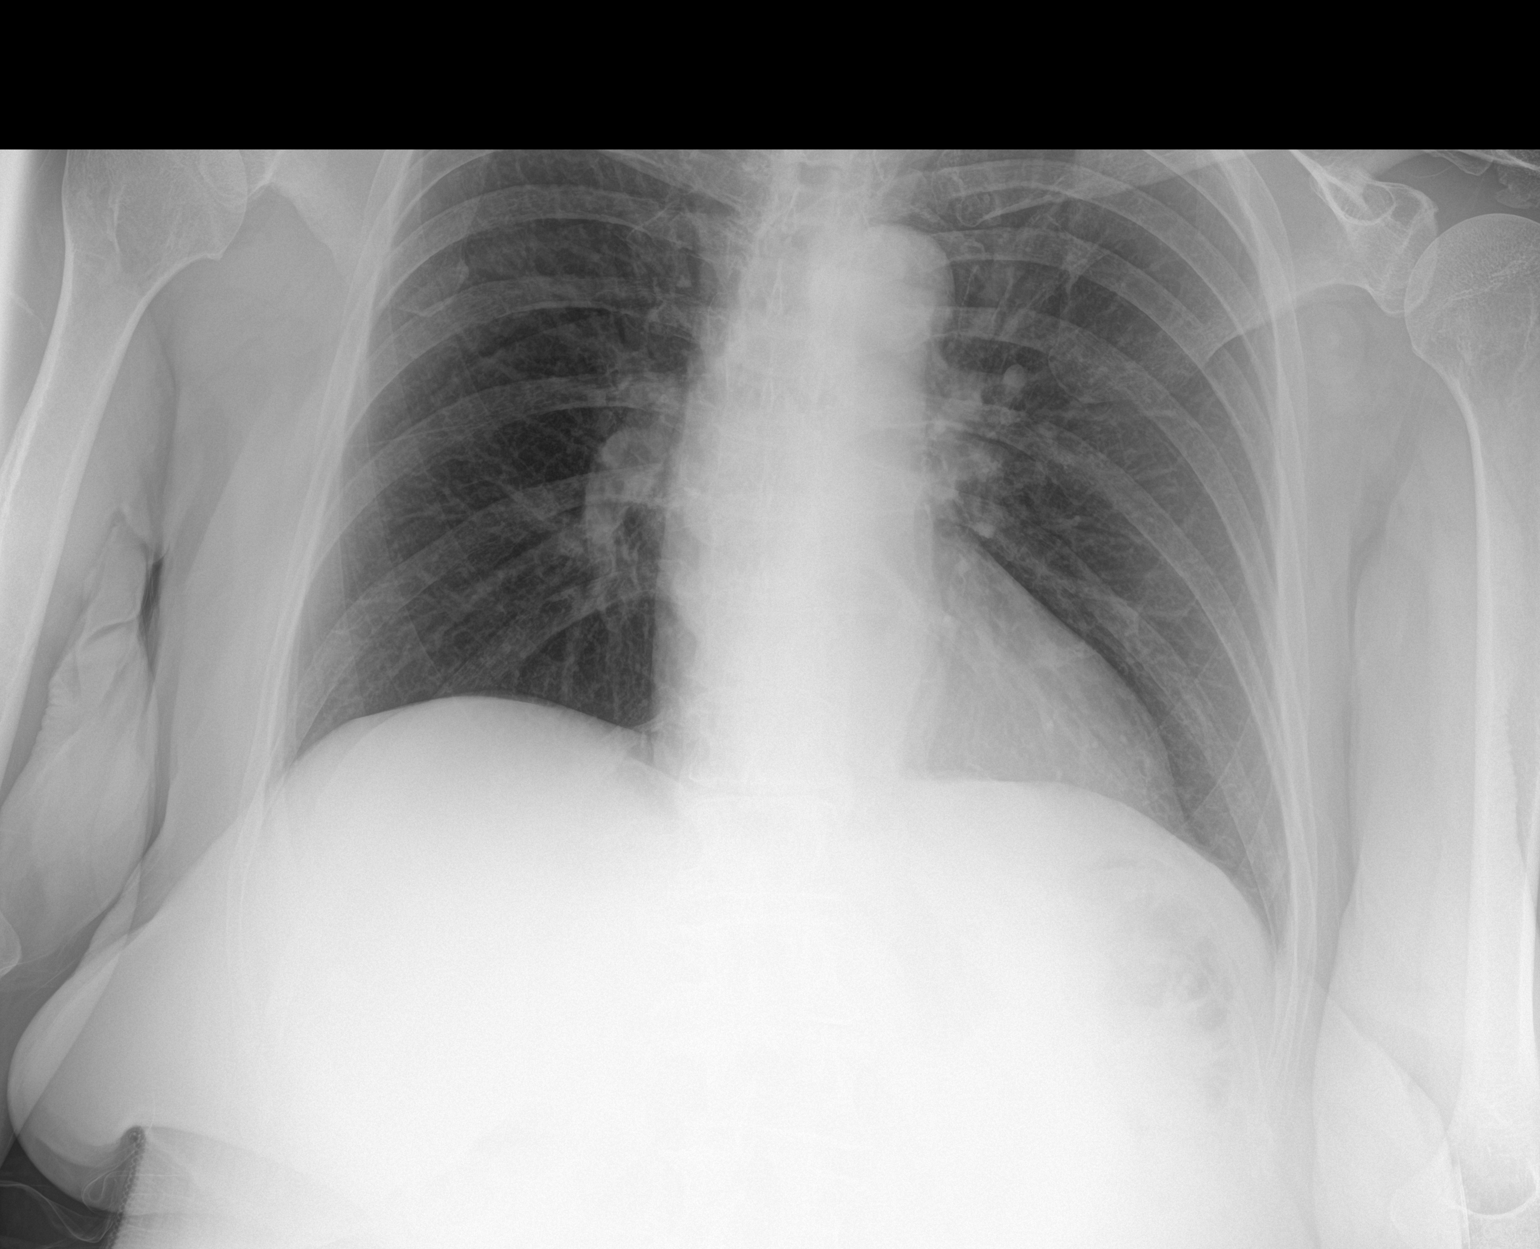

[2 of 2 positions shown; findings below may reference images not displayed]

FINDINGS: The heart size and mediastinal contours are within normal limits.
Both lungs are clear. The visualized skeletal structures are
unremarkable.
IMPRESSION: No active disease.

## 2023-03-31 NOTE — Telephone Encounter (Signed)
 Refill sent 03/30/23.

## 2023-03-31 NOTE — Progress Notes (Signed)
 Psychotherapy Progress Note [facility] [name], PhD LP  Patient ID: Jamie Gamble [facility])    MRN: [id number] Therapy format: Individual psychotherapy Date: 03/31/2023      Start: 10:13a     Stop: 10:19a    with Pt Total Time Spent including consulting other providers: 30 Location: Telehealth visit -- I connected with this patient by an approved telecommunication method (video), with her informed consent, and verifying identity and patient privacy.  I was located at my office and patient at her home.  As needed, we discussed the limitations, risks, and security and privacy concerns associated with telehealth service, including the availability and conditions which currently govern in-person appointments and the possibility that 3rd-party payment may not be fully guaranteed and she may be responsible for charges.  After she indicated understanding, we proceeded with the session.  Also discussed treatment planning, as needed, including ongoing verbal agreement with the plan, the opportunity to ask and answer all questions, her demonstrated understanding of instructions, and her readiness to call the office should symptoms worsen or she feels she is in a crisis state and needs more immediate and tangible assistance.   Session narrative (presenting needs, interim history, self-report of stressors and symptoms, applications of prior therapy, status changes, and interventions made in session) Very reluctant to meet today, says she slept very poorly last night, would really like to have a prayer said for her and try to nap.  Compassionately allowed, after checking a few necessaries -- not suicidal, not wasting, sill in touch with friend Arlean, continuing consent to consult with Arlean (though does not want a particular outreach made just now), just wants to feel better.  Per her shared faith, and seeing the value of something more chaplain-like today, offered prayer requesting God's care,  guidance, and courage to see and take hold of actions she can best take after she rests.    After contact, messaged nurse re Spravato  and psychiatrist about treatment strategy, with communications to come.  Learned Pt is presently in Spravato  authorization process, recommend conferring with her friend and POA, [name] Rummer, if any risk of poor comprehension or judgment at this time.    January learned of Spravato  scheduled.  Therapeutic modalities: Cognitive Behavioral Therapy, Solution-Oriented/Positive Psychology, Ego-Supportive, Faith-sensitive, and consulting treatment team  Mental Status/Observations:  Appearance:   wearied      Behavior:  Appropriate  Motor:  Normal  Speech/Language:   Clear and Coherent  Affect:  Appropriate  Mood:  Very tired  Thought process:  normal  Thought content:    Preocc with lack of sleep  Sensory/Perceptual disturbances:    grossly intact  Orientation:  grossly intact  Attention:  grossly intact    Concentration:  grossly intact  Memory:  grossly intact  Insight:    grossly intact  Judgment:   grossly intact  Impulse Control:  grossly intact   Risk Assessment: Danger to Self: No Self-injurious Behavior:  not acutely; general risk of wasting Danger to Others: No Physical Aggression / Violence: No Duty to Warn: not acutely, no criteria for APS involvement, but possible cause to confer with support system Access to Firearms a concern: No  Assessment of progress:   deteriorated  Diagnosis:   ICD-10-CM   1. Severe episode of recurrent major depressive disorder, without psychotic features (HCC)  F33.2     2. Psychophysiological insomnia  F51.04     3. r/o Avoidant Personality  R69     4. At  increased risk for social isolation  Z91.89      Plan:  Safety & welfare plan -- Continue pledge of no harm, see one or the other mental health provider at least 2/month until returning to normal activity outside the home.  Alyse Caldron remains POA and  cleared to confer PRN.  Recommend all treatment team members stand willing to contact her if any indications of poor safety, poor comprehension, or poor judgment. Still priority resolve to Continue supplements -- NAC regularly in the morning, Mg L-theonate regularly in the evening Make a daily practice of seeing some sunlight in the morning Try to get out for something, not just have it all delivered Try to allow a neighbor to interact briefly, and say so if she'd like to get back home, trust it's not rude but honest Notice and dispute I have to, but I can't thinking wrestling with tasks -- either determine I don't have to after all, or I actually can do a little something Notice any difference these make, however small Prayer tone -- Asking God to take away depression is honest, but asking help seeing better things and raising stronger responses is more constructive Behavioral/social activation -- advise be in touch with Arlean, try to contact [employer] friends, and entertain asking someone to come over or get out with them somehow Sleep regulation -- Should continue orange glasses QPM to facilitate circadian signaling, sleep readiness, and sleep hardiness.  Best recommendation dosing sleep-assisting meds up to 1 hr before bed.  Option add melatonin 2-5mg  if needed 1-2 hr ahead of bed.  Practice sleep hygiene, including comfortable arrangements, lights reduced, and all caffeine stopped well ahead of bedtime (at least 6 hrs).  Use soothing imagery PRN for DFA.  Should adhere to CPAP and ensure proper maintenance.  For worries, option write down worry thoughts and consciously decide whether to stay up and think or lay them down and sleep.  Acknowledge and table worry about whether she will sleep -- trust the natural drive for sleep to come around, and if too wakeful, get out of bed to do something not too stimulating.  Trust also any uptick in dreams as natural progression of catching up sleep  deficits and processing emotional life issues. Physiological mood factors -- Start the day with good lighting, movement/stretching, and a few minutes grounding, outdoors if available/desired.  Vitamin levels are normal, but improved B12 and D could still be helpful.  Should get some moderate exercise as able, even just walking and stretching, to combat depressogenic inactivity effects. Self-esteem and intrusive guilt/shame thoughts -- PRN practices of blessing-counting and listing ways people have told/shown her she is lovable/capable.  No requirement to believe or agree, just consider.  Seek to notice and give self credit when she does anything constructive or principled for things that matter to her wellbeing or sense of purpose.  As needed, self-affirm God would not be punishing her, it's a trick of depression to think it. Activities and socialization -- Develop activities of interest.  Self-monitor for broad demotivation.  Known options in [employer], [employer], and church activities at 3 locations, and ranging from chair yoga to entertainment/fellowship group to more theological and philosophical study.  Maintain/restore system of friends beyond [place], which may include anyone available at housing community.  Dispute worry thoughts of being judged, enter with permission to feel not that up to it.  [employer] can be available for supportive calls, assistance services, and possible facility-based recreation.  Home care companions when  needed.  Self-monitor for impulse to more fully isolate, and in all involvements practice permission to be authentic about her tolerance for spending time in view or the presence of others.  OK with working, if regains interest. Financial concerns -- Follow through fact-finding and meeting with advisor, in coordination with friend/POA Arlean.  Still OK to seek part-time work she can reach from her home.  Maintain cordial working relationship with nephew/trustee  Bonnie. Placement concerns -- Independent living with PRN assistance should be fine for the foreseeable future.  Available on request to interpret her condition to family members and work out concerns. Specialized therapies -- ECT on hold, has cognitively recovered for months, and the idea of shock therapy (and anesthesia) is highly anxiety-provoking to her.  Pt understands and accepts the burden to notify treatment team promptly of relapse in dark/dreadful mood, severe demotivation, or intractable insomnia.  Option Spravato , defer to psychiatry. Medical procedure anxiety -- Ask all necessary questions of health care, make sure to adequately comprehend recommendations rather than catastrophize without knowing.  For any procedure accepted, imagine the competence and caring of health care personnel in action, especially if it involves anesthesia and other vulnerable-making procedures. Weight management -- Concur with modified Weight Watchers approach and improved food choices at her interest.  Make sure to stay mobile, watch portions, limit carbs.  May observe practice of leaving a bite, work up concentration of foods contained in the MIND Diet. Other recommendations/advice -- As may be noted above.  Continue to utilize previously learned skills ad lib. Medication compliance -- Maintain medication as prescribed and work faithfully with relevant prescriber(s) if any changes are desired or seem indicated. Crisis service -- Aware of call list and work-in appts.  Call the clinic on-call service, 988/hotline, 911, or present to [facility] or ER if any life-threatening psychiatric crisis. Followup -- Return for time as already scheduled, avail earlier @ PT's need.  Next scheduled visit with me 05/01/2023.  Next scheduled in this office 04/13/2023.  Lamar Kendall, PhD [name], PhD LP Clinical Psychologist, [facility] [address] [place],  [place] [address] [phone]

## 2023-04-01 ENCOUNTER — Other Ambulatory Visit: Payer: Self-pay

## 2023-04-01 DIAGNOSIS — G4733 Obstructive sleep apnea (adult) (pediatric): Secondary | ICD-10-CM | POA: Diagnosis not present

## 2023-04-01 MED ORDER — SPRAVATO (56 MG DOSE) 28 MG/DEVICE NA SOPK: 56.0000 mg | PACK | NASAL | 1 refills | Status: DC

## 2023-04-07 ENCOUNTER — Encounter: Payer: Self-pay | Admitting: Adult Health

## 2023-04-07 ENCOUNTER — Ambulatory Visit (INDEPENDENT_AMBULATORY_CARE_PROVIDER_SITE_OTHER): Payer: Medicare PPO | Admitting: Adult Health

## 2023-04-07 ENCOUNTER — Ambulatory Visit: Payer: Medicare PPO

## 2023-04-07 VITALS — BP 125/87 | HR 74

## 2023-04-07 DIAGNOSIS — F339 Major depressive disorder, recurrent, unspecified: Secondary | ICD-10-CM

## 2023-04-07 NOTE — Progress Notes (Signed)
 NURSES NOTE:         Pt arrived for her 1st Spravato  Treatment for treatment resistant depression, the starting dose is 56 mg (2 of the 28 mg nasal sprays) as tolerated and no side effects. She is a patient of Angeline Sayers, DNP so she will follow her throughout her treatments and follow ups. Pt arrived with her friend, [name] and she was very helpful with discussing her treatments. Explained to pt how the treatments would be scheduled and answered any questions she or [name] had today. She was given a practice nasal trainer to try a few times before given the one with medication. She verbalized understanding. It was a bit difficult for her but I believe she is okay to get the medication. Pt's Spravato  is ordered through [facility].  Spravato  medication is stored at treatment center per REMS/FDA guidelines. The medication is required to be locked behind two doors per REMS/FDA protocol. Medication is also disposed of properly after each use per regulations. All documentation for REMS is completed and submitted per FDA/REMS requirements.          Began taking patient's vital signs at 11:15 AM 129/84, pulse 72. Pt was escorted to the bathroom prior to getting started. After returning pt asked for medication for nausea, this was discussed over the phone if she felt like she needed it. Pt given Zofran 4 mg SL prior to first nasal spray. Instructed patient to blow her nose if needed then recline back to a 45 degree angle. Gave patient first dose 28 mg nasal spray, administered in each nostril as directed and observed by nurse, waited 5 more minutes for the second dose. After both doses given pt did not complain of any nausea/vomiting, pt did have a drink to help with the taste of Spravato  it gives the metallic taste. Pt's friend, [name] stayed with her, pt was very anxious and nervous. Pt had noticeable shaking. Pt asked for some chips that was offered earlier. Pt sat up in recliner eating chips while her friend was  speaking to her for reassurance. Assessed her 40 minute vitals, 12:20 PM, 125/87, pulse 74. Pt reports doing fine, no complaints voiced. Pt appeared to be less anxious. Explained she would be monitored for a total time of 120 minutes. Discharge vitals were taken at 12:10 PM 115/72, P 78. Regina kept checking in with pt several times during treatment since she was so anxious and this was her first one. She felt better when she left and communicated much better as opposed to when she first came in for the treatment. Recommend she go home and sleep or just relax on the couch. No driving, no intense activities. Verbalized understanding. Pt. will be receiving 2 treatments per week for 4 weeks as recommended. Nurse was with pt a total of 60 minutes for clinical assessment. Pt is scheduled this Thursday, January 16th. Instructed to call with any issues.     LOT [id number] EXP APR 2027

## 2023-04-07 NOTE — Progress Notes (Signed)
 Jamie Gamble [id number] 08/26/1945 78 y.o.  Subjective:   Patient ID:  Jamie Gamble is a 78 y.o. (DOB Apr 07, 1945) female.  Chief Complaint: No chief complaint on file.   HPI Jamie Gamble presents to the office today for follow-up of treatment resistant depression (TRD).   Spravato  treatment #1  Accompanied by a friend.   Patient was administered Spravato  56 mg intranasally today. Patient was observed by provider throughout Spravato  treatment. The patient experienced the typical dissociation which gradually resolved over the 2-hour period of observation. There were no complications. Specifically, the patient did not have any untoward side effects - feeling disconnected from themself, their thoughts, feelings and things around them, dizziness, nausea, feeling sleepy, decreased feeling of sensitivity (numbness) spinning sensation, feeling anxious, lack of energy, increased blood pressure, feeling happy or very excited, or headache. Blood pressures remained within normal ranges at the 40-minute and 2-hour follow-up intervals. By the time the 2-hour observation period was met the patient was alert and oriented and able to exit without assistance. Patient willing to continue Spravato  administration for the treatment of resistant depression. See nursing note for further details.   Review of Systems:  Review of Systems  Musculoskeletal:  Negative for gait problem.  Neurological:  Negative for tremors.  Psychiatric/Behavioral:         Please refer to HPI    Medications: I have reviewed the patient's current medications.  Current Outpatient Medications  Medication Sig Dispense Refill   atorvastatin  (LIPITOR) 40 MG tablet Take 1 tablet (40 mg total) by mouth at bedtime. 30 tablet 0   atorvastatin  (LIPITOR) 40 MG tablet TAKE ONE TABLET BY MOUTH AT BEDTIME 30 tablet 0   buPROPion  (WELLBUTRIN  XL) 150 MG 24 hr tablet Take 2 tablets (300 mg total) by mouth daily. 180 tablet 1   Esketamine HCl, 56 MG  Dose, (SPRAVATO , 56 MG DOSE,) 28 MG/DEVICE SOPK Place 56 mg into the nose every 3 (three) days. 2 each 1   FLUoxetine  (PROZAC ) 20 MG capsule Take 1 capsule (20 mg total) by mouth daily. 90 capsule 1   Glycerin-Hypromellose-PEG 400 (VISINE DRY EYE OP) Place 1 drop into both eyes daily as needed (dry eyes).     hydrochlorothiazide  (MICROZIDE ) 12.5 MG capsule TAKE 1 CAPSULE BY MOUTH ONCE DAILY 90 capsule 0   hydrOXYzine  (ATARAX ) 25 MG tablet Take 1 tablet (25 mg total) by mouth at bedtime as needed. 90 tablet 1   losartan  (COZAAR ) 25 MG tablet TAKE ONE TABLET BY MOUTH DAILY 90 tablet 0   pantoprazole  (PROTONIX ) 40 MG tablet Take 1 tablet (40 mg total) by mouth daily. 90 tablet 1   Probiotic Product (PROBIOTIC DAILY PO) Take 1 capsule by mouth daily.     QUEtiapine  (SEROQUEL ) 25 MG tablet Take 1 tablet (25 mg total) by mouth at bedtime. 90 tablet 1   traZODone  (DESYREL ) 100 MG tablet Take two tablets at bedtime. 180 tablet 1   No current facility-administered medications for this visit.    Medication Side Effects: None  Allergies:  Allergies  Allergen Reactions   Esomeprazole Diarrhea and Other (See Comments)    Past Medical History:  Diagnosis Date   Depression    GERD (gastroesophageal reflux disease)    Hyperlipidemia    Hypertension     Past Medical History, Surgical history, Social history, and Family history were reviewed and updated as appropriate.   Please see review of systems for further details on the patient's review from today.  Objective:   Physical Exam:  There were no vitals taken for this visit.  Physical Exam Constitutional:      General: She is not in acute distress. Chest:     Chest wall: No tenderness.  Musculoskeletal:        General: No deformity.  Neurological:     Mental Status: She is alert and oriented to person, place, and time.     Coordination: Coordination normal.  Psychiatric:        Attention and Perception: Attention and perception  normal. She does not perceive auditory or visual hallucinations.        Mood and Affect: Mood is depressed. Affect is not labile, blunt, angry or inappropriate.        Speech: Speech normal.        Behavior: Behavior normal.        Thought Content: Thought content normal. Thought content is not paranoid or delusional. Thought content does not include homicidal or suicidal ideation. Thought content does not include homicidal or suicidal plan.        Cognition and Memory: Cognition and memory normal.        Judgment: Judgment normal.     Comments: Insight intact     Lab Review:     Component Value Date/Time   NA 140 08/13/2021 0905   K 4.6 08/13/2021 0905   CL 104 08/13/2021 0905   CO2 30 08/13/2021 0905   GLUCOSE 100 (H) 08/13/2021 0905   BUN 22 08/13/2021 0905   BUN 15 07/06/2019 0000   CREATININE 0.85 08/13/2021 0905   CALCIUM  9.3 08/13/2021 0905   PROT 6.8 08/13/2021 0905   ALBUMIN 4.0 08/13/2021 0905   AST 19 08/13/2021 0905   ALT 14 08/13/2021 0905   ALKPHOS 48 08/13/2021 0905   BILITOT 0.4 08/13/2021 0905   GFRNONAA >60 06/04/2021 1154       Component Value Date/Time   WBC 5.8 08/13/2021 0905   RBC 4.22 08/13/2021 0905   HGB 13.7 08/13/2021 0905   HCT 40.7 08/13/2021 0905   PLT 303.0 08/13/2021 0905   MCV 96.5 08/13/2021 0905   MCH 31.9 06/04/2021 1154   MCHC 33.7 08/13/2021 0905   RDW 13.8 08/13/2021 0905   LYMPHSABS 0.7 08/13/2021 0905   MONOABS 0.3 08/13/2021 0905   EOSABS 0.2 08/13/2021 0905   BASOSABS 0.1 08/13/2021 0905    No results found for: POCLITH, LITHIUM   No results found for: PHENYTOIN, PHENOBARB, VALPROATE, CBMZ   .res Assessment: Plan:    Expand All Collapse All Jamie Gamble [id number] 05/24/1953 78 y.o.   Subjective:    Patient ID:  Jamie Gamble is a 78 y.o. (DOB 05/24/1953) female.   Chief Complaint: No chief complaint on file.     HPI Jamie Gamble presents to the office today for follow-up of treatment resistant  depression (TRD).   Spravato  treatment    Patient was administered Spravato  84 mg intranasally today. Patient was observed by provider throughout Spravato  treatment. The patient experienced the typical dissociation which gradually resolved over the 2-hour period of observation. There were no complications. Specifically, the patient did not have any untoward side effects - feeling disconnected from themself, their thoughts, feelings and things around them, dizziness, nausea, feeling sleepy, decreased feeling of sensitivity (numbness) spinning sensation, feeling anxious, lack of energy, increased blood pressure, feeling happy or very excited, or headache. Blood pressures remained within normal ranges at the 40-minute and 2-hour follow-up intervals. By the time the 2-hour observation  period was met the patient was alert and oriented and able to exit without assistance. Patient willing to continue Spravato  administration for the treatment of resistant depression. See nursing note for further details.           Review of Systems:  Review of Systems  Musculoskeletal:  Negative for gait problem.  Neurological:  Negative for tremors.  Psychiatric/Behavioral:         Please refer to HPI      Medications: I have reviewed the patient's current medications.         Current Outpatient Medications  Medication Sig Dispense Refill   aspirin 81 MG chewable tablet Chew by mouth.       buPROPion  (WELLBUTRIN  SR) 150 MG 12 hr tablet Take 1 tablet by mouth daily.       Cholecalciferol 25 MCG (1000 UT) capsule Take by mouth.       desloratadine (CLARINEX) 5 MG tablet Take 1 tablet (5 mg total) by mouth daily. 90 tablet 3   Docusate Sodium (DSS) 100 MG CAPS Take by mouth.       Esketamine HCl, 84 MG Dose, (SPRAVATO , 84 MG DOSE,) 28 MG/DEVICE SOPK Place 84 mg into the nose every 3 (three) days. 3 each 5   fluticasone (FLONASE) 50 MCG/ACT nasal spray Place 2 sprays into both nostrils daily. 16 g 6   gabapentin  (NEURONTIN) 400 MG capsule Take by mouth once. Take 400 mg once a day       Hyoscyamine Sulfate (HYOSCYAMINE PO) Take by mouth. Take one tablet three times a day       LORazepam (ATIVAN) 1 MG tablet Take 1 tablet (1 mg total) by mouth every 6 (six) hours as needed. 120 tablet 0   Multiple Vitamin (THEREMS) TABS Take by mouth.       nitroGLYCERIN (NITROSTAT) 0.4 MG SL tablet Take by mouth.       OLANZapine (ZYPREXA) 5 MG tablet Take 1 tablet (5 mg total) by mouth at bedtime. 90 tablet 1   primidone (MYSOLINE) 50 MG tablet Take 2 tablets (100 mg total) by mouth 2 (two) times daily. 360 tablet 3   tamsulosin (FLOMAX) 0.4 MG CAPS capsule Take 0.4 mg by mouth.       traZODone  (DESYREL ) 50 MG tablet Take 50 mg by mouth at bedtime as needed.          No current facility-administered medications for this visit.        Medication Side Effects: None   Allergies:  Allergies       Allergies  Allergen Reactions   Doxycycline Rash      Other Reaction(s): red rash ankles   Penicillin G Rash   Penicillins Rash      PT UNSURE ABOUT TAKING KEFLEX            Past Medical History:  Diagnosis Date   Depression     Hypertension     Spine disorder     Tremor            Past Medical History, Surgical history, Social history, and Family history were reviewed and updated as appropriate.    Please see review of systems for further details on the patient's review from today.    Objective:    Physical Exam:  There were no vitals taken for this visit.   Physical Exam Constitutional:      General: He is not in acute distress. Musculoskeletal:  General: No deformity.  Neurological:     Mental Status: He is alert and oriented to person, place, and time.     Coordination: Coordination normal.  Psychiatric:        Attention and Perception: Attention and perception normal. He does not perceive auditory or visual hallucinations.        Mood and Affect: Affect is not labile, blunt, angry  or inappropriate.        Speech: Speech normal.        Behavior: Behavior normal.        Thought Content: Thought content normal. Thought content is not paranoid or delusional. Thought content does not include homicidal or suicidal ideation. Thought content does not include homicidal or suicidal plan.        Cognition and Memory: Cognition and memory normal.        Judgment: Judgment normal.     Comments: Insight intact        Lab Review:  Labs (Brief)  No results found for: NA, K, CL, CO2, GLUCOSE, BUN, CREATININE, CALCIUM , PROT, ALBUMIN, AST, ALT, ALKPHOS, BILITOT, GFRNONAA, GFRAA     Labs (Brief)  No results found for: WBC, RBC, HGB, HCT, PLT, MCV, MCH, MCHC, RDW, LYMPHSABS, MONOABS, EOSABS, BASOSABS     Last Labs  No results found for: POCLITH, LITHIUM      Recent Labs  No results found for: PHENYTOIN, PHENOBARB, VALPROATE, CBMZ      .res Assessment: Plan:      Recommendations/Plan   Continue Spravato  treatment.   Patient advised to contact office with any questions, adverse effects, or acute worsening in signs and symptoms.        There are no diagnoses linked to this encounter.   Please see After Visit Summary for patient specific instructions.  Future Appointments  Date Time Provider Department Center  04/09/2023 11:00 AM CP-NURSE [facility] None  04/09/2023 11:00 AM Loredana Medellin Nattalie, NP [facility] None  04/13/2023 11:00 AM Mercer Stallworth Nattalie, NP [facility] None  05/01/2023 11:00 AM Mitchum, Lamar, PhD [facility] None    No orders of the defined types were placed in this encounter.   -------------------------------

## 2023-04-08 ENCOUNTER — Other Ambulatory Visit: Payer: Self-pay | Admitting: Adult Health

## 2023-04-08 NOTE — Telephone Encounter (Signed)
 We have to see how pt is doing before ordering more

## 2023-04-09 ENCOUNTER — Ambulatory Visit: Payer: Medicare PPO

## 2023-04-09 ENCOUNTER — Ambulatory Visit (INDEPENDENT_AMBULATORY_CARE_PROVIDER_SITE_OTHER): Payer: Medicare PPO | Admitting: Adult Health

## 2023-04-09 ENCOUNTER — Other Ambulatory Visit: Payer: Self-pay

## 2023-04-09 ENCOUNTER — Encounter: Payer: Self-pay | Admitting: Adult Health

## 2023-04-09 VITALS — BP 109/78 | HR 73

## 2023-04-09 DIAGNOSIS — F339 Major depressive disorder, recurrent, unspecified: Secondary | ICD-10-CM

## 2023-04-09 NOTE — Progress Notes (Signed)
NURSES NOTE:         Pt arrived for her 2nd Spravato Treatment for treatment resistant depression, the starting dose is 56 mg (2 of the 28 mg nasal sprays) as tolerated and no side effects. No issues since her first treatment so she will receive 56 mg Spravato again today. She is a patient of Yvette Rack, DNP so she will follow her throughout her treatments and follow ups. Pt arrived with her friend, [name] and she was very helpful with discussing her treatments. Explained to pt how the treatments would be scheduled and answered any questions she or [name] had today.  It was a bit difficult for her but I believe she is okay to get the medication. Pt's Spravato is ordered through [facility].  Spravato medication is stored at treatment center per REMS/FDA guidelines. The medication is required to be locked behind two doors per REMS/FDA protocol. Medication is also disposed of properly after each use per regulations. All documentation for REMS is completed and submitted per FDA/REMS requirements.          Brought pt back to treatment room, [name] was parking car while we walked back. Began taking patient's vital signs at 11:05 AM 117/84, pulse 78. Noticed pt is wearing the exact outfit as on her 1st treatment. Mentioned this to pt and she responded with yes. Offered pt Zofran again to help with any nausea and she agreed. Pt given Zofran 4 mg SL prior to first nasal spray. Instructed patient to blow her nose if needed then recline back to a 45 degree angle. Gave patient first dose 28 mg nasal spray, pt tried to administer in each nostril as directed and observed by nurse, but she had more difficulty and couldn't figure out how to hold it like she should. Offered to help but she did it anyway. Not sure pt received all medication because it looked like it came out of her nose and she grabbed a tissue because it was dripping down. Reclined pt back further and insturced her she would have to hold her head back more.  Waited 5 more minutes for the second dose. Before giving second dose I gave pt another trainer to practice with since the first one didn't go in real well. She practiced once and didn't want to do anymore. I suggest she use one hand for device then she could hold her other nostril close with the other hand to make sure she received the medication good but she did not want to do that.  After both doses given pt did not complain of any nausea/vomiting, pt did have a drink to help with the taste of Spravato it gives the metallic taste. Pt's friend, [name] stayed with her, pt was less anxious today. Pt asked for some chips. After she ate those then assessed her 40 minute vitals, 11:55 PM, 120/79, pulse 70. Pt reports doing fine, no complaints voiced. Pt appeared to be less anxious. Explained she would be monitored for a total time of 120 minutes. Discharge vitals were taken at 1:00 PM 109/78, P 73. Regina kept checking in with pt several times during treatment. Pt didn't interact quite as much as last time afterwards but better than when she arrived.  Recommend she go home and sleep or just relax on the couch. No driving, no intense activities. Verbalized understanding. Pt. will be receiving 2 treatments per week for 4 weeks as recommended. Nurse was with pt a total of 60 minutes for clinical assessment.  Pt is scheduled next Tuesday and Thursday. Instructed to call with any issues.  Spravato 56 mg ordered for next 2 treatments.   LOT [id number] EXP APR 2027

## 2023-04-09 NOTE — Progress Notes (Signed)
Jamie Gamble [id number] Sep 29, 1945 78 y.o.  Subjective:   Patient ID:  Jamie Gamble is a 78 y.o. (DOB Jul 23, 1945) female.  Chief Complaint: No chief complaint on file.   HPI KIMBERLY FLOW presents to the office today for follow-up of  treatment resistant depression (TRD).   Spravato treatment #2  Accompanied by a friend.   Patient was administered Spravato 56 mg intranasally today. Patient was observed by provider throughout [employer] treatment. The patient experienced the typical dissociation which gradually resolved over the 2-hour period of observation. There were no complications. Specifically, the patient did not have any untoward side effects - feeling disconnected from themself, their thoughts, feelings and things around them, dizziness, nausea, feeling sleepy, decreased feeling of sensitivity (numbness) spinning sensation, feeling anxious, lack of energy, increased blood pressure, feeling happy or very excited, or headache. Blood pressures remained within normal ranges at the 40-minute and 2-hour follow-up intervals. By the time the 2-hour observation period was met the patient was alert and oriented and able to exit without assistance. Patient willing to continue Spravato administration for the treatment of resistant depression. See nursing note for further details.   PHQ2-9    Flowsheet Row Counselor from 12/25/2022 in [facility] Video Visit from 02/11/2022 in [facility] [place] Counselor from 11/28/2021 in [facility] Office Visit from 08/13/2021 in [facility] [place] Office Visit from 04/04/2021 in [facility] Total Score 6 0 2 0 0  PHQ-9 Total Score 14 4 7  0 --      Flowsheet Row ED from 06/04/2021 in [facility] Emergency Department at [facility]  C-SSRS RISK CATEGORY No Risk        Review of Systems:  Review of Systems   Musculoskeletal:  Negative for gait problem.  Neurological:  Negative for tremors.  Psychiatric/Behavioral:         Please refer to HPI    Medications: I have reviewed the patient's current medications.  Current Outpatient Medications  Medication Sig Dispense Refill   atorvastatin (LIPITOR) 40 MG tablet Take 1 tablet (40 mg total) by mouth at bedtime. 30 tablet 0   atorvastatin (LIPITOR) 40 MG tablet TAKE ONE TABLET BY MOUTH AT BEDTIME 30 tablet 0   buPROPion (WELLBUTRIN XL) 150 MG 24 hr tablet Take 2 tablets (300 mg total) by mouth daily. 180 tablet 1   FLUoxetine (PROZAC) 20 MG capsule Take 1 capsule (20 mg total) by mouth daily. 90 capsule 1   Glycerin-Hypromellose-PEG 400 (VISINE DRY EYE OP) Place 1 drop into both eyes daily as needed (dry eyes).     hydrochlorothiazide (MICROZIDE) 12.5 MG capsule TAKE 1 CAPSULE BY MOUTH ONCE DAILY 90 capsule 0   hydrOXYzine (ATARAX) 25 MG tablet Take 1 tablet (25 mg total) by mouth at bedtime as needed. 90 tablet 1   losartan (COZAAR) 25 MG tablet TAKE ONE TABLET BY MOUTH DAILY 90 tablet 0   pantoprazole (PROTONIX) 40 MG tablet Take 1 tablet (40 mg total) by mouth daily. 90 tablet 1   Probiotic Product (PROBIOTIC DAILY PO) Take 1 capsule by mouth daily.     QUEtiapine (SEROQUEL) 25 MG tablet Take 1 tablet (25 mg total) by mouth at bedtime. 90 tablet 1   SPRAVATO, 56 MG DOSE, 28 MG/DEVICE SOPK USE 2 SPRAY IN EACH NOSTRIL EVERY 3 DAYS 2 each 1   traZODone (DESYREL) 100 MG tablet Take two  tablets at bedtime. 180 tablet 1   No current facility-administered medications for this visit.    Medication Side Effects: None  Allergies:  Allergies  Allergen Reactions   Esomeprazole Diarrhea and Other (See Comments)    Past Medical History:  Diagnosis Date   Depression    GERD (gastroesophageal reflux disease)    Hyperlipidemia    Hypertension     Past Medical History, Surgical history, Social history, and Family history were reviewed and updated  as appropriate.   Please see review of systems for further details on the patient's review from today.   Objective:   Physical Exam:  There were no vitals taken for this visit.  Physical Exam Constitutional:      General: She is not in acute distress. Musculoskeletal:        General: No deformity.  Neurological:     Mental Status: She is alert and oriented to person, place, and time.     Coordination: Coordination normal.  Psychiatric:        Attention and Perception: Attention and perception normal. She does not perceive auditory or visual hallucinations.        Mood and Affect: Mood is depressed. Affect is not labile, blunt, angry or inappropriate.        Speech: Speech normal.        Behavior: Behavior normal.        Thought Content: Thought content normal. Thought content is not paranoid or delusional. Thought content does not include homicidal or suicidal ideation. Thought content does not include homicidal or suicidal plan.        Cognition and Memory: Cognition and memory normal.        Judgment: Judgment normal.     Comments: Insight intact     Lab Review:     Component Value Date/Time   NA 140 08/13/2021 0905   K 4.6 08/13/2021 0905   CL 104 08/13/2021 0905   CO2 30 08/13/2021 0905   GLUCOSE 100 (H) 08/13/2021 0905   BUN 22 08/13/2021 0905   BUN 15 07/06/2019 0000   CREATININE 0.85 08/13/2021 0905   CALCIUM 9.3 08/13/2021 0905   PROT 6.8 08/13/2021 0905   ALBUMIN 4.0 08/13/2021 0905   AST 19 08/13/2021 0905   ALT 14 08/13/2021 0905   ALKPHOS 48 08/13/2021 0905   BILITOT 0.4 08/13/2021 0905   GFRNONAA >60 06/04/2021 1154       Component Value Date/Time   WBC 5.8 08/13/2021 0905   RBC 4.22 08/13/2021 0905   HGB 13.7 08/13/2021 0905   HCT 40.7 08/13/2021 0905   PLT 303.0 08/13/2021 0905   MCV 96.5 08/13/2021 0905   MCH 31.9 06/04/2021 1154   MCHC 33.7 08/13/2021 0905   RDW 13.8 08/13/2021 0905   LYMPHSABS 0.7 08/13/2021 0905   MONOABS 0.3 08/13/2021  0905   EOSABS 0.2 08/13/2021 0905   BASOSABS 0.1 08/13/2021 0905    No results found for: "POCLITH", "LITHIUM"   No results found for: "PHENYTOIN", "PHENOBARB", "VALPROATE", "CBMZ"   .res Assessment: Plan:    There are no diagnoses linked to this encounter.   Please see After Visit Summary for patient specific instructions.  Future Appointments  Date Time Provider Department Center  04/13/2023 11:00 AM Kendyn Zaman, Thereasa Solo, NP CP-CP None  05/01/2023 11:00 AM Robley Fries, PhD CP-CP None    No orders of the defined types were placed in this encounter.   -------------------------------

## 2023-04-09 NOTE — Telephone Encounter (Signed)
Please send

## 2023-04-10 ENCOUNTER — Telehealth: Payer: Self-pay | Admitting: Adult Health

## 2023-04-10 NOTE — Telephone Encounter (Signed)
Pt called asking for an increase on her prozac and needs something to help with her anxiety before she does spravato. Pharmacy is [facility]

## 2023-04-13 ENCOUNTER — Telehealth: Payer: Medicare PPO | Admitting: Adult Health

## 2023-04-13 NOTE — Progress Notes (Signed)
No charge - Spravato patient.

## 2023-04-14 ENCOUNTER — Other Ambulatory Visit: Payer: Self-pay

## 2023-04-14 ENCOUNTER — Ambulatory Visit: Payer: Medicare PPO

## 2023-04-14 ENCOUNTER — Encounter: Payer: Self-pay | Admitting: Adult Health

## 2023-04-14 ENCOUNTER — Ambulatory Visit (INDEPENDENT_AMBULATORY_CARE_PROVIDER_SITE_OTHER): Payer: Medicare PPO | Admitting: Adult Health

## 2023-04-14 VITALS — BP 136/84 | HR 65

## 2023-04-14 DIAGNOSIS — F339 Major depressive disorder, recurrent, unspecified: Secondary | ICD-10-CM

## 2023-04-14 MED ORDER — SPRAVATO (56 MG DOSE) 28 MG/DEVICE NA SOPK: 56.0000 mg | PACK | NASAL | 1 refills | Status: DC

## 2023-04-14 NOTE — Progress Notes (Signed)
NURSES NOTE:         Pt arrived for her 3rd Spravato Treatment for treatment resistant depression, the starting dose is 56 mg (2 of the 28 mg nasal sprays) as tolerated. No issues since her first treatment so she will receive 56 mg Spravato again today. She is a patient of Yvette Rack, DNP so she will follow her throughout her treatments and follow ups. Pt arrived with her friend, [name] and she was very helpful with discussing her treatments. Explained to pt how the treatments would be scheduled and answered any questions she or [name] had today.  It was a bit difficult for her but I believe she is okay to get the medication. Pt's Spravato is ordered through [employer].  Spravato medication is stored at treatment center per REMS/FDA guidelines. The medication is required to be locked behind two doors per REMS/FDA protocol. Medication is also disposed of properly after each use per regulations. All documentation for REMS is completed and submitted per FDA/REMS requirements.          Brought pt back to treatment room, [name] was with pt today as well. Began taking patient's vital signs at 10:40 AM 131/83, pulse 88. Offered pt Zofran again to help with any nausea and she declined today to see how she did. Instructed patient to blow her nose if needed then recline back to a 45 degree angle. Pt given trainer again to go over how to administer the nasal spray to make sure she received all the medication. She was able to perform so she was given first dose 28 mg nasal spray, pt tried to administer in each nostril as directed and observed by nurse. Waited 5 more minutes for the second dose. After both doses given pt did not complain of any nausea/vomiting, pt did have a drink to help with the taste of Spravato it gives the metallic taste. Pt's friend, [name] stayed with her, pt was less anxious today. Pt asked for some chips as well. Pt's 40 minute vitals, 11:22 AM, 127/79, pulse 69. Pt reports doing fine, no  complaints voiced. Pt appeared to be less anxious. Explained she would be monitored for a total time of 120 minutes. Discharge vitals were taken at 12:35 AM 136/84, P 65. Rene Kocher discussed treatment with her today and also her oral medications.  Recommend she go home and sleep or just relax on the couch. No driving, no intense activities. Verbalized understanding. Pt. will be receiving 2 treatments per week for 4 weeks as recommended. Nurse was with pt a total of 60 minutes for clinical assessment. Pt is scheduled Thursday. Instructed to call with any issues.  Spravato 56 mg ordered for next 2 treatments.   LOT [id number] EXP APR 2027

## 2023-04-14 NOTE — Progress Notes (Signed)
Jamie Gamble [id number] January 24, 1946 78 y.o.  Subjective:   Patient ID:  Jamie Gamble is a 78 y.o. (DOB [birth year]) female.  Chief Complaint: No chief complaint on file.   HPI Jamie Gamble presents to the office today for follow-up of treatment resistant depression (TRD).   Spravato treatment #3  Accompanied by a friend.   Patient was administered Spravato 56 mg intranasally today. Patient was observed by provider throughout [facility] treatment. The patient experienced the typical dissociation which gradually resolved over the 2-hour period of observation. There were no complications. Specifically, the patient did not have any untoward side effects - feeling disconnected from themself, their thoughts, feelings and things around them, dizziness, nausea, feeling sleepy, decreased feeling of sensitivity (numbness) spinning sensation, feeling anxious, lack of energy, increased blood pressure, feeling happy or very excited, or headache. Blood pressures remained within normal ranges at the 40-minute and 2-hour follow-up intervals. By the time the 2-hour observation period was met the patient was alert and oriented and able to exit without assistance. Patient willing to continue Spravato administration for the treatment of resistant depression. See nursing note for further details.    PHQ2-9    Flowsheet Row Counselor from 12/25/2022 in [facility] Video Visit from 02/11/2022 in [facility] [place] Counselor from 11/28/2021 in [facility] Office Visit from 08/13/2021 in [facility] [place] Office Visit from 04/04/2021 in [facility] Total Score 6 0 2 0 0  PHQ-9 Total Score 14 4 7  0 --      Flowsheet Row ED from 06/04/2021 in [facility] Emergency Department at [facility]  C-SSRS RISK CATEGORY No Risk        Review of Systems:  Review of  Systems  Musculoskeletal:  Negative for gait problem.  Neurological:  Negative for tremors.  Psychiatric/Behavioral:         Please refer to HPI    Medications: I have reviewed the patient's current medications.  Current Outpatient Medications  Medication Sig Dispense Refill   atorvastatin (LIPITOR) 40 MG tablet Take 1 tablet (40 mg total) by mouth at bedtime. 30 tablet 0   atorvastatin (LIPITOR) 40 MG tablet TAKE ONE TABLET BY MOUTH AT BEDTIME 30 tablet 0   buPROPion (WELLBUTRIN XL) 150 MG 24 hr tablet Take 2 tablets (300 mg total) by mouth daily. 180 tablet 1   FLUoxetine (PROZAC) 20 MG capsule Take 1 capsule (20 mg total) by mouth daily. 90 capsule 1   Glycerin-Hypromellose-PEG 400 (VISINE DRY EYE OP) Place 1 drop into both eyes daily as needed (dry eyes).     hydrochlorothiazide (MICROZIDE) 12.5 MG capsule TAKE 1 CAPSULE BY MOUTH ONCE DAILY 90 capsule 0   hydrOXYzine (ATARAX) 25 MG tablet Take 1 tablet (25 mg total) by mouth at bedtime as needed. 90 tablet 1   losartan (COZAAR) 25 MG tablet TAKE ONE TABLET BY MOUTH DAILY 90 tablet 0   pantoprazole (PROTONIX) 40 MG tablet Take 1 tablet (40 mg total) by mouth daily. 90 tablet 1   Probiotic Product (PROBIOTIC DAILY PO) Take 1 capsule by mouth daily.     QUEtiapine (SEROQUEL) 25 MG tablet Take 1 tablet (25 mg total) by mouth at bedtime. 90 tablet 1   SPRAVATO, 56 MG DOSE, 28 MG/DEVICE SOPK USE 2 SPRAY IN EACH NOSTRIL EVERY 3 DAYS 2 each 1   traZODone (DESYREL) 100 MG tablet Take two  tablets at bedtime. 180 tablet 1   No current facility-administered medications for this visit.    Medication Side Effects: None  Allergies:  Allergies  Allergen Reactions   Esomeprazole Diarrhea and Other (See Comments)    Past Medical History:  Diagnosis Date   Depression    GERD (gastroesophageal reflux disease)    Hyperlipidemia    Hypertension     Past Medical History, Surgical history, Social history, and Family history were reviewed and  updated as appropriate.   Please see review of systems for further details on the patient's review from today.   Objective:   Physical Exam:  There were no vitals taken for this visit.  Physical Exam Constitutional:      General: She is not in acute distress. Musculoskeletal:        General: No deformity.  Neurological:     Mental Status: She is alert and oriented to person, place, and time.     Coordination: Coordination normal.  Psychiatric:        Attention and Perception: Attention and perception normal. She does not perceive auditory or visual hallucinations.        Mood and Affect: Affect is not labile, blunt, angry or inappropriate.        Speech: Speech normal.        Behavior: Behavior normal.        Thought Content: Thought content normal. Thought content is not paranoid or delusional. Thought content does not include homicidal or suicidal ideation. Thought content does not include homicidal or suicidal plan.        Cognition and Memory: Cognition and memory normal.        Judgment: Judgment normal.     Comments: Insight intact     Lab Review:     Component Value Date/Time   NA 140 08/13/2021 0905   K 4.6 08/13/2021 0905   CL 104 08/13/2021 0905   CO2 30 08/13/2021 0905   GLUCOSE 100 (H) 08/13/2021 0905   BUN 22 08/13/2021 0905   BUN 15 07/06/2019 0000   CREATININE 0.85 08/13/2021 0905   CALCIUM 9.3 08/13/2021 0905   PROT 6.8 08/13/2021 0905   ALBUMIN 4.0 08/13/2021 0905   AST 19 08/13/2021 0905   ALT 14 08/13/2021 0905   ALKPHOS 48 08/13/2021 0905   BILITOT 0.4 08/13/2021 0905   GFRNONAA >60 06/04/2021 1154       Component Value Date/Time   WBC 5.8 08/13/2021 0905   RBC 4.22 08/13/2021 0905   HGB 13.7 08/13/2021 0905   HCT 40.7 08/13/2021 0905   PLT 303.0 08/13/2021 0905   MCV 96.5 08/13/2021 0905   MCH 31.9 06/04/2021 1154   MCHC 33.7 08/13/2021 0905   RDW 13.8 08/13/2021 0905   LYMPHSABS 0.7 08/13/2021 0905   MONOABS 0.3 08/13/2021 0905    EOSABS 0.2 08/13/2021 0905   BASOSABS 0.1 08/13/2021 0905    No results found for: "POCLITH", "LITHIUM"   No results found for: "PHENYTOIN", "PHENOBARB", "VALPROATE", "CBMZ"   .res Assessment: Plan:    Recommendations/Plan   Continue Spravato treatment.   Patient advised to contact office with any questions, adverse effects, or acute worsening in signs and symptoms.  Diagnoses and all orders for this visit:  Recurrent major depression resistant to treatment [facility])     Please see After Visit Summary for patient specific instructions.  Future Appointments  Date Time Provider Department Center  04/16/2023 10:15 AM CP-NURSE CP-CP None  04/16/2023 10:15 AM Honestie Kulik, Rene Kocher  Wilhemina Bonito, NP CP-CP None  05/01/2023 11:00 AM Mitchum, Robert, PhD CP-CP None    No orders of the defined types were placed in this encounter.   -------------------------------

## 2023-04-16 ENCOUNTER — Ambulatory Visit: Payer: Medicare PPO

## 2023-04-16 ENCOUNTER — Encounter: Payer: Self-pay | Admitting: Adult Health

## 2023-04-16 ENCOUNTER — Other Ambulatory Visit: Payer: Self-pay | Admitting: Adult Health

## 2023-04-16 ENCOUNTER — Ambulatory Visit (INDEPENDENT_AMBULATORY_CARE_PROVIDER_SITE_OTHER): Payer: Medicare PPO | Admitting: Adult Health

## 2023-04-16 VITALS — BP 134/79 | HR 73

## 2023-04-16 DIAGNOSIS — F339 Major depressive disorder, recurrent, unspecified: Secondary | ICD-10-CM

## 2023-04-16 DIAGNOSIS — F411 Generalized anxiety disorder: Secondary | ICD-10-CM

## 2023-04-16 MED ORDER — ALPRAZOLAM 0.25 MG PO TABS: 0.2500 mg | ORAL_TABLET | Freq: Every day | ORAL | 0 refills | Status: DC | PRN

## 2023-04-16 MED ORDER — FLUOXETINE HCL 20 MG PO CAPS: 20.0000 mg | ORAL_CAPSULE | Freq: Every day | ORAL | 2 refills | Status: DC

## 2023-04-16 NOTE — Progress Notes (Signed)
NURSES NOTE:         Pt arrived for her 4th Spravato Treatment for treatment resistant depression, the starting dose is 56 mg (2 of the 28 mg nasal sprays) as tolerated. No issues since her first treatment so she will receive 56 mg Spravato again today. She is a patient of Yvette Rack, DNP so she will follow her throughout her treatments and follow ups. Pt arrived with her friend, [name] and she was very helpful with discussing her treatments. Explained to pt how the treatments would be scheduled and answered any questions she or [name] had today.  It was a bit difficult for her but I believe she is okay to get the medication. Pt's Spravato is ordered through [employer].  Spravato medication is stored at treatment center per REMS/FDA guidelines. The medication is required to be locked behind two doors per REMS/FDA protocol. Medication is also disposed of properly after each use per regulations. All documentation for REMS is completed and submitted per FDA/REMS requirements.          Brought pt back to treatment room, [name] was with pt today as well. Pt wanted to go to the bathroom prior to getting her vital signs. Spoke with [name] while pt in bathroom and she reports pt is very anxious today and is worried she is going to have dissociation. Began taking patient's vital signs at 10:35 AM 145/90, pulse 81. Pt was given Xanax 0.25 mg to help with her anxiety. Instructed patient to blow her nose if needed then recline back to a 45 degree angle. Pt given trainer again to go over how to administer the nasal spray to make sure she received all the medication. Pt did not respond when asked if she was ready to get her first dose, pt very worried about the dissociation. Discussed at length how dissociation could be and asked if she had experienced it before, she didn't respond. Informed her I would let the Xanax work before getting started. She was ready about 40-45 minutes after arriving. She was given first dose 28  mg nasal spray, pt tried to administer in each nostril as directed and observed by nurse. Waited 5 more minutes for the second dose. After both doses given pt did not complain of any nausea/vomiting, pt did have a drink to help with the taste of Spravato it gives the metallic taste. Pt's friend, [name] stayed with her today and talked to her to help alleviate her anxiety. Pt asked for some chips as well. Pt's 40 minute vitals, 12:00 PM, 120/85, pulse 75. Pt reports doing fine, no complaints voiced. Pt appeared to be less anxious. Explained she would be monitored for a total time of 120 minutes. Discharge vitals were taken at 1:17 PM 134/79, P 73. Rene Kocher discussed treatment with her today and also her oral medications.  Recommend she go home and sleep or just relax on the couch. No driving, no intense activities. Verbalized understanding. Pt. will be receiving 2 treatments per week for 4 weeks as recommended. Nurse was with pt a total of 60 minutes for clinical assessment. Pt is scheduled next Tuesday and Thursday. Instructed to call with any issues.  Spravato 56 mg ordered for next 2 treatments.   LOT [id number] EXP APR 2027

## 2023-04-16 NOTE — Progress Notes (Signed)
Jamie Gamble [id number] 1945/06/04 78 y.o.  Subjective:   Patient ID:  Jamie Gamble is a 78 y.o. (DOB Aug 17, 1945) female.  Chief Complaint: No chief complaint on file.   HPI [name] presents to the office today for follow-up of treatment resistant depression (TRD).   Spravato treatment #4  Accompanied by a friend.   Patient was administered Spravato 56 mg intranasally today. Patient was observed by provider throughout [facility] treatment. The patient experienced the typical dissociation which gradually resolved over the 2-hour period of observation. There were no complications. Specifically, the patient did not have any untoward side effects - feeling disconnected from themself, their thoughts, feelings and things around them, dizziness, nausea, feeling sleepy, decreased feeling of sensitivity (numbness) spinning sensation, feeling anxious, lack of energy, increased blood pressure, feeling happy or very excited, or headache. Blood pressures remained within normal ranges at the 40-minute and 2-hour follow-up intervals. By the time the 2-hour observation period was met the patient was alert and oriented and able to exit without assistance. Patient willing to continue Spravato administration for the treatment of resistant depression. See nursing note for further details.    PHQ2-9    Flowsheet Row Counselor from 12/25/2022 in [facility] Video Visit from 02/11/2022 in [facility] [place] Counselor from 11/28/2021 in [facility] Office Visit from 08/13/2021 in [facility] [place] Office Visit from 04/04/2021 in [facility] Total Score 6 0 2 0 0  PHQ-9 Total Score 14 4 7  0 --      Flowsheet Row ED from 06/04/2021 in [facility] Emergency Department at [facility]  C-SSRS RISK CATEGORY No Risk        Review of Systems:  Review of  Systems  Musculoskeletal:  Negative for gait problem.  Neurological:  Negative for tremors.  Psychiatric/Behavioral:         Please refer to HPI    Medications: I have reviewed the patient's current medications.  Current Outpatient Medications  Medication Sig Dispense Refill   ALPRAZolam (XANAX) 0.25 MG tablet Take 1 tablet (0.25 mg total) by mouth daily as needed for anxiety. 30 tablet 0   atorvastatin (LIPITOR) 40 MG tablet Take 1 tablet (40 mg total) by mouth at bedtime. 30 tablet 0   atorvastatin (LIPITOR) 40 MG tablet TAKE ONE TABLET BY MOUTH AT BEDTIME 30 tablet 0   buPROPion (WELLBUTRIN XL) 150 MG 24 hr tablet Take 2 tablets (300 mg total) by mouth daily. 180 tablet 1   Esketamine HCl, 56 MG Dose, (SPRAVATO, 56 MG DOSE,) 28 MG/DEVICE SOPK Place 56 mg into the nose every 3 (three) days. USE 2 SPRAY IN EACH NOSTRIL EVERY 3 DAYS 2 each 1   FLUoxetine (PROZAC) 20 MG capsule Take 1 capsule (20 mg total) by mouth daily. 90 capsule 1   FLUoxetine (PROZAC) 20 MG capsule Take 1 capsule (20 mg total) by mouth daily. 30 capsule 2   Glycerin-Hypromellose-PEG 400 (VISINE DRY EYE OP) Place 1 drop into both eyes daily as needed (dry eyes).     hydrochlorothiazide (MICROZIDE) 12.5 MG capsule TAKE 1 CAPSULE BY MOUTH ONCE DAILY 90 capsule 0   hydrOXYzine (ATARAX) 25 MG tablet Take 1 tablet (25 mg total) by mouth at bedtime as needed. 90 tablet 1   losartan (COZAAR) 25 MG tablet TAKE ONE TABLET BY MOUTH DAILY 90 tablet 0   pantoprazole (PROTONIX) 40 MG  tablet Take 1 tablet (40 mg total) by mouth daily. 90 tablet 1   Probiotic Product (PROBIOTIC DAILY PO) Take 1 capsule by mouth daily.     QUEtiapine (SEROQUEL) 25 MG tablet Take 1 tablet (25 mg total) by mouth at bedtime. 90 tablet 1   traZODone (DESYREL) 100 MG tablet Take two tablets at bedtime. 180 tablet 1   No current facility-administered medications for this visit.    Medication Side Effects: None  Allergies:  Allergies  Allergen  Reactions   Esomeprazole Diarrhea and Other (See Comments)    Past Medical History:  Diagnosis Date   Depression    GERD (gastroesophageal reflux disease)    Hyperlipidemia    Hypertension     Past Medical History, Surgical history, Social history, and Family history were reviewed and updated as appropriate.   Please see review of systems for further details on the patient's review from today.   Objective:   Physical Exam:  There were no vitals taken for this visit.  Physical Exam Constitutional:      General: She is not in acute distress. Musculoskeletal:        General: No deformity.  Neurological:     Mental Status: She is alert and oriented to person, place, and time.     Coordination: Coordination normal.  Psychiatric:        Attention and Perception: Attention and perception normal. She does not perceive auditory or visual hallucinations.        Mood and Affect: Affect is not labile, blunt, angry or inappropriate.        Speech: Speech normal.        Behavior: Behavior normal.        Thought Content: Thought content normal. Thought content is not paranoid or delusional. Thought content does not include homicidal or suicidal ideation. Thought content does not include homicidal or suicidal plan.        Cognition and Memory: Cognition and memory normal.        Judgment: Judgment normal.     Comments: Insight intact     Lab Review:     Component Value Date/Time   NA 140 08/13/2021 0905   K 4.6 08/13/2021 0905   CL 104 08/13/2021 0905   CO2 30 08/13/2021 0905   GLUCOSE 100 (H) 08/13/2021 0905   BUN 22 08/13/2021 0905   BUN 15 07/06/2019 0000   CREATININE 0.85 08/13/2021 0905   CALCIUM 9.3 08/13/2021 0905   PROT 6.8 08/13/2021 0905   ALBUMIN 4.0 08/13/2021 0905   AST 19 08/13/2021 0905   ALT 14 08/13/2021 0905   ALKPHOS 48 08/13/2021 0905   BILITOT 0.4 08/13/2021 0905   GFRNONAA >60 06/04/2021 1154       Component Value Date/Time   WBC 5.8 08/13/2021  0905   RBC 4.22 08/13/2021 0905   HGB 13.7 08/13/2021 0905   HCT 40.7 08/13/2021 0905   PLT 303.0 08/13/2021 0905   MCV 96.5 08/13/2021 0905   MCH 31.9 06/04/2021 1154   MCHC 33.7 08/13/2021 0905   RDW 13.8 08/13/2021 0905   LYMPHSABS 0.7 08/13/2021 0905   MONOABS 0.3 08/13/2021 0905   EOSABS 0.2 08/13/2021 0905   BASOSABS 0.1 08/13/2021 0905    No results found for: "POCLITH", "LITHIUM"   No results found for: "PHENYTOIN", "PHENOBARB", "VALPROATE", "CBMZ"   .res Assessment: Plan:    Recommendations/Plan   Continue Spravato treatment.   Patient advised to contact office with any questions, adverse effects, or  acute worsening in signs and symptoms.  There are no diagnoses linked to this encounter.   Please see After Visit Summary for patient specific instructions.  Future Appointments  Date Time Provider Department Center  05/01/2023 11:00 AM Robley Fries, PhD CP-CP None    No orders of the defined types were placed in this encounter.   -------------------------------

## 2023-04-21 ENCOUNTER — Encounter: Payer: Self-pay | Admitting: Adult Health

## 2023-04-21 ENCOUNTER — Ambulatory Visit (INDEPENDENT_AMBULATORY_CARE_PROVIDER_SITE_OTHER): Payer: Medicare PPO | Admitting: Adult Health

## 2023-04-21 ENCOUNTER — Ambulatory Visit: Payer: Medicare PPO

## 2023-04-21 VITALS — BP 115/74 | HR 76

## 2023-04-21 DIAGNOSIS — F339 Major depressive disorder, recurrent, unspecified: Secondary | ICD-10-CM | POA: Diagnosis not present

## 2023-04-21 NOTE — Progress Notes (Signed)
Jamie Gamble [id number] 1946/03/23 78 y.o.  Subjective:   Patient ID:  Jamie Gamble is a 78 y.o. (DOB [birth year]) female.  Chief Complaint: No chief complaint on file.   HPI CRYSTALE GIANNATTASIO presents to the office today for follow-up of treatment resistant depression (TRD).   Spravato treatment #5  Accompanied by a friend.   Patient was administered Spravato 56 mg intranasally today. Patient was observed by provider throughout [facility] treatment. The patient experienced the typical dissociation which gradually resolved over the 2-hour period of observation. There were no complications. Specifically, the patient did not have any untoward side effects - feeling disconnected from themself, their thoughts, feelings and things around them, dizziness, nausea, feeling sleepy, decreased feeling of sensitivity (numbness) spinning sensation, feeling anxious, lack of energy, increased blood pressure, feeling happy or very excited, or headache. Blood pressures remained within normal ranges at the 40-minute and 2-hour follow-up intervals. By the time the 2-hour observation period was met the patient was alert and oriented and able to exit without assistance. Patient willing to continue Spravato administration for the treatment of resistant depression. See nursing note for further details.    PHQ2-9    Flowsheet Row Counselor from 12/25/2022 in [facility] Video Visit from 02/11/2022 in [facility] [place] Counselor from 11/28/2021 in [facility] Office Visit from 08/13/2021 in [facility] [place] Office Visit from 04/04/2021 in [facility] Total Score 6 0 2 0 0  PHQ-9 Total Score 14 4 7  0 --      Flowsheet Row ED from 06/04/2021 in [facility] Emergency Department at [facility]  C-SSRS RISK CATEGORY No Risk        Review of Systems:  Review of  Systems  Musculoskeletal:  Negative for gait problem.  Neurological:  Negative for tremors.  Psychiatric/Behavioral:         Please refer to HPI    Medications: I have reviewed the patient's current medications.  Current Outpatient Medications  Medication Sig Dispense Refill   ALPRAZolam (XANAX) 0.25 MG tablet Take 1 tablet (0.25 mg total) by mouth daily as needed for anxiety. 30 tablet 0   atorvastatin (LIPITOR) 40 MG tablet Take 1 tablet (40 mg total) by mouth at bedtime. 30 tablet 0   atorvastatin (LIPITOR) 40 MG tablet TAKE ONE TABLET BY MOUTH AT BEDTIME 30 tablet 0   buPROPion (WELLBUTRIN XL) 150 MG 24 hr tablet Take 2 tablets (300 mg total) by mouth daily. 180 tablet 1   Esketamine HCl, 56 MG Dose, (SPRAVATO, 56 MG DOSE,) 28 MG/DEVICE SOPK Place 56 mg into the nose every 3 (three) days. USE 2 SPRAY IN EACH NOSTRIL EVERY 3 DAYS 2 each 1   FLUoxetine (PROZAC) 20 MG capsule Take 1 capsule (20 mg total) by mouth daily. 90 capsule 1   FLUoxetine (PROZAC) 20 MG capsule Take 1 capsule (20 mg total) by mouth daily. 30 capsule 2   Glycerin-Hypromellose-PEG 400 (VISINE DRY EYE OP) Place 1 drop into both eyes daily as needed (dry eyes).     hydrochlorothiazide (MICROZIDE) 12.5 MG capsule TAKE 1 CAPSULE BY MOUTH ONCE DAILY 90 capsule 0   hydrOXYzine (ATARAX) 25 MG tablet Take 1 tablet (25 mg total) by mouth at bedtime as needed. 90 tablet 1   losartan (COZAAR) 25 MG tablet TAKE ONE TABLET BY MOUTH DAILY 90 tablet 0   pantoprazole (PROTONIX) 40 MG  tablet Take 1 tablet (40 mg total) by mouth daily. 90 tablet 1   Probiotic Product (PROBIOTIC DAILY PO) Take 1 capsule by mouth daily.     QUEtiapine (SEROQUEL) 25 MG tablet Take 1 tablet (25 mg total) by mouth at bedtime. 90 tablet 1   traZODone (DESYREL) 100 MG tablet Take two tablets at bedtime. 180 tablet 1   No current facility-administered medications for this visit.    Medication Side Effects: None  Allergies:  Allergies  Allergen  Reactions   Esomeprazole Diarrhea and Other (See Comments)    Past Medical History:  Diagnosis Date   Depression    GERD (gastroesophageal reflux disease)    Hyperlipidemia    Hypertension     Past Medical History, Surgical history, Social history, and Family history were reviewed and updated as appropriate.   Please see review of systems for further details on the patient's review from today.   Objective:   Physical Exam:  There were no vitals taken for this visit.  Physical Exam Constitutional:      General: She is not in acute distress. Musculoskeletal:        General: No deformity.  Neurological:     Mental Status: She is alert and oriented to person, place, and time.     Coordination: Coordination normal.  Psychiatric:        Attention and Perception: Attention and perception normal. She does not perceive auditory or visual hallucinations.        Mood and Affect: Mood is depressed. Affect is not labile, blunt, angry or inappropriate.        Speech: Speech normal.        Behavior: Behavior normal.        Thought Content: Thought content normal. Thought content is not paranoid or delusional. Thought content does not include homicidal or suicidal ideation. Thought content does not include homicidal or suicidal plan.        Cognition and Memory: Cognition and memory normal.        Judgment: Judgment normal.     Comments: Insight intact     Lab Review:     Component Value Date/Time   NA 140 08/13/2021 0905   K 4.6 08/13/2021 0905   CL 104 08/13/2021 0905   CO2 30 08/13/2021 0905   GLUCOSE 100 (H) 08/13/2021 0905   BUN 22 08/13/2021 0905   BUN 15 07/06/2019 0000   CREATININE 0.85 08/13/2021 0905   CALCIUM 9.3 08/13/2021 0905   PROT 6.8 08/13/2021 0905   ALBUMIN 4.0 08/13/2021 0905   AST 19 08/13/2021 0905   ALT 14 08/13/2021 0905   ALKPHOS 48 08/13/2021 0905   BILITOT 0.4 08/13/2021 0905   GFRNONAA >60 06/04/2021 1154       Component Value Date/Time    WBC 5.8 08/13/2021 0905   RBC 4.22 08/13/2021 0905   HGB 13.7 08/13/2021 0905   HCT 40.7 08/13/2021 0905   PLT 303.0 08/13/2021 0905   MCV 96.5 08/13/2021 0905   MCH 31.9 06/04/2021 1154   MCHC 33.7 08/13/2021 0905   RDW 13.8 08/13/2021 0905   LYMPHSABS 0.7 08/13/2021 0905   MONOABS 0.3 08/13/2021 0905   EOSABS 0.2 08/13/2021 0905   BASOSABS 0.1 08/13/2021 0905    No results found for: "POCLITH", "LITHIUM"   No results found for: "PHENYTOIN", "PHENOBARB", "VALPROATE", "CBMZ"   .res Assessment: Plan:    Recommendations/Plan   Continue Spravato treatment.   Patient advised to contact office with any questions,  adverse effects, or acute worsening in signs and symptoms.  There are no diagnoses linked to this encounter.   Please see After Visit Summary for patient specific instructions.  Future Appointments  Date Time Provider Department Center  04/23/2023 10:00 AM CP-NURSE CP-CP None  04/23/2023 10:00 AM Charley Miske, Thereasa Solo, NP CP-CP None  05/01/2023 11:00 AM Robley Fries, PhD CP-CP None  06/10/2023 11:00 AM Robley Fries, PhD CP-CP None  07/13/2023 11:00 AM Robley Fries, PhD CP-CP None    No orders of the defined types were placed in this encounter.   -------------------------------

## 2023-04-21 NOTE — Progress Notes (Signed)
NURSES NOTE:         Pt arrived for her 5th Spravato Treatment for treatment resistant depression, the starting dose is 56 mg (2 of the 28 mg nasal sprays) as tolerated. She is a patient of Yvette Rack, DNP so she will follow her throughout her treatments and follow ups. Pt arrived with her friend, [name] and she was very helpful with discussing her treatments. Explained to pt how the treatments would be scheduled and answered any questions she or [name] had today.  It was a bit difficult for her but I believe she is okay to get the medication. Pt's Spravato is ordered through [employer].  Spravato medication is stored at treatment center per REMS/FDA guidelines. The medication is required to be locked behind two doors per REMS/FDA protocol. Medication is also disposed of properly after each use per regulations. All documentation for REMS is completed and submitted per FDA/REMS requirements.          Brought pt back to treatment room, [name] was with pt today as well but she needed to leave, she asks the pt to contact her once she is done with treatment in case she has not returned yet.  Pt wanted to go to the bathroom prior to getting her vital signs. Began taking patient's vital signs at 10:15 AM 137/85, pulse 121, grabbed pulse oximetry pulse was 107-109. Almira Coaster was aware of elevated pulse, pt reports taking Xanax 0.25 mg about 9:00 AM to help with her anxiety. Instructed patient to blow her nose if needed then recline back to a 45 degree angle. Pt given trainer again to go over how to administer the nasal spray to make sure she received all the medication. She was given first dose 28 mg nasal spray, pt tried to administer in each nostril as directed and observed by nurse. Pt given a bottle of water but I instructed her it was okay to have a mint or some water between doses. Waited 5 more minutes for the second dose. After both doses given pt did not complain of any nausea/vomiting. Pt immediately asked for  potato chips when she was done with both devices.  40 minute vitals, 11:03 AM, 118/76, pulse 81. Pt reports doing fine, no complaints voiced. Pt appeared to be less anxious. Explained she would be monitored for a total time of 120 minutes. Discharge vitals were taken at 12:00 PM 115/74, P 76. Rene Kocher discussed treatment with her today and also her oral medications.  Recommend she go home and sleep or just relax on the couch. No driving, no intense activities. Verbalized understanding. Pt. will be receiving 2 treatments per week for 4 weeks as recommended. Nurse was with pt a total of 60 minutes for clinical assessment. Pt is scheduled again this Thursday. Instructed to call with any issues.     LOT [id number] EXP APR 2027

## 2023-04-22 ENCOUNTER — Other Ambulatory Visit: Payer: Self-pay | Admitting: Adult Health

## 2023-04-23 ENCOUNTER — Ambulatory Visit (INDEPENDENT_AMBULATORY_CARE_PROVIDER_SITE_OTHER): Payer: Medicare PPO | Admitting: Adult Health

## 2023-04-23 ENCOUNTER — Ambulatory Visit: Payer: Medicare PPO

## 2023-04-23 ENCOUNTER — Encounter: Payer: Self-pay | Admitting: Adult Health

## 2023-04-23 VITALS — BP 120/84 | HR 75

## 2023-04-23 DIAGNOSIS — F339 Major depressive disorder, recurrent, unspecified: Secondary | ICD-10-CM

## 2023-04-23 NOTE — Progress Notes (Addendum)
[name] [id number] October 22, 1945 78 y.o.  Subjective:   Patient ID:  Jamie Gamble is a 78 y.o. (DOB [birth year]) female.  Chief Complaint: No chief complaint on file.   HPI Jamie Gamble presents to the office today for follow-up of treatment resistant depression (TRD).   Spravato treatment #6  Accompanied by a friend.   Patient was administered Spravato 56 mg intranasally today. Patient was observed by provider throughout [facility] treatment. The patient experienced the typical dissociation which gradually resolved over the 2-hour period of observation. There were no complications. Specifically, the patient did not have any untoward side effects - feeling disconnected from themself, their thoughts, feelings and things around them, dizziness, nausea, feeling sleepy, decreased feeling of sensitivity (numbness) spinning sensation, feeling anxious, lack of energy, increased blood pressure, feeling happy or very excited, or headache. Blood pressures remained within normal ranges at the 40-minute and 2-hour follow-up intervals. By the time the 2-hour observation period was met the patient was alert and oriented and able to exit without assistance. Patient willing to continue Spravato administration for the treatment of resistant depression. See nursing note for further details.    PHQ2-9    Flowsheet Row Counselor from 12/25/2022 in [facility] Video Visit from 02/11/2022 in [facility] [place] Counselor from 11/28/2021 in [facility] Office Visit from 08/13/2021 in [facility] at [place] Office Visit from 04/04/2021 in [facility] at [facility] Total Score 6 0 2 0 0  PHQ-9 Total Score 14 4 7  0 --      Flowsheet Row ED from 06/04/2021 in [facility] Emergency Department at [facility]  C-SSRS RISK CATEGORY No Risk        Review of Systems:  Review of  Systems  Musculoskeletal:  Negative for gait problem.  Neurological:  Negative for tremors.  Psychiatric/Behavioral:         Please refer to HPI    Medications: I have reviewed the patient's current medications.  Current Outpatient Medications  Medication Sig Dispense Refill   ALPRAZolam (XANAX) 0.25 MG tablet Take 1 tablet (0.25 mg total) by mouth daily as needed for anxiety. 30 tablet 0   atorvastatin (LIPITOR) 40 MG tablet Take 1 tablet (40 mg total) by mouth at bedtime. 30 tablet 0   atorvastatin (LIPITOR) 40 MG tablet TAKE ONE TABLET BY MOUTH AT BEDTIME 30 tablet 0   buPROPion (WELLBUTRIN XL) 150 MG 24 hr tablet Take 2 tablets (300 mg total) by mouth daily. 180 tablet 1   Esketamine HCl, 56 MG Dose, (SPRAVATO, 56 MG DOSE,) 28 MG/DEVICE SOPK Place 56 mg into the nose every 3 (three) days. USE 2 SPRAY IN EACH NOSTRIL EVERY 3 DAYS 2 each 1   FLUoxetine (PROZAC) 20 MG capsule Take 1 capsule (20 mg total) by mouth daily. 90 capsule 1   FLUoxetine (PROZAC) 20 MG capsule Take 1 capsule (20 mg total) by mouth daily. 30 capsule 2   Glycerin-Hypromellose-PEG 400 (VISINE DRY EYE OP) Place 1 drop into both eyes daily as needed (dry eyes).     hydrochlorothiazide (MICROZIDE) 12.5 MG capsule TAKE 1 CAPSULE BY MOUTH ONCE DAILY 90 capsule 0   hydrOXYzine (ATARAX) 25 MG tablet Take 1 tablet (25 mg total) by mouth at bedtime as needed. 90 tablet 1   losartan (COZAAR) 25 MG tablet TAKE ONE TABLET BY MOUTH DAILY 90 tablet 0   pantoprazole (PROTONIX) 40 MG  tablet Take 1 tablet (40 mg total) by mouth daily. 90 tablet 1   Probiotic Product (PROBIOTIC DAILY PO) Take 1 capsule by mouth daily.     QUEtiapine (SEROQUEL) 25 MG tablet Take 1 tablet (25 mg total) by mouth at bedtime. 90 tablet 1   traZODone (DESYREL) 100 MG tablet Take two tablets at bedtime. 180 tablet 1   No current facility-administered medications for this visit.    Medication Side Effects: None  Allergies:  Allergies  Allergen  Reactions   Esomeprazole Diarrhea and Other (See Comments)    Past Medical History:  Diagnosis Date   Depression    GERD (gastroesophageal reflux disease)    Hyperlipidemia    Hypertension     Past Medical History, Surgical history, Social history, and Family history were reviewed and updated as appropriate.   Please see review of systems for further details on the patient's review from today.   Objective:   Physical Exam:  There were no vitals taken for this visit.  Physical Exam Constitutional:      General: She is not in acute distress. Musculoskeletal:        General: No deformity.  Neurological:     Mental Status: She is alert and oriented to person, place, and time.     Coordination: Coordination normal.  Psychiatric:        Attention and Perception: Attention and perception normal. She does not perceive auditory or visual hallucinations.        Mood and Affect: Mood is depressed. Affect is not labile, blunt, angry or inappropriate.        Speech: Speech normal.        Behavior: Behavior normal.        Thought Content: Thought content normal. Thought content is not paranoid or delusional. Thought content does not include homicidal or suicidal ideation. Thought content does not include homicidal or suicidal plan.        Cognition and Memory: Cognition and memory normal.        Judgment: Judgment normal.     Comments: Insight intact     Lab Review:     Component Value Date/Time   NA 140 08/13/2021 0905   K 4.6 08/13/2021 0905   CL 104 08/13/2021 0905   CO2 30 08/13/2021 0905   GLUCOSE 100 (H) 08/13/2021 0905   BUN 22 08/13/2021 0905   BUN 15 07/06/2019 0000   CREATININE 0.85 08/13/2021 0905   CALCIUM 9.3 08/13/2021 0905   PROT 6.8 08/13/2021 0905   ALBUMIN 4.0 08/13/2021 0905   AST 19 08/13/2021 0905   ALT 14 08/13/2021 0905   ALKPHOS 48 08/13/2021 0905   BILITOT 0.4 08/13/2021 0905   GFRNONAA >60 06/04/2021 1154       Component Value Date/Time    WBC 5.8 08/13/2021 0905   RBC 4.22 08/13/2021 0905   HGB 13.7 08/13/2021 0905   HCT 40.7 08/13/2021 0905   PLT 303.0 08/13/2021 0905   MCV 96.5 08/13/2021 0905   MCH 31.9 06/04/2021 1154   MCHC 33.7 08/13/2021 0905   RDW 13.8 08/13/2021 0905   LYMPHSABS 0.7 08/13/2021 0905   MONOABS 0.3 08/13/2021 0905   EOSABS 0.2 08/13/2021 0905   BASOSABS 0.1 08/13/2021 0905    No results found for: "POCLITH", "LITHIUM"   No results found for: "PHENYTOIN", "PHENOBARB", "VALPROATE", "CBMZ"   .res Assessment: Plan:    Recommendations/Plan   Continue Spravato treatment.   Patient advised to contact office with any questions,  adverse effects, or acute worsening in signs and symptoms.  There are no diagnoses linked to this encounter.   Please see After Visit Summary for patient specific instructions.  Future Appointments  Date Time Provider Department Center  05/01/2023 11:00 AM Robley Fries, PhD CP-CP None  06/10/2023 11:00 AM Robley Fries, PhD CP-CP None  07/13/2023 11:00 AM Robley Fries, PhD CP-CP None    No orders of the defined types were placed in this encounter.   -------------------------------

## 2023-04-23 NOTE — Progress Notes (Signed)
NURSES NOTE:         Pt arrived for her 6th Spravato Treatment for treatment resistant depression, the starting dose is 56 mg (2 of the 28 mg nasal sprays) as tolerated. She is a patient of Yvette Rack, DNP so she will follow her throughout her treatments and follow ups. Pt arrived with her friend, [name] and she was very helpful with discussing her treatments. Explained to pt how the treatments would be scheduled and answered any questions she or [name] had today.  It was a bit difficult for her but I believe she is okay to get the medication. Pt's Spravato is ordered through [employer].  Spravato medication is stored at treatment center per REMS/FDA guidelines. The medication is required to be locked behind two doors per REMS/FDA protocol. Medication is also disposed of properly after each use per regulations. All documentation for REMS is completed and submitted per FDA/REMS requirements.          Brought pt back to treatment room. Began taking patient's vital signs at 10:00 AM 136/90, pulse 105. Pt reports taking Xanax 0.25 mg about 9:00 AM to help with her anxiety. Instructed patient to blow her nose if needed then recline back to a 45 degree angle. Pt given trainer again to go over how to administer the nasal spray to make sure she received all the medication. She was given first dose 28 mg nasal spray, pt tried to administer in each nostril as directed and observed by nurse. Pt given a bottle of water but I instructed her it was okay to have a mint or some water between doses. Waited 5 more minutes for the second dose. After both doses given pt did not complain of any nausea/vomiting. Pt immediately asked for potato chips when she was done with both devices.  40 minute vitals, 10:45 AM, 123/79, pulse 77. Pt reports doing fine, no complaints voiced. Pt appeared to be less anxious. Explained she would be monitored for a total time of 120 minutes. Discharge vitals were taken at 12:00 PM 120/84, P 75.  Rene Kocher discussed treatment with her today and also her oral medications.  Recommend she go home and sleep or just relax on the couch. No driving, no intense activities. Verbalized understanding. Pt. will be receiving 2 treatments per week for 4 weeks as recommended. Nurse was with pt a total of 60 minutes for clinical assessment. Pt is scheduled again next week on Tuesday and Thursday. Instructed to call with any issues.     LOT [id number] EXP APR 2027

## 2023-04-24 ENCOUNTER — Other Ambulatory Visit: Payer: Self-pay

## 2023-04-24 MED ORDER — SPRAVATO (56 MG DOSE) 28 MG/DEVICE NA SOPK: 56.0000 mg | PACK | NASAL | 1 refills | Status: DC

## 2023-04-24 NOTE — Telephone Encounter (Signed)
Spravato

## 2023-04-28 ENCOUNTER — Ambulatory Visit: Payer: Medicare PPO | Admitting: Adult Health

## 2023-04-28 ENCOUNTER — Ambulatory Visit: Payer: Medicare PPO

## 2023-04-28 ENCOUNTER — Encounter: Payer: Self-pay | Admitting: Adult Health

## 2023-04-28 VITALS — BP 110/68 | HR 70

## 2023-04-28 DIAGNOSIS — F339 Major depressive disorder, recurrent, unspecified: Secondary | ICD-10-CM

## 2023-04-28 NOTE — Progress Notes (Signed)
Jamie Gamble [id number] [birth year] 78 y.o.  Subjective:   Patient ID:  Jamie Gamble is a 78 y.o. (DOB [birth year]) female.  Chief Complaint: No chief complaint on file.   HPI Jamie Gamble presents to the office today for follow-up of treatment resistant depression (TRD).  Accompanied by a friend.   Patient was administered Spravato 56 mg intranasally today. Patient was observed by provider throughout [facility] treatment. The patient experienced the typical dissociation which gradually resolved over the 2-hour period of observation. There were no complications. Specifically, the patient did not have any untoward side effects - feeling disconnected from themself, their thoughts, feelings and things around them, dizziness, nausea, feeling sleepy, decreased feeling of sensitivity (numbness) spinning sensation, feeling anxious, lack of energy, increased blood pressure, feeling happy or very excited, or headache. Blood pressures remained within normal ranges at the 40-minute and 2-hour follow-up intervals. By the time the 2-hour observation period was met the patient was alert and oriented and able to exit without assistance. Patient willing to continue Spravato administration for the treatment of resistant depression. See nursing note for further details.   PHQ2-9    Flowsheet Row Counselor from 12/25/2022 in [facility] Video Visit from 02/11/2022 in [facility] [place] Counselor from 11/28/2021 in [facility] Office Visit from 08/13/2021 in [facility] [place] Office Visit from 04/04/2021 in [facility] at [facility] Total Score 6 0 2 0 0  PHQ-9 Total Score 14 4 7  0 --      Flowsheet Row ED from 06/04/2021 in [facility] Emergency Department at [facility]  C-SSRS RISK CATEGORY No Risk        Review of Systems:  Review of Systems  Musculoskeletal:   Negative for gait problem.  Neurological:  Negative for tremors.  Psychiatric/Behavioral:         Please refer to HPI    Medications: I have reviewed the patient's current medications.  Current Outpatient Medications  Medication Sig Dispense Refill   ALPRAZolam (XANAX) 0.25 MG tablet Take 1 tablet (0.25 mg total) by mouth daily as needed for anxiety. 30 tablet 0   atorvastatin (LIPITOR) 40 MG tablet Take 1 tablet (40 mg total) by mouth at bedtime. 30 tablet 0   atorvastatin (LIPITOR) 40 MG tablet TAKE ONE TABLET BY MOUTH AT BEDTIME 30 tablet 0   buPROPion (WELLBUTRIN XL) 150 MG 24 hr tablet Take 2 tablets (300 mg total) by mouth daily. 180 tablet 1   Esketamine HCl, 56 MG Dose, (SPRAVATO, 56 MG DOSE,) 28 MG/DEVICE SOPK Place 56 mg into the nose every 3 (three) days. USE 2 SPRAY IN EACH NOSTRIL EVERY 3 DAYS 2 each 1   FLUoxetine (PROZAC) 20 MG capsule Take 1 capsule (20 mg total) by mouth daily. 90 capsule 1   FLUoxetine (PROZAC) 20 MG capsule Take 1 capsule (20 mg total) by mouth daily. 30 capsule 2   Glycerin-Hypromellose-PEG 400 (VISINE DRY EYE OP) Place 1 drop into both eyes daily as needed (dry eyes).     hydrochlorothiazide (MICROZIDE) 12.5 MG capsule TAKE 1 CAPSULE BY MOUTH ONCE DAILY 90 capsule 0   hydrOXYzine (ATARAX) 25 MG tablet Take 1 tablet (25 mg total) by mouth at bedtime as needed. 90 tablet 1   losartan (COZAAR) 25 MG tablet TAKE ONE TABLET BY MOUTH DAILY 90 tablet 0   pantoprazole (PROTONIX) 40 MG tablet Take 1 tablet (40 mg  total) by mouth daily. 90 tablet 1   Probiotic Product (PROBIOTIC DAILY PO) Take 1 capsule by mouth daily.     QUEtiapine (SEROQUEL) 25 MG tablet Take 1 tablet (25 mg total) by mouth at bedtime. 90 tablet 1   traZODone (DESYREL) 100 MG tablet Take two tablets at bedtime. 180 tablet 1   No current facility-administered medications for this visit.    Medication Side Effects: None  Allergies:  Allergies  Allergen Reactions   Esomeprazole Diarrhea  and Other (See Comments)    Past Medical History:  Diagnosis Date   Depression    GERD (gastroesophageal reflux disease)    Hyperlipidemia    Hypertension     Past Medical History, Surgical history, Social history, and Family history were reviewed and updated as appropriate.   Please see review of systems for further details on the patient's review from today.   Objective:   Physical Exam:  There were no vitals taken for this visit.  Physical Exam Constitutional:      General: She is not in acute distress. Musculoskeletal:        General: No deformity.  Neurological:     Mental Status: She is alert and oriented to person, place, and time.     Coordination: Coordination normal.  Psychiatric:        Attention and Perception: Attention and perception normal. She does not perceive auditory or visual hallucinations.        Mood and Affect: Affect is not labile, blunt, angry or inappropriate.        Speech: Speech normal.        Behavior: Behavior normal.        Thought Content: Thought content normal. Thought content is not paranoid or delusional. Thought content does not include homicidal or suicidal ideation. Thought content does not include homicidal or suicidal plan.        Cognition and Memory: Cognition and memory normal.        Judgment: Judgment normal.     Comments: Insight intact     Lab Review:     Component Value Date/Time   NA 140 08/13/2021 0905   K 4.6 08/13/2021 0905   CL 104 08/13/2021 0905   CO2 30 08/13/2021 0905   GLUCOSE 100 (H) 08/13/2021 0905   BUN 22 08/13/2021 0905   BUN 15 07/06/2019 0000   CREATININE 0.85 08/13/2021 0905   CALCIUM 9.3 08/13/2021 0905   PROT 6.8 08/13/2021 0905   ALBUMIN 4.0 08/13/2021 0905   AST 19 08/13/2021 0905   ALT 14 08/13/2021 0905   ALKPHOS 48 08/13/2021 0905   BILITOT 0.4 08/13/2021 0905   GFRNONAA >60 06/04/2021 1154       Component Value Date/Time   WBC 5.8 08/13/2021 0905   RBC 4.22 08/13/2021 0905    HGB 13.7 08/13/2021 0905   HCT 40.7 08/13/2021 0905   PLT 303.0 08/13/2021 0905   MCV 96.5 08/13/2021 0905   MCH 31.9 06/04/2021 1154   MCHC 33.7 08/13/2021 0905   RDW 13.8 08/13/2021 0905   LYMPHSABS 0.7 08/13/2021 0905   MONOABS 0.3 08/13/2021 0905   EOSABS 0.2 08/13/2021 0905   BASOSABS 0.1 08/13/2021 0905    No results found for: "POCLITH", "LITHIUM"   No results found for: "PHENYTOIN", "PHENOBARB", "VALPROATE", "CBMZ"   .res Assessment: Plan:    Recommendations/Plan   Continue Spravato treatment.   Patient advised to contact office with any questions, adverse effects, or acute worsening in signs and symptoms.  There are no diagnoses linked to this encounter.   Please see After Visit Summary for patient specific instructions.  Future Appointments  Date Time Provider Department Center  04/30/2023 10:00 AM CP-NURSE CP-CP None  04/30/2023 10:00 AM Inman Fettig, Thereasa Solo, NP CP-CP None  05/01/2023 11:00 AM Robley Fries, PhD CP-CP None  06/10/2023 11:00 AM Robley Fries, PhD CP-CP None  07/13/2023 11:00 AM Robley Fries, PhD CP-CP None    No orders of the defined types were placed in this encounter.   -------------------------------

## 2023-04-28 NOTE — Progress Notes (Signed)
 NURSES NOTE:         Pt arrived for her 7th Spravato  Treatment for treatment resistant depression, the starting dose is 56 mg (2 of the 28 mg nasal sprays) as tolerated. She is a patient of Angeline Sayers, DNP so she will follow her throughout her treatments and follow ups. Pt arrived with her friend, [name] and she was very helpful with discussing her treatments. Explained to pt how the treatments would be scheduled and answered any questions she or [name] had today.  It was a bit difficult for her but I believe she is okay to get the medication. Pt's Spravato  is ordered through [employer].  Spravato  medication is stored at treatment center per REMS/FDA guidelines. The medication is required to be locked behind two doors per REMS/FDA protocol. Medication is also disposed of properly after each use per regulations. All documentation for REMS is completed and submitted per FDA/REMS requirements.          Brought pt back to treatment room. Began taking patient's vital signs at 10:12 AM 130/79, pulse 84. Pt reports taking Xanax  0.25 mg about 9:00 AM to help with her anxiety. Instructed patient to blow her nose if needed then recline back to a 45 degree angle. Pt declined needing trainer today. She was given first dose 28 mg nasal spray, pt tried to administer in each nostril as directed and observed by nurse. Pt given a bottle of water but I instructed her it was okay to have a mint or some water between doses. Waited 5 more minutes for the second dose. After both doses given pt did not complain of any nausea/vomiting. Pt immediately asked for potato chips when she was done with both devices.  40 minute vitals, 11:08 AM, 110/80, pulse 75. Pt reports doing fine, no complaints voiced. Pt appeared to be less anxious. Explained she would be monitored for a total time of 120 minutes. Discharge vitals were taken at 12:01 PM 110/68, P 70. Angeline discussed treatment with her today and also her oral medications.   Recommend she go home and sleep or just relax on the couch. No driving, no intense activities. Verbalized understanding. Pt. will be receiving 2 treatments per week for 4 weeks as recommended. Nurse was with pt a total of 60 minutes for clinical assessment. Pt is scheduled Thursday. Instructed to call with any issues.     LOT [id number] EXP APR 2027

## 2023-04-29 ENCOUNTER — Other Ambulatory Visit: Payer: Self-pay | Admitting: Adult Health

## 2023-04-30 ENCOUNTER — Encounter: Payer: Self-pay | Admitting: Adult Health

## 2023-04-30 ENCOUNTER — Ambulatory Visit: Payer: Medicare PPO | Admitting: Adult Health

## 2023-04-30 ENCOUNTER — Ambulatory Visit: Payer: Medicare PPO

## 2023-04-30 ENCOUNTER — Other Ambulatory Visit: Payer: Self-pay

## 2023-04-30 VITALS — BP 130/78 | HR 66

## 2023-04-30 DIAGNOSIS — F339 Major depressive disorder, recurrent, unspecified: Secondary | ICD-10-CM

## 2023-04-30 MED ORDER — SPRAVATO (84 MG DOSE) 28 MG/DEVICE NA SOPK: 84.0000 mg | PACK | NASAL | 3 refills | Status: DC

## 2023-04-30 NOTE — Progress Notes (Signed)
 NURSES NOTE:         Pt arrived for her 8th Spravato  Treatment for treatment resistant depression, the starting dose is 56 mg (2 of the 28 mg nasal sprays) as tolerated. She is a patient of Angeline Sayers, DNP so she will follow her throughout her treatments and follow ups. Pt arrived with her friend, [name] and she was very helpful with discussing her treatments. Explained to pt how the treatments would be scheduled and answered any questions she or [name] had today.  It was a bit difficult for her but I believe she is okay to get the medication. Pt's Spravato  is ordered through [employer].  Spravato  medication is stored at treatment center per REMS/FDA guidelines. The medication is required to be locked behind two doors per REMS/FDA protocol. Medication is also disposed of properly after each use per regulations. All documentation for REMS is completed and submitted per FDA/REMS requirements.          Brought pt back to treatment room. Began taking patient's vital signs at 10:30 AM 117/81, pulse 79. Pt reports taking Xanax  0.25 mg about 9:00 AM to help with her anxiety. Instructed patient to blow her nose if needed then recline back to a 45 degree angle. Pt declined needing trainer today. She was given first dose 28 mg nasal spray, pt tried to administer in each nostril as directed and observed by nurse. Pt given a bottle of water but I instructed her it was okay to have a mint or some water between doses. Waited 5 more minutes for the second dose. After both doses given pt did not complain of any nausea/vomiting. Pt immediately asked for potato chips when she was done with both devices.  40 minute vitals, 11:10 AM, 117/76, pulse 67. Pt reports doing fine, no complaints voiced. Pt appeared to be less anxious. Explained she would be monitored for a total time of 120 minutes. Discharge vitals were taken at 12:24 PM 130/78, P 66. Angeline discussed treatment with her today.  Recommend she go home and sleep or  just relax on the couch. No driving, no intense activities. Verbalized understanding. Pt. will be increase dose to 84 mg weekly and she how she does with that dose. Nurse was with pt a total of 60 minutes for clinical assessment. Pt is scheduled next Tuesday. Instructed to call with any issues.     LOT [id number] EXP APR 2027

## 2023-04-30 NOTE — Progress Notes (Signed)
 Jamie Gamble [id number] 10/04/1945 78 y.o.  Subjective:   Patient ID:  Jamie Gamble is a 78 y.o. (DOB 14-Nov-1945) female.  Chief Complaint: No chief complaint on file.   HPI Jamie Gamble presents to the office today for follow-up of treatment resistant depression (TRD).  Accompanied by a friend.   Patient was administered Spravato  56 mg intranasally today. Patient was observed by provider throughout Spravato  treatment. The patient experienced the typical dissociation which gradually resolved over the 2-hour period of observation. There were no complications. Specifically, the patient did not have any untoward side effects - feeling disconnected from themself, their thoughts, feelings and things around them, dizziness, nausea, feeling sleepy, decreased feeling of sensitivity (numbness) spinning sensation, feeling anxious, lack of energy, increased blood pressure, feeling happy or very excited, or headache. Blood pressures remained within normal ranges at the 40-minute and 2-hour follow-up intervals. By the time the 2-hour observation period was met the patient was alert and oriented and able to exit without assistance. Patient willing to continue Spravato  administration for the treatment of resistant depression. See nursing note for further details.    PHQ2-9    Flowsheet Row Counselor from 12/25/2022 in [facility] Video Visit from 02/11/2022 in [facility] [place] Counselor from 11/28/2021 in [facility] Office Visit from 08/13/2021 in [facility] at [place] Office Visit from 04/04/2021 in [facility] [place]  PHQ-2 Total Score 6 0 2 0 0  PHQ-9 Total Score 14 4 7  0 --      Flowsheet Row ED from 06/04/2021 in [facility] Emergency Department at [facility]  C-SSRS RISK CATEGORY No Risk        Review of Systems:  Review of Systems  Musculoskeletal:   Negative for gait problem.  Neurological:  Negative for tremors.  Psychiatric/Behavioral:         Please refer to HPI    Medications: I have reviewed the patient's current medications.  Current Outpatient Medications  Medication Sig Dispense Refill   ALPRAZolam  (XANAX ) 0.25 MG tablet Take 1 tablet (0.25 mg total) by mouth daily as needed for anxiety. 30 tablet 0   atorvastatin  (LIPITOR) 40 MG tablet Take 1 tablet (40 mg total) by mouth at bedtime. 30 tablet 0   atorvastatin  (LIPITOR) 40 MG tablet TAKE ONE TABLET BY MOUTH AT BEDTIME 30 tablet 0   buPROPion  (WELLBUTRIN  XL) 150 MG 24 hr tablet Take 2 tablets (300 mg total) by mouth daily. 180 tablet 1   Esketamine HCl, 56 MG Dose, (SPRAVATO , 56 MG DOSE,) 28 MG/DEVICE SOPK Place 56 mg into the nose every 3 (three) days. USE 2 SPRAY IN EACH NOSTRIL EVERY 3 DAYS 2 each 1   Esketamine HCl, 84 MG Dose, (SPRAVATO , 84 MG DOSE,) 28 MG/DEVICE SOPK Place 84 mg into the nose every 3 (three) days. 3 each 3   FLUoxetine  (PROZAC ) 20 MG capsule Take 1 capsule (20 mg total) by mouth daily. 90 capsule 1   FLUoxetine  (PROZAC ) 20 MG capsule Take 1 capsule (20 mg total) by mouth daily. 30 capsule 2   Glycerin-Hypromellose-PEG 400 (VISINE DRY EYE OP) Place 1 drop into both eyes daily as needed (dry eyes).     hydrochlorothiazide  (MICROZIDE ) 12.5 MG capsule TAKE 1 CAPSULE BY MOUTH ONCE DAILY 90 capsule 0   hydrOXYzine  (ATARAX ) 25 MG tablet Take 1 tablet (25 mg total) by mouth at bedtime as needed. 90 tablet 1  losartan  (COZAAR ) 25 MG tablet TAKE ONE TABLET BY MOUTH DAILY 90 tablet 0   pantoprazole  (PROTONIX ) 40 MG tablet Take 1 tablet (40 mg total) by mouth daily. 90 tablet 1   Probiotic Product (PROBIOTIC DAILY PO) Take 1 capsule by mouth daily.     QUEtiapine  (SEROQUEL ) 25 MG tablet Take 1 tablet (25 mg total) by mouth at bedtime. 90 tablet 1   traZODone  (DESYREL ) 100 MG tablet Take two tablets at bedtime. 180 tablet 1   No current facility-administered  medications for this visit.    Medication Side Effects: None  Allergies:  Allergies  Allergen Reactions   Esomeprazole Diarrhea and Other (See Comments)    Past Medical History:  Diagnosis Date   Depression    GERD (gastroesophageal reflux disease)    Hyperlipidemia    Hypertension     Past Medical History, Surgical history, Social history, and Family history were reviewed and updated as appropriate.   Please see review of systems for further details on the patient's review from today.   Objective:   Physical Exam:  There were no vitals taken for this visit.  Physical Exam Constitutional:      General: She is not in acute distress. Musculoskeletal:        General: No deformity.  Neurological:     Mental Status: She is alert and oriented to person, place, and time.     Coordination: Coordination normal.  Psychiatric:        Attention and Perception: Attention and perception normal. She does not perceive auditory or visual hallucinations.        Mood and Affect: Mood normal. Mood is not anxious or depressed. Affect is not labile, blunt, angry or inappropriate.        Speech: Speech normal.        Behavior: Behavior normal.        Thought Content: Thought content normal. Thought content is not paranoid or delusional. Thought content does not include homicidal or suicidal ideation. Thought content does not include homicidal or suicidal plan.        Cognition and Memory: Cognition and memory normal.        Judgment: Judgment normal.     Comments: Insight intact     Lab Review:     Component Value Date/Time   NA 140 08/13/2021 0905   K 4.6 08/13/2021 0905   CL 104 08/13/2021 0905   CO2 30 08/13/2021 0905   GLUCOSE 100 (H) 08/13/2021 0905   BUN 22 08/13/2021 0905   BUN 15 07/06/2019 0000   CREATININE 0.85 08/13/2021 0905   CALCIUM  9.3 08/13/2021 0905   PROT 6.8 08/13/2021 0905   ALBUMIN 4.0 08/13/2021 0905   AST 19 08/13/2021 0905   ALT 14 08/13/2021 0905    ALKPHOS 48 08/13/2021 0905   BILITOT 0.4 08/13/2021 0905   GFRNONAA >60 06/04/2021 1154       Component Value Date/Time   WBC 5.8 08/13/2021 0905   RBC 4.22 08/13/2021 0905   HGB 13.7 08/13/2021 0905   HCT 40.7 08/13/2021 0905   PLT 303.0 08/13/2021 0905   MCV 96.5 08/13/2021 0905   MCH 31.9 06/04/2021 1154   MCHC 33.7 08/13/2021 0905   RDW 13.8 08/13/2021 0905   LYMPHSABS 0.7 08/13/2021 0905   MONOABS 0.3 08/13/2021 0905   EOSABS 0.2 08/13/2021 0905   BASOSABS 0.1 08/13/2021 0905    No results found for: POCLITH, LITHIUM   No results found for: PHENYTOIN, PHENOBARB, VALPROATE,  CBMZ   .res Assessment: Plan:    Recommendations/Plan   Continue Spravato  treatment.   Patient advised to contact office with any questions, adverse effects, or acute worsening in signs and symptoms.  Diagnoses and all orders for this visit:  Recurrent major depression resistant to treatment [facility])     Please see After Visit Summary for patient specific instructions.  Future Appointments  Date Time Provider Department Center  05/01/2023 11:00 AM Marijean Charleston, PhD CP-CP None  06/10/2023 11:00 AM Marijean Charleston, PhD CP-CP None  07/13/2023 11:00 AM Marijean Charleston, PhD CP-CP None    No orders of the defined types were placed in this encounter.   -------------------------------

## 2023-05-01 ENCOUNTER — Ambulatory Visit: Payer: Medicare PPO | Admitting: Psychiatry

## 2023-05-01 DIAGNOSIS — F411 Generalized anxiety disorder: Secondary | ICD-10-CM

## 2023-05-01 DIAGNOSIS — Z9189 Other specified personal risk factors, not elsewhere classified: Secondary | ICD-10-CM

## 2023-05-01 DIAGNOSIS — F40232 Fear of other medical care: Secondary | ICD-10-CM | POA: Diagnosis not present

## 2023-05-01 DIAGNOSIS — R69 Illness, unspecified: Secondary | ICD-10-CM | POA: Diagnosis not present

## 2023-05-01 DIAGNOSIS — F5104 Psychophysiologic insomnia: Secondary | ICD-10-CM

## 2023-05-01 DIAGNOSIS — F339 Major depressive disorder, recurrent, unspecified: Secondary | ICD-10-CM

## 2023-05-01 NOTE — Progress Notes (Signed)
 Psychotherapy Progress Note [facility] [name], PhD LP  Patient ID: Jamie Gamble [facility])    MRN: [id number] Therapy format: Individual psychotherapy Date: 05/01/2023      Start: 11:18a     Stop: 11:37a      Time Spent: 29 min + peer consultation time  = 45 min Location: Telehealth visit -- I connected with this patient by an approved telecommunication method (video), with her informed consent, and verifying identity and patient privacy.  I was located at my office and patient at her home.  As needed, we discussed the limitations, risks, and security and privacy concerns associated with telehealth service, including the availability and conditions which currently govern in-person appointments and the possibility that 3rd-party payment may not be fully guaranteed and she may be responsible for charges.  After she indicated understanding, we proceeded with the session.  Also discussed treatment planning, as needed, including ongoing verbal agreement with the plan, the opportunity to ask and answer all questions, her demonstrated understanding of instructions, and her readiness to call the office should symptoms worsen or she feels she is in a crisis state and needs more immediate and tangible assistance.   Session narrative (presenting needs, interim history, self-report of stressors and symptoms, applications of prior therapy, status changes, and interventions made in session) 8 treatments into Spravato  now, tentatively says she is getting the hang of it after some nerves and uncertainty if she was doing it right.  Acknowledges some lift in getting out, with Jamie Gamble, and going to lunch on treatment days, as strongly suspected would be some behavioral benefit.  That said, likely to convert to 1/wk treatments, presumably with insurance coverage raising the dosage and reducing the number approved.  Engaged in a thought experiment to recognize the value of getting out, posing the question  which she would rather have if all costs and benefits were equal -- 1/wk or 2/wk.  At first obsessively attached to the idea of it costing more and having to save money, then when clarified still chose 1/wk due to not wanting to challenge her social sensitivity or risk feeling like a burden to [place].  Challenged to consider booking a second outing per week with Jamie Gamble if Spravato  schedule reduces, but flatly declined, dug in on the idea of burdening her.  Supportively confronted maintaining her depression with such choices, and secured a promise she would at least ask Jamie Gamble before jumping to any conclusions about burdening her.  Notes she can worry plenty of Spravato  is going to work.  Acknowledged and supportively confronted how ruminating about that can become a self-fulfilling failure.  Reinforced an approach toward treatment, and any efforts she makes to buck depression, summed up in the phrase Only curious, not obligated to guarantee results.  Volunteers that she is taking a Xanax  before treatment because it makes her nervous, though it may be small.  Endorsed whatever helps her follow through, just make sure she gives in to it and fights the temptation to ruminate if that will be enough.  Noted also that she typically will watch movies on her phone during Spravato .  Briefly discussed whether this might further enact her anxious/depressive habit of inadequately trying to distract herself while obsessing about whether it will either work or make her feel strange, and offered what others will do listening to music, or drawing, or maybe writing.  Against writing, even if it would be purely emotional expression and externalizing thoughts instead of being stuck with  them or stuck trying to avoid them.    Noted losing interest and energy for the session, asked to remain a bit.  Probed nutrition -- Gives platitudes but admits vegetables maybe 1-2/wk, fruit 2-3/wk (mainly bananas).  Does think she gets enough  protein.  Every day a sandwich, sometimes 2, once in a while cereal.  Is gaining weight some, not exactly happy with that.  Does feel she's getting enough water.  Does keep up with MVI and magnesium, calcium  with vitamin D .  No digestive issues.  Sleep cycle Fair.  Not enough sleep, frequent nightmares, often of abandonment somehow, by family and friends alike.  May try to speak in them, but not acknowledged.     With clearer signs tiring of interactions, allowed her to abbreviate session near the 30 min mark, and used the remainder to message psychiatry team in some detail to provide awareness of treatment issues and opportunity to support behavioral goals and reality testing, as well as continued understanding that her resistance and avoidance maintain depression.  Therapeutic modalities: Cognitive Behavioral Therapy, Solution-Oriented/Positive Psychology, Environmental Manager, and Motivational Interviewing  Mental Status/Observations:  Appearance:   Casual     Behavior:  Appropriate and Resistant  Motor:  Normal  Speech/Language:   Clear and Coherent  Affect:  Depressed  Mood:  depressed  Thought process:  normal  Thought content:    Rumination  Sensory/Perceptual disturbances:    WNL  Orientation:  Fully oriented  Attention:  Good    Concentration:  Good  Memory:  WNL  Insight:    Fair  Judgment:   Fair  Impulse Control:  Good   Risk Assessment: Danger to Self: No Self-injurious Behavior: No Danger to Others: No Physical Aggression / Violence: No Duty to Warn: No Access to Firearms a concern: No  Assessment of progress:  stabilized  Diagnosis:   ICD-10-CM   1. Recurrent major depression resistant to treatment (HCC)  F33.9     2. Generalized anxiety disorder  F41.1     3. r/o Avoidant Personality  R69     4. Psychophysiological insomnia  F51.04     5. At increased risk for social isolation  Z91.89     6. Fear of other medical care, particularly ECT  F40.232      Plan:   Safety & welfare plan -- Continue pledge of no harm, see one or the other mental health provider at least 2/month until returning to normal activity outside the home.  Jamie Gamble remains POA and cleared to confer PRN.  Recommend all treatment team members stand willing to contact her if any indications of poor safety, poor comprehension, or poor judgment. Still priority resolve to Continue supplements -- NAC regularly in the morning, Mg L-theonate regularly in the evening Make a daily practice of seeing some sunlight in the morning Try to get out for something, not just have it all delivered Try to allow a neighbor to interact briefly, and say so if she'd like to get back home, trust it's not rude but honest Notice and dispute I have to, but I can't thinking wrestling with tasks -- either determine I don't have to after all, or I actually can do a little something Notice any difference these make, however small Prayer tone -- Asking God to take away depression is honest, but asking help seeing better things and raising stronger responses is more constructive Behavioral/social activation -- advise be in touch with Gamble, try to contact Toastmaster[employer] friends, and entertain  asking someone to come over or get out with them somehow Sleep regulation -- Should continue orange glasses QPM to facilitate circadian signaling, sleep readiness, and sleep hardiness.  Best recommendation dosing sleep-assisting meds up to 1 hr before bed.  Option add melatonin 2-5mg  if needed 1-2 hr ahead of bed.  Practice sleep hygiene, including comfortable arrangements, lights reduced, and all caffeine stopped well ahead of bedtime (at least 6 hrs).  Use soothing imagery PRN for DFA.  Should adhere to CPAP and ensure proper maintenance.  For worries, option write down worry thoughts and consciously decide whether to stay up and think or lay them down and sleep.  Acknowledge and table worry about whether she will sleep -- trust  the natural drive for sleep to come around, and if too wakeful, get out of bed to do something not too stimulating.  Trust also any uptick in dreams as natural progression of catching up sleep deficits and processing emotional life issues. Physiological mood factors -- Start the day with good lighting, movement/stretching, and a few minutes grounding, outdoors if available/desired.  Vitamin levels are normal, but improved B12 and D could still be helpful.  Should get some moderate exercise as able, even just walking and stretching, to combat depressogenic inactivity effects. Self-esteem and intrusive guilt/shame thoughts -- PRN practices of blessing-counting and listing ways people have told/shown her she is lovable/capable.  No requirement to believe or agree, just consider.  Seek to notice and give self credit when she does anything constructive or principled for things that matter to her wellbeing or sense of purpose and for any efforts that are better than full avoidance.  As needed, self-affirm God would not be punishing her, it's a trick of depression to think it. Activities and socialization -- Continue to strongly recommend at least minimal outings to break rumination and stimulate her emotional and behavioral systems.  As able, develop activities of interest.  Self-monitor for broad demotivation and reach out.  Known options in friend Jamie Gamble Jamie Gamble, [employer], and church activities at 3 locations, ranging from chair yoga to entertainment/fellowship group to more theological and philosophical study.  Maintain/restore system of friends beyond [place], which may include anyone available at housing community.  Dispute worry thoughts of being judged, and enter interpersonal space with permission to feel not so up to it, either.  [employer] can be available for supportive calls, assistance services, and possible facility-based recreation.  Home care companions when needed.  Self-monitor for impulse to  more fully isolate, and in all involvements practice permission to be authentic about her tolerance for spending time in view or the presence of others.  OK with employment if regains interest. Financial concerns -- Follow through fact-finding and meeting with advisor, in coordination with friend/POA Jamie Gamble.  Still OK to seek part-time work she can reach from her home.  Maintain cordial working relationship with nephew/trustee Jamie Gamble. Placement concerns -- Independent living with PRN assistance should be fine for the foreseeable future.  Available on request to interpret her condition to family members and work out concerns. Specialized therapies -- ECT on hold, has cognitively recovered for months, and the idea of shock therapy (and anesthesia) is highly anxiety-provoking to her.  Pt understands and accepts the burden to notify treatment team promptly of relapse in dark/dreadful mood, severe demotivation, or intractable insomnia.  Continue Spravato  therapy, encourage change of activities from movie watching to music or writing, and mange thinking about it from anxious watchfulness to open/curious to see what  happens. Medical procedure anxiety -- Ask all necessary questions of health care, make sure to adequately comprehend recommendations rather than catastrophize without knowing.  For any procedure accepted, imagine the competence and caring of health care personnel in action, especially if it involves anesthesia and other vulnerable-making procedures. Weight management -- Concur with modified Weight Watchers approach and improved food choices at her interest.  Make sure to stay mobile, watch portions, limit carbs.  May observe practice of leaving a bite, work up concentration of foods contained in the MIND Diet. Other recommendations/advice -- As may be noted above.  Continue to utilize previously learned skills ad lib. Medication compliance -- Maintain medication as prescribed and work faithfully with  relevant prescriber(s) if any changes are desired or seem indicated. Crisis service -- Aware of call list and work-in appts.  Call the clinic on-call service, 988/hotline, 911, or present to [facility] or ER if any life-threatening psychiatric crisis. Followup -- Return in about 2 weeks (around 05/15/2023).  Next scheduled visit with me 06/10/2023.  Next scheduled in this office 05/05/2023.  Lamar Kendall, PhD [name], PhD LP Clinical Psychologist, [facility] [address] [place], [place] [address] [phone]

## 2023-05-02 DIAGNOSIS — G4733 Obstructive sleep apnea (adult) (pediatric): Secondary | ICD-10-CM | POA: Diagnosis not present

## 2023-05-05 ENCOUNTER — Encounter: Payer: Self-pay | Admitting: Adult Health

## 2023-05-05 ENCOUNTER — Ambulatory Visit: Payer: Medicare PPO

## 2023-05-05 ENCOUNTER — Ambulatory Visit: Payer: Medicare PPO | Admitting: Adult Health

## 2023-05-05 ENCOUNTER — Telehealth: Payer: Self-pay | Admitting: Internal Medicine

## 2023-05-05 ENCOUNTER — Other Ambulatory Visit: Payer: Self-pay | Admitting: Internal Medicine

## 2023-05-05 VITALS — BP 108/69 | HR 74

## 2023-05-05 DIAGNOSIS — F339 Major depressive disorder, recurrent, unspecified: Secondary | ICD-10-CM

## 2023-05-05 NOTE — Telephone Encounter (Signed)
Patient is aware.  Patient states that she is in "survivor treatment for depression, and it makes it hard for her to come to the office."

## 2023-05-05 NOTE — Progress Notes (Unsigned)
NURSES NOTE:         Pt arrived for her 9th Spravato Treatment for treatment resistant depression, the starting dose is 56 mg (2 of the 28 mg nasal sprays) as tolerated. Today is the first day she is increasing to 84 mg. She is a patient of Yvette Rack, DNP so she will follow her throughout her treatments and follow ups. Pt arrived with her friend, [name] and she was very helpful with discussing her treatments. Explained to pt how the treatments would be scheduled and answered any questions she or [name] had today.  It was a bit difficult for her but I believe she is okay to get the medication. Pt's Spravato is ordered through [employer].  Spravato medication is stored at treatment center per REMS/FDA guidelines. The medication is required to be locked behind two doors per REMS/FDA protocol. Medication is also disposed of properly after each use per regulations. All documentation for REMS is completed and submitted per FDA/REMS requirements.          Brought pt back to treatment room. Began taking patient's vital signs at 10:09 AM 110/77, pulse 90. Pt reports taking Xanax 0.25 mg about 9:00 AM to help with her anxiety. Instructed patient to blow her nose if needed then recline back to a 45 degree angle. Pt declined needing trainer today. She was given first dose 28 mg nasal spray, pt tried to administer in each nostril as directed and observed by nurse. Pt given a bottle of water but I instructed her it was okay to have a mint or some water between doses. Waited 5 more minutes for the second and third dose. After all doses given pt did not complain of any nausea/vomiting. Pt was very anxious about the dose increase and just wanted to get it over with. Pt immediately asked for potato chips when she was done with her inhalers. devices. Checked on pt a couple of times prior to her halfway mark and she reports not doing well but couldn't explain why. Tried asking if seeing things, she did comment she was  seeing double then she said she wasn't. Pt doesn't seem to understand the treatment cognitively. Unsure if benefiting, pt reports she  40 minute vitals, 11:10 AM, 117/76, pulse 67. Pt reports doing fine, no complaints voiced. Pt appeared to be less anxious. Explained she would be monitored for a total time of 120 minutes. Discharge vitals were taken at 12:24 PM 130/78, P 66. Rene Kocher discussed treatment with her today.  Recommend she go home and sleep or just relax on the couch. No driving, no intense activities. Verbalized understanding. Pt. will be increase dose to 84 mg weekly and she how she does with that dose. Nurse was with pt a total of 60 minutes for clinical assessment. Pt is scheduled next Tuesday. Instructed to call with any issues.     LOT [id number] EXP MAY 2027

## 2023-05-05 NOTE — Telephone Encounter (Signed)
Copied from CRM [id number]. Topic: Clinical - Prescription Issue >> May 05, 2023  1:56 PM Melissa C wrote: Reason for CRM: patient requested atorvastatin (LIPITOR) 40 MG tablet but pharmacy stated it was denied. Patient is in need of medication and she is wondering why it was denied. She uses [facility], [place] - [address] [place] [employer] [place] [address] Phone: [phone]  Fax: [phone]

## 2023-05-05 NOTE — Progress Notes (Signed)
Jamie Gamble [id number] 1945/06/10 78 y.o.  Subjective:   Patient ID:  Jamie Gamble is a 78 y.o. (DOB [birth year]) female.  Chief Complaint: No chief complaint on file.   HPI Jamie Gamble presents to the office today for follow-up of  treatment resistant depression (TRD).  Accompanied by a friend for part of treament.   Patient was administered Spravato 84 mg intranasally today. Patient was observed by provider throughout [facility] treatment. The patient experienced the typical dissociation which gradually resolved over the 2-hour period of observation. There were no complications. Specifically, the patient did not have any untoward side effects - feeling disconnected from themself, their thoughts, feelings and things around them, dizziness, nausea, feeling sleepy, decreased feeling of sensitivity (numbness) spinning sensation, feeling anxious, lack of energy, increased blood pressure, feeling happy or very excited, or headache. Blood pressures remained within normal ranges at the 40-minute and 2-hour follow-up intervals. By the time the 2-hour observation period was met the patient was alert and oriented and able to exit without assistance. Patient willing to continue Spravato administration for the treatment of resistant depression. See nursing note for further details.   PHQ2-9    Flowsheet Row Counselor from 12/25/2022 in [facility] Video Visit from 02/11/2022 in [facility] [place] Counselor from 11/28/2021 in [facility] Office Visit from 08/13/2021 in [facility] [place] Office Visit from 04/04/2021 in [facility] Total Score 6 0 2 0 0  PHQ-9 Total Score 14 4 7  0 --      Flowsheet Row ED from 06/04/2021 in [facility] Emergency Department at [facility]  C-SSRS RISK CATEGORY No Risk        Review of Systems:  Review of Systems   Musculoskeletal:  Negative for gait problem.  Neurological:  Negative for tremors.  Psychiatric/Behavioral:         Please refer to HPI    Medications: I have reviewed the patient's current medications.  Current Outpatient Medications  Medication Sig Dispense Refill   ALPRAZolam (XANAX) 0.25 MG tablet Take 1 tablet (0.25 mg total) by mouth daily as needed for anxiety. 30 tablet 0   atorvastatin (LIPITOR) 40 MG tablet Take 1 tablet (40 mg total) by mouth at bedtime. 30 tablet 0   atorvastatin (LIPITOR) 40 MG tablet TAKE ONE TABLET BY MOUTH AT BEDTIME 30 tablet 0   buPROPion (WELLBUTRIN XL) 150 MG 24 hr tablet Take 2 tablets (300 mg total) by mouth daily. 180 tablet 1   Esketamine HCl, 56 MG Dose, (SPRAVATO, 56 MG DOSE,) 28 MG/DEVICE SOPK Place 56 mg into the nose every 3 (three) days. USE 2 SPRAY IN EACH NOSTRIL EVERY 3 DAYS 2 each 1   Esketamine HCl, 84 MG Dose, (SPRAVATO, 84 MG DOSE,) 28 MG/DEVICE SOPK Place 84 mg into the nose every 3 (three) days. 3 each 3   FLUoxetine (PROZAC) 20 MG capsule Take 1 capsule (20 mg total) by mouth daily. 90 capsule 1   FLUoxetine (PROZAC) 20 MG capsule Take 1 capsule (20 mg total) by mouth daily. 30 capsule 2   Glycerin-Hypromellose-PEG 400 (VISINE DRY EYE OP) Place 1 drop into both eyes daily as needed (dry eyes).     hydrochlorothiazide (MICROZIDE) 12.5 MG capsule TAKE 1 CAPSULE BY MOUTH ONCE DAILY 90 capsule 0   hydrOXYzine (ATARAX) 25 MG tablet Take 1 tablet (25 mg total) by mouth at bedtime  as needed. 90 tablet 1   losartan (COZAAR) 25 MG tablet TAKE ONE TABLET BY MOUTH DAILY 90 tablet 0   pantoprazole (PROTONIX) 40 MG tablet Take 1 tablet (40 mg total) by mouth daily. 90 tablet 1   Probiotic Product (PROBIOTIC DAILY PO) Take 1 capsule by mouth daily.     QUEtiapine (SEROQUEL) 25 MG tablet Take 1 tablet (25 mg total) by mouth at bedtime. 90 tablet 1   traZODone (DESYREL) 100 MG tablet Take two tablets at bedtime. 180 tablet 1   No current  facility-administered medications for this visit.    Medication Side Effects: None  Allergies:  Allergies  Allergen Reactions   Esomeprazole Diarrhea and Other (See Comments)    Past Medical History:  Diagnosis Date   Depression    GERD (gastroesophageal reflux disease)    Hyperlipidemia    Hypertension     Past Medical History, Surgical history, Social history, and Family history were reviewed and updated as appropriate.   Please see review of systems for further details on the patient's review from today.   Objective:   Physical Exam:  There were no vitals taken for this visit.  Physical Exam Constitutional:      General: She is not in acute distress. Musculoskeletal:        General: No deformity.  Neurological:     Mental Status: She is alert and oriented to person, place, and time.     Coordination: Coordination normal.  Psychiatric:        Attention and Perception: Attention and perception normal. She does not perceive auditory or visual hallucinations.        Mood and Affect: Affect is not labile, blunt, angry or inappropriate.        Speech: Speech normal.        Behavior: Behavior normal.        Thought Content: Thought content normal. Thought content is not paranoid or delusional. Thought content does not include homicidal or suicidal ideation. Thought content does not include homicidal or suicidal plan.        Cognition and Memory: Cognition and memory normal.        Judgment: Judgment normal.     Comments: Insight intact     Lab Review:     Component Value Date/Time   NA 140 08/13/2021 0905   K 4.6 08/13/2021 0905   CL 104 08/13/2021 0905   CO2 30 08/13/2021 0905   GLUCOSE 100 (H) 08/13/2021 0905   BUN 22 08/13/2021 0905   BUN 15 07/06/2019 0000   CREATININE 0.85 08/13/2021 0905   CALCIUM 9.3 08/13/2021 0905   PROT 6.8 08/13/2021 0905   ALBUMIN 4.0 08/13/2021 0905   AST 19 08/13/2021 0905   ALT 14 08/13/2021 0905   ALKPHOS 48 08/13/2021 0905    BILITOT 0.4 08/13/2021 0905   GFRNONAA >60 06/04/2021 1154       Component Value Date/Time   WBC 5.8 08/13/2021 0905   RBC 4.22 08/13/2021 0905   HGB 13.7 08/13/2021 0905   HCT 40.7 08/13/2021 0905   PLT 303.0 08/13/2021 0905   MCV 96.5 08/13/2021 0905   MCH 31.9 06/04/2021 1154   MCHC 33.7 08/13/2021 0905   RDW 13.8 08/13/2021 0905   LYMPHSABS 0.7 08/13/2021 0905   MONOABS 0.3 08/13/2021 0905   EOSABS 0.2 08/13/2021 0905   BASOSABS 0.1 08/13/2021 0905    No results found for: "POCLITH", "LITHIUM"   No results found for: "PHENYTOIN", "PHENOBARB", "VALPROATE", "CBMZ"   .  res Assessment: Plan:    Recommendations/Plan   Continue Spravato treatment.   Patient advised to contact office with any questions, adverse effects, or acute worsening in signs and symptoms.  There are no diagnoses linked to this encounter.   Please see After Visit Summary for patient specific instructions.  Future Appointments  Date Time Provider Department Center  06/10/2023 11:00 AM Robley Fries, PhD [facility] None  07/13/2023 11:00 AM Robley Fries, PhD [facility] None    No orders of the defined types were placed in this encounter.   -------------------------------

## 2023-05-06 ENCOUNTER — Other Ambulatory Visit: Payer: Self-pay | Admitting: Internal Medicine

## 2023-05-06 ENCOUNTER — Other Ambulatory Visit: Payer: Self-pay | Admitting: Adult Health

## 2023-05-06 ENCOUNTER — Telehealth: Payer: Self-pay

## 2023-05-06 MED ORDER — LOSARTAN POTASSIUM 25 MG PO TABS: 25.0000 mg | ORAL_TABLET | Freq: Every day | ORAL | 0 refills | Status: DC

## 2023-05-06 MED ORDER — HYDROCHLOROTHIAZIDE 12.5 MG PO CAPS: 12.5000 mg | ORAL_CAPSULE | Freq: Every day | ORAL | 0 refills | Status: DC

## 2023-05-06 NOTE — Telephone Encounter (Signed)
Copied from CRM [id number]. Topic: Clinical - Medication Question >> May 06, 2023  1:59 PM Sonny Dandy B wrote: Reason for CRM: Pt called to ask her provider if she would refill her blood pressure medication until her visit on 05/12/23 pt states she is out of medication, provider refused to refill it until she comes in for an appointment.  Please call pt back at [phone]

## 2023-05-06 NOTE — Addendum Note (Signed)
Addended by: Kern Reap B on: 05/06/2023 02:09 PM   Modules accepted: Orders

## 2023-05-06 NOTE — Telephone Encounter (Signed)
Patient declined an office visit at this time

## 2023-05-06 NOTE — Telephone Encounter (Signed)
Refills sent.  Patient must keep her appointment 05/12/23 for further refills.

## 2023-05-07 ENCOUNTER — Ambulatory Visit: Payer: Medicare PPO

## 2023-05-07 ENCOUNTER — Ambulatory Visit: Payer: Medicare PPO | Admitting: Adult Health

## 2023-05-07 ENCOUNTER — Encounter: Payer: Self-pay | Admitting: Adult Health

## 2023-05-07 VITALS — BP 127/80 | HR 69

## 2023-05-07 DIAGNOSIS — F339 Major depressive disorder, recurrent, unspecified: Secondary | ICD-10-CM | POA: Diagnosis not present

## 2023-05-07 NOTE — Progress Notes (Signed)
Jamie Gamble [id number] [birth year] 78 y.o.  Subjective:   Patient ID:  Jamie Gamble is a 78 y.o. (DOB 09-28-1945) female.  Chief Complaint: No chief complaint on file.   HPI Jamie Gamble presents to the office today for follow-up of treatment resistant depression (TRD).  Patient was administered Spravato 84 mg intranasally today. Patient was observed by provider throughout [facility] treatment. The patient experienced the typical dissociation which gradually resolved over the 2-hour period of observation. There were no complications. Specifically, the patient did not have any untoward side effects - feeling disconnected from themself, their thoughts, feelings and things around them, dizziness, nausea, feeling sleepy, decreased feeling of sensitivity (numbness) spinning sensation, feeling anxious, lack of energy, increased blood pressure, feeling happy or very excited, or headache. Blood pressures remained within normal ranges at the 40-minute and 2-hour follow-up intervals. By the time the 2-hour observation period was met the patient was alert and oriented and able to exit without assistance. Patient willing to continue Spravato administration for the treatment of resistant depression. See nursing note for further details.   PHQ2-9    Flowsheet Row Counselor from 12/25/2022 in [facility] Video Visit from 02/11/2022 in [facility] [place] Counselor from 11/28/2021 in [facility] Office Visit from 08/13/2021 in [facility] [place] Office Visit from 04/04/2021 in [facility] Total Score 6 0 2 0 0  PHQ-9 Total Score 14 4 7  0 --      Flowsheet Row ED from 06/04/2021 in [facility] Department at [facility]  C-SSRS RISK CATEGORY No Risk        Review of Systems:  Review of Systems  Musculoskeletal:  Negative for gait problem.   Neurological:  Negative for tremors.  Psychiatric/Behavioral:         Please refer to HPI    Medications: I have reviewed the patient's current medications.  Current Outpatient Medications  Medication Sig Dispense Refill   ALPRAZolam (XANAX) 0.25 MG tablet Take 1 tablet (0.25 mg total) by mouth daily as needed for anxiety. 30 tablet 0   atorvastatin (LIPITOR) 40 MG tablet Take 1 tablet (40 mg total) by mouth at bedtime. 30 tablet 0   atorvastatin (LIPITOR) 40 MG tablet TAKE ONE TABLET BY MOUTH AT BEDTIME 30 tablet 0   buPROPion (WELLBUTRIN XL) 150 MG 24 hr tablet Take 2 tablets (300 mg total) by mouth daily. 180 tablet 1   Esketamine HCl, 56 MG Dose, (SPRAVATO, 56 MG DOSE,) 28 MG/DEVICE SOPK Place 56 mg into the nose every 3 (three) days. USE 2 SPRAY IN EACH NOSTRIL EVERY 3 DAYS 2 each 1   Esketamine HCl, 84 MG Dose, (SPRAVATO, 84 MG DOSE,) 28 MG/DEVICE SOPK Place 84 mg into the nose every 3 (three) days. 3 each 3   FLUoxetine (PROZAC) 20 MG capsule Take 1 capsule (20 mg total) by mouth daily. 90 capsule 1   FLUoxetine (PROZAC) 20 MG capsule Take 1 capsule (20 mg total) by mouth daily. 30 capsule 2   Glycerin-Hypromellose-PEG 400 (VISINE DRY EYE OP) Place 1 drop into both eyes daily as needed (dry eyes).     hydrochlorothiazide (MICROZIDE) 12.5 MG capsule Take 1 capsule (12.5 mg total) by mouth daily. 30 capsule 0   hydrOXYzine (ATARAX) 25 MG tablet Take 1 tablet (25 mg total) by mouth at bedtime as needed. 90 tablet 1   losartan (COZAAR) 25  MG tablet Take 1 tablet (25 mg total) by mouth daily. 30 tablet 0   pantoprazole (PROTONIX) 40 MG tablet Take 1 tablet (40 mg total) by mouth daily. 90 tablet 1   Probiotic Product (PROBIOTIC DAILY PO) Take 1 capsule by mouth daily.     QUEtiapine (SEROQUEL) 25 MG tablet Take 1 tablet (25 mg total) by mouth at bedtime. 90 tablet 1   traZODone (DESYREL) 100 MG tablet Take two tablets at bedtime. 180 tablet 1   No current facility-administered  medications for this visit.    Medication Side Effects: None  Allergies:  Allergies  Allergen Reactions   Esomeprazole Diarrhea and Other (See Comments)    Past Medical History:  Diagnosis Date   Depression    GERD (gastroesophageal reflux disease)    Hyperlipidemia    Hypertension     Past Medical History, Surgical history, Social history, and Family history were reviewed and updated as appropriate.   Please see review of systems for further details on the patient's review from today.   Objective:   Physical Exam:  There were no vitals taken for this visit.  Physical Exam Constitutional:      General: She is not in acute distress. Musculoskeletal:        General: No deformity.  Neurological:     Mental Status: She is alert and oriented to person, place, and time.     Coordination: Coordination normal.  Psychiatric:        Attention and Perception: Attention and perception normal. She does not perceive auditory or visual hallucinations.        Mood and Affect: Affect is not labile, blunt, angry or inappropriate.        Speech: Speech normal.        Behavior: Behavior normal.        Thought Content: Thought content normal. Thought content is not paranoid or delusional. Thought content does not include homicidal or suicidal ideation. Thought content does not include homicidal or suicidal plan.        Cognition and Memory: Cognition and memory normal.        Judgment: Judgment normal.     Comments: Insight intact     Lab Review:     Component Value Date/Time   NA 140 08/13/2021 0905   K 4.6 08/13/2021 0905   CL 104 08/13/2021 0905   CO2 30 08/13/2021 0905   GLUCOSE 100 (H) 08/13/2021 0905   BUN 22 08/13/2021 0905   BUN 15 07/06/2019 0000   CREATININE 0.85 08/13/2021 0905   CALCIUM 9.3 08/13/2021 0905   PROT 6.8 08/13/2021 0905   ALBUMIN 4.0 08/13/2021 0905   AST 19 08/13/2021 0905   ALT 14 08/13/2021 0905   ALKPHOS 48 08/13/2021 0905   BILITOT 0.4  08/13/2021 0905   GFRNONAA >60 06/04/2021 1154       Component Value Date/Time   WBC 5.8 08/13/2021 0905   RBC 4.22 08/13/2021 0905   HGB 13.7 08/13/2021 0905   HCT 40.7 08/13/2021 0905   PLT 303.0 08/13/2021 0905   MCV 96.5 08/13/2021 0905   MCH 31.9 06/04/2021 1154   MCHC 33.7 08/13/2021 0905   RDW 13.8 08/13/2021 0905   LYMPHSABS 0.7 08/13/2021 0905   MONOABS 0.3 08/13/2021 0905   EOSABS 0.2 08/13/2021 0905   BASOSABS 0.1 08/13/2021 0905    No results found for: "POCLITH", "LITHIUM"   No results found for: "PHENYTOIN", "PHENOBARB", "VALPROATE", "CBMZ"   .res Assessment: Plan:  Recommendations/Plan   Continue Spravato treatment.   Patient advised to contact office with any questions, adverse effects, or acute worsening in signs and symptoms.  Diagnoses and all orders for this visit:  Recurrent major depression resistant to treatment [facility])     Please see After Visit Summary for patient specific instructions.  Future Appointments  Date Time Provider Department Center  05/12/2023  3:30 PM Philip Aspen, Limmie Patricia, MD [facility]  06/10/2023 11:00 AM Robley Fries, PhD [facility] None  07/13/2023 11:00 AM Robley Fries, PhD [facility] None    No orders of the defined types were placed in this encounter.   -------------------------------

## 2023-05-07 NOTE — Progress Notes (Signed)
NURSES NOTE:         Pt arrived for her 10th Spravato Treatment for treatment resistant depression, the starting dose is 56 mg (2 of the 28 mg nasal sprays) as tolerated. She is receiving her 2nd dose of 84 mg, on Tuesday she felt high perhaps seeing double she said. She usually has few words so hard to get a description of how she feels during treatment.  She is a patient of Yvette Rack, DNP so she will follow her throughout her treatments and follow ups. Pt arrived with her friend, [name] and she was very helpful with discussing her treatments. Explained to pt how the treatments would be scheduled and answered any questions she or [name] had today.  She has gotten better with using the inhalers. Pt's Spravato is ordered through [employer].  Spravato medication is stored at treatment center per REMS/FDA guidelines. The medication is required to be locked behind two doors per REMS/FDA protocol. Medication is also disposed of properly after each use per regulations. All documentation for REMS is completed and submitted per FDA/REMS requirements.          Brought pt back to treatment room. Began taking patient's vital signs at 9:15 AM 127/80, pulse 88. Pt reports taking Xanax 0.25 mg about 9:00 AM to help with her anxiety. Instructed patient to blow her nose if needed then recline back to a 45 degree angle. Pt declined needing trainer today. She was given first dose 28 mg nasal spray, pt tried to administer in each nostril as directed and observed by nurse. Pt given a bottle of water but I instructed her it was okay to have a mint or some water between doses. Waited 5 more minutes for the second and third dose. After all doses given pt did not complain of any nausea/vomiting. Pt was anxious about the higher dose again but not as bad, reassured her it should be close to the same feeling she felt on Tuesday but it varies. Pt given bag to get chips, usually 2 small bags. Checked on pt a couple of times prior  to her halfway mark seemed more relaxed today with treatment. Her  40 minute vitals at 10:02 AM, 127/91, pulse 75. Pt reports doing fine, no complaints voiced. Pt appeared to be less anxious. Explained she would be monitored for a total time of 120 minutes. Discharge vitals were taken at 11:15 AM 115/70, P 69. Rene Kocher discussed treatment with her today.  Recommend she go home and sleep or just relax on the couch. No driving, no intense activities. Verbalized understanding. Nurse was with pt a total of 60 minutes for clinical assessment. Pt is scheduled next Wednesday to try a treatment weekly instead of twice a week. Instructed to call with any issues.     LOT [id number] EXP MAY 2027

## 2023-05-08 ENCOUNTER — Other Ambulatory Visit: Payer: Self-pay | Admitting: Adult Health

## 2023-05-12 ENCOUNTER — Encounter: Payer: Self-pay | Admitting: Adult Health

## 2023-05-12 ENCOUNTER — Ambulatory Visit: Payer: Medicare PPO | Admitting: Adult Health

## 2023-05-12 ENCOUNTER — Ambulatory Visit: Payer: Medicare PPO

## 2023-05-12 ENCOUNTER — Ambulatory Visit: Payer: Medicare PPO | Admitting: Internal Medicine

## 2023-05-12 ENCOUNTER — Encounter: Payer: Self-pay | Admitting: Internal Medicine

## 2023-05-12 VITALS — BP 101/64 | HR 71

## 2023-05-12 VITALS — BP 110/78 | HR 93 | Temp 98.2°F | Wt 174.2 lb

## 2023-05-12 DIAGNOSIS — Z1159 Encounter for screening for other viral diseases: Secondary | ICD-10-CM | POA: Diagnosis not present

## 2023-05-12 DIAGNOSIS — F339 Major depressive disorder, recurrent, unspecified: Secondary | ICD-10-CM

## 2023-05-12 DIAGNOSIS — E782 Mixed hyperlipidemia: Secondary | ICD-10-CM

## 2023-05-12 DIAGNOSIS — K219 Gastro-esophageal reflux disease without esophagitis: Secondary | ICD-10-CM | POA: Diagnosis not present

## 2023-05-12 DIAGNOSIS — F332 Major depressive disorder, recurrent severe without psychotic features: Secondary | ICD-10-CM | POA: Diagnosis not present

## 2023-05-12 DIAGNOSIS — I10 Essential (primary) hypertension: Secondary | ICD-10-CM

## 2023-05-12 NOTE — Assessment & Plan Note (Signed)
 Check lipids today, continue atorvastatin 40 mg daily.

## 2023-05-12 NOTE — Progress Notes (Signed)
 Jamie Gamble [id number] 02-Jun-1945 78 y.o.  Subjective:   Patient ID:  Jamie Gamble is a 78 y.o. (DOB 02/17/1946) female.  Chief Complaint: No chief complaint on file.   HPI Jamie Gamble presents to the office today for follow-up of treatment resistant depression (TRD).  Patient was administered Spravato 84 mg intranasally today. Patient was observed by provider throughout [facility] treatment. The patient experienced the typical dissociation which gradually resolved over the 2-hour period of observation. There were no complications. Specifically, the patient did not have any untoward side effects - feeling disconnected from themself, their thoughts, feelings and things around them, dizziness, nausea, feeling sleepy, decreased feeling of sensitivity (numbness) spinning sensation, feeling anxious, lack of energy, increased blood pressure, feeling happy or very excited, or headache. Blood pressures remained within normal ranges at the 40-minute and 2-hour follow-up intervals. By the time the 2-hour observation period was met the patient was alert and oriented and able to exit without assistance. Patient willing to continue Spravato administration for the treatment of resistant depression. See nursing note for further details.   PHQ2-9    Flowsheet Row Counselor from 12/25/2022 in [facility] Video Visit from 02/11/2022 in [facility] [place] Counselor from 11/28/2021 in [facility] Office Visit from 08/13/2021 in [facility] [place] Office Visit from 04/04/2021 in [facility] Total Score 6 0 2 0 0  PHQ-9 Total Score 14 4 7  0 --      Flowsheet Row ED from 06/04/2021 in [facility] Department at [facility]  C-SSRS RISK CATEGORY No Risk        Review of Systems:  Review of Systems  Musculoskeletal:  Negative for gait problem.   Neurological:  Negative for tremors.  Psychiatric/Behavioral:         Please refer to HPI    Medications: I have reviewed the patient's current medications.  Current Outpatient Medications  Medication Sig Dispense Refill   ALPRAZolam (XANAX) 0.25 MG tablet Take 1 tablet (0.25 mg total) by mouth daily as needed for anxiety. 30 tablet 0   atorvastatin (LIPITOR) 40 MG tablet Take 1 tablet (40 mg total) by mouth at bedtime. 30 tablet 0   atorvastatin (LIPITOR) 40 MG tablet TAKE ONE TABLET BY MOUTH AT BEDTIME 30 tablet 0   buPROPion (WELLBUTRIN XL) 150 MG 24 hr tablet Take 2 tablets (300 mg total) by mouth daily. 180 tablet 1   Esketamine HCl, 56 MG Dose, (SPRAVATO, 56 MG DOSE,) 28 MG/DEVICE SOPK Place 56 mg into the nose every 3 (three) days. USE 2 SPRAY IN EACH NOSTRIL EVERY 3 DAYS 2 each 1   Esketamine HCl, 84 MG Dose, (SPRAVATO, 84 MG DOSE,) 28 MG/DEVICE SOPK Place 84 mg into the nose every 3 (three) days. 3 each 3   FLUoxetine (PROZAC) 20 MG capsule Take 1 capsule (20 mg total) by mouth daily. 90 capsule 1   FLUoxetine (PROZAC) 20 MG capsule Take 1 capsule (20 mg total) by mouth daily. 30 capsule 2   Glycerin-Hypromellose-PEG 400 (VISINE DRY EYE OP) Place 1 drop into both eyes daily as needed (dry eyes).     hydrochlorothiazide (MICROZIDE) 12.5 MG capsule Take 1 capsule (12.5 mg total) by mouth daily. 30 capsule 0   hydrOXYzine (ATARAX) 25 MG tablet Take 1 tablet (25 mg total) by mouth at bedtime as needed. 90 tablet 1   losartan (COZAAR) 25  MG tablet Take 1 tablet (25 mg total) by mouth daily. 30 tablet 0   pantoprazole (PROTONIX) 40 MG tablet Take 1 tablet (40 mg total) by mouth daily. 90 tablet 1   Probiotic Product (PROBIOTIC DAILY PO) Take 1 capsule by mouth daily.     QUEtiapine (SEROQUEL) 25 MG tablet Take 1 tablet (25 mg total) by mouth at bedtime. 90 tablet 1   traZODone (DESYREL) 100 MG tablet Take two tablets at bedtime. 180 tablet 1   No current facility-administered  medications for this visit.    Medication Side Effects: None  Allergies:  Allergies  Allergen Reactions   Esomeprazole Diarrhea and Other (See Comments)    Past Medical History:  Diagnosis Date   Depression    GERD (gastroesophageal reflux disease)    Hyperlipidemia    Hypertension     Past Medical History, Surgical history, Social history, and Family history were reviewed and updated as appropriate.   Please see review of systems for further details on the patient's review from today.   Objective:   Physical Exam:  There were no vitals taken for this visit.  Physical Exam Constitutional:      General: She is not in acute distress. Musculoskeletal:        General: No deformity.  Neurological:     Mental Status: She is alert and oriented to person, place, and time.     Coordination: Coordination normal.  Psychiatric:        Attention and Perception: Attention and perception normal. She does not perceive auditory or visual hallucinations.        Mood and Affect: Mood is depressed. Affect is not labile, blunt, angry or inappropriate.        Speech: Speech normal.        Behavior: Behavior normal.        Thought Content: Thought content normal. Thought content is not paranoid or delusional. Thought content does not include homicidal or suicidal ideation. Thought content does not include homicidal or suicidal plan.        Cognition and Memory: Cognition and memory normal.        Judgment: Judgment normal.     Comments: Insight intact     Lab Review:     Component Value Date/Time   NA 140 08/13/2021 0905   K 4.6 08/13/2021 0905   CL 104 08/13/2021 0905   CO2 30 08/13/2021 0905   GLUCOSE 100 (H) 08/13/2021 0905   BUN 22 08/13/2021 0905   BUN 15 07/06/2019 0000   CREATININE 0.85 08/13/2021 0905   CALCIUM 9.3 08/13/2021 0905   PROT 6.8 08/13/2021 0905   ALBUMIN 4.0 08/13/2021 0905   AST 19 08/13/2021 0905   ALT 14 08/13/2021 0905   ALKPHOS 48 08/13/2021 0905    BILITOT 0.4 08/13/2021 0905   GFRNONAA >60 06/04/2021 1154       Component Value Date/Time   WBC 5.8 08/13/2021 0905   RBC 4.22 08/13/2021 0905   HGB 13.7 08/13/2021 0905   HCT 40.7 08/13/2021 0905   PLT 303.0 08/13/2021 0905   MCV 96.5 08/13/2021 0905   MCH 31.9 06/04/2021 1154   MCHC 33.7 08/13/2021 0905   RDW 13.8 08/13/2021 0905   LYMPHSABS 0.7 08/13/2021 0905   MONOABS 0.3 08/13/2021 0905   EOSABS 0.2 08/13/2021 0905   BASOSABS 0.1 08/13/2021 0905    No results found for: "POCLITH", "LITHIUM"   No results found for: "PHENYTOIN", "PHENOBARB", "VALPROATE", "CBMZ"   .res Assessment:  Plan:    Recommendations/Plan   Continue Spravato treatment.   Patient advised to contact office with any questions, adverse effects, or acute worsening in signs and symptoms.  Diagnoses and all orders for this visit:  Recurrent major depression resistant to treatment [facility])     Please see After Visit Summary for patient specific instructions.  Future Appointments  Date Time Provider Department Center  05/12/2023  3:30 PM Philip Aspen, Limmie Patricia, MD [facility]  06/10/2023 11:00 AM Robley Fries, PhD [facility] None  07/13/2023 11:00 AM Robley Fries, PhD CP[facility]CP None    No orders of the defined types were placed in this encounter.   -------------------------------

## 2023-05-12 NOTE — Assessment & Plan Note (Signed)
 Treatment resistant depression, being followed by psychiatry.

## 2023-05-12 NOTE — Progress Notes (Signed)
 NURSES NOTE:         Pt arrived for her 11th Spravato Treatment for treatment resistant depression, the starting dose was 56 mg (2 of the 28 mg nasal sprays) as tolerated. She is receiving  84 mg weekly currently. She usually has few words so hard to get a description of how she feels during treatment.  She is a patient of Yvette Rack, DNP so she will follow her throughout her treatments and follow ups. Pt arrived with her friend, [name] and she was very helpful with discussing her treatments. Explained to pt how the treatments would be scheduled and answered any questions she or [name] had today.  She has gotten better with using the inhalers. Pt's Spravato is ordered through [employer].  Spravato medication is stored at treatment center per REMS/FDA guidelines. The medication is required to be locked behind two doors per REMS/FDA protocol. Medication is also disposed of properly after each use per regulations. All documentation for REMS is completed and submitted per FDA/REMS requirements.          Brought pt back to treatment room. Began taking patient's vital signs at 10:08 AM 134/87, pulse 100. Pt reports taking Xanax 0.25 mg about 9:00 AM to help with her anxiety. Instructed patient to blow her nose if needed then recline back to a 45 degree angle. She was given first dose 28 mg nasal spray, pt administered in each nostril as directed and observed by nurse. Pt given a bottle of water, I advised it  was okay to have a mint or some water between doses. Waited 5 more minutes for the second and third dose. After all doses given pt did not complain of any nausea/vomiting. Pt was anxious about the higher dose again but not as bad, reassured her it should be close to the same feeling she felt on her previous treatments. Pt given bag to get chips, usually 2 small bags. Checked on pt a couple of times prior to her halfway mark seemed more relaxed today with treatment. Her  40 minute vitals at 10:49 AM,  117/84, pulse 92. Pt reports doing fine, no complaints voiced. Pt appeared to be less anxious. Explained she would be monitored for a total time of 120 minutes. Discharge vitals were taken at 11:58 AM 101/64, P 71. Rene Kocher discussed treatment with her today.  Recommend she go home and sleep or just relax on the couch. No driving, no intense activities. Verbalized understanding. Nurse was with pt a total of 60 minutes for clinical assessment. Pt is scheduled next Wednesday to continue her weekly treatments. Instructed to call with any issues.     LOT [id number] EXP MAY 2027

## 2023-05-12 NOTE — Progress Notes (Signed)
 Established Patient Office Visit     CC/Reason for Visit: Follow-up chronic conditions  HPI: Jamie Gamble is a 78 y.o. female who is coming in today for the above mentioned reasons. Past Medical History is significant for: Hypertension, hyperlipidemia, GERD and treatment resistant depression.  She has been followed closely by psychology and psychotherapy.  I have not seen her in over 18 months.  She is due for labs.   Past Medical/Surgical History: Past Medical History:  Diagnosis Date   Depression    GERD (gastroesophageal reflux disease)    Hyperlipidemia    Hypertension     Past Surgical History:  Procedure Laterality Date   TONSILLECTOMY      Social History:  reports that she has quit smoking. She has never used smokeless tobacco. She reports that she does not currently use alcohol. She reports that she does not use drugs.  Allergies: Allergies  Allergen Reactions   Esomeprazole Diarrhea and Other (See Comments)    Family History:  Family History  Problem Relation Age of Onset   Depression Mother    Cancer Father    Depression Brother      Current Outpatient Medications:    ALPRAZolam (XANAX) 0.25 MG tablet, Take 1 tablet (0.25 mg total) by mouth daily as needed for anxiety., Disp: 30 tablet, Rfl: 0   atorvastatin (LIPITOR) 40 MG tablet, Take 1 tablet (40 mg total) by mouth at bedtime., Disp: 30 tablet, Rfl: 0   Esketamine HCl, 84 MG Dose, (SPRAVATO, 84 MG DOSE,) 28 MG/DEVICE SOPK, Place 84 mg into the nose every 3 (three) days., Disp: 3 each, Rfl: 3   FLUoxetine (PROZAC) 20 MG capsule, Take 1 capsule (20 mg total) by mouth daily., Disp: 30 capsule, Rfl: 2   Glycerin-Hypromellose-PEG 400 (VISINE DRY EYE OP), Place 1 drop into both eyes daily as needed (dry eyes)., Disp: , Rfl:    hydrochlorothiazide (MICROZIDE) 12.5 MG capsule, Take 1 capsule (12.5 mg total) by mouth daily., Disp: 30 capsule, Rfl: 0   hydrOXYzine (ATARAX) 25 MG tablet, Take 1 tablet (25 mg  total) by mouth at bedtime as needed., Disp: 90 tablet, Rfl: 1   losartan (COZAAR) 25 MG tablet, Take 1 tablet (25 mg total) by mouth daily., Disp: 30 tablet, Rfl: 0   pantoprazole (PROTONIX) 40 MG tablet, Take 1 tablet (40 mg total) by mouth daily., Disp: 90 tablet, Rfl: 1   Probiotic Product (PROBIOTIC DAILY PO), Take 1 capsule by mouth daily., Disp: , Rfl:    QUEtiapine (SEROQUEL) 25 MG tablet, Take 1 tablet (25 mg total) by mouth at bedtime., Disp: 90 tablet, Rfl: 1   traZODone (DESYREL) 100 MG tablet, Take two tablets at bedtime., Disp: 180 tablet, Rfl: 1  Review of Systems:  Negative unless indicated in HPI.   Physical Exam: Vitals:   05/12/23 1517  BP: 110/78  Pulse: 93  Temp: 98.2 F (36.8 C)  TempSrc: Oral  SpO2: 98%  Weight: 174 lb 3.2 oz (79 kg)    Body mass index is 31.35 kg/m.   Physical Exam Vitals reviewed.  Constitutional:      Appearance: Normal appearance.  HENT:     Head: Normocephalic and atraumatic.  Eyes:     Conjunctiva/sclera: Conjunctivae normal.  Cardiovascular:     Rate and Rhythm: Normal rate and regular rhythm.  Pulmonary:     Effort: Pulmonary effort is normal.     Breath sounds: Normal breath sounds.  Skin:    General:  Skin is warm and dry.  Neurological:     General: No focal deficit present.     Mental Status: She is alert and oriented to person, place, and time.  Psychiatric:        Behavior: Behavior normal.      Impression and Plan:  Primary hypertension Assessment & Plan: Well-controlled on current.  Orders: -     CBC with Differential/Platelet; Future -     Comprehensive metabolic panel; Future  Mixed hyperlipidemia Assessment & Plan: Check lipids today, continue atorvastatin 40 mg daily.  Orders: -     Lipid panel; Future  Gastroesophageal reflux disease without esophagitis Assessment & Plan: Not requiring daily PPI therapy.   Severe episode of recurrent major depressive disorder, without psychotic features  [facility]) Assessment & Plan: Treatment resistant depression, being followed by psychiatry.  Orders: -     Vitamin B12; Future -     TSH; Future  Encounter for hepatitis C screening test for low risk patient -     Hepatitis C antibody; Future     Time spent:31 minutes reviewing chart, interviewing and examining patient and formulating plan of care.     [name], MD [facility]

## 2023-05-12 NOTE — Assessment & Plan Note (Signed)
 Not requiring daily PPI therapy.

## 2023-05-12 NOTE — Addendum Note (Signed)
 Addended by: Gertie Baron D on: 05/12/2023 04:19 PM   Modules accepted: Orders

## 2023-05-12 NOTE — Assessment & Plan Note (Signed)
 Well-controlled on current.

## 2023-05-13 ENCOUNTER — Ambulatory Visit: Payer: Medicare PPO | Admitting: Internal Medicine

## 2023-05-13 ENCOUNTER — Encounter: Payer: Medicare PPO | Admitting: Adult Health

## 2023-05-13 LAB — LIPID PANEL
Cholesterol: 169 mg/dL (ref <200)
HDL: 66 mg/dL (ref >=50)
LDL Cholesterol (Calc): 83 mg/dL
Non-HDL Cholesterol (Calc): 103 mg/dL (ref <130)
Total CHOL/HDL Ratio: 2.6 (calc) (ref <5.0)
Triglycerides: 107 mg/dL (ref <150)

## 2023-05-13 LAB — CBC WITH DIFFERENTIAL/PLATELET
Absolute Lymphocytes: 1015 {cells}/uL (ref 850–3900)
Absolute Monocytes: 532 {cells}/uL (ref 200–950)
Basophils Absolute: 49 {cells}/uL (ref 0–200)
Basophils Relative: 0.7 %
Eosinophils Absolute: 315 {cells}/uL (ref 15–500)
Eosinophils Relative: 4.5 %
HCT: 40.6 % (ref 35.0–45.0)
Hemoglobin: 13.9 g/dL (ref 11.7–15.5)
MCH: 32 pg (ref 27.0–33.0)
MCHC: 34.2 g/dL (ref 32.0–36.0)
MCV: 93.5 fL (ref 80.0–100.0)
MPV: 9.8 fL (ref 7.5–12.5)
Monocytes Relative: 7.6 %
Neutro Abs: 5089 {cells}/uL (ref 1500–7800)
Neutrophils Relative %: 72.7 %
Platelets: 284 10*3/uL (ref 140–400)
RBC: 4.34 10*6/uL (ref 3.80–5.10)
RDW: 12.3 % (ref 11.0–15.0)
Total Lymphocyte: 14.5 %
WBC: 7 10*3/uL (ref 3.8–10.8)

## 2023-05-13 LAB — COMPREHENSIVE METABOLIC PANEL
AG Ratio: 1.9 (calc) (ref 1.0–2.5)
ALT: 14 U/L (ref 6–29)
AST: 21 U/L (ref 10–35)
Albumin: 4 g/dL (ref 3.6–5.1)
Alkaline phosphatase (APISO): 53 U/L (ref 37–153)
BUN: 17 mg/dL (ref 7–25)
CO2: 29 mmol/L (ref 20–32)
Calcium: 9.6 mg/dL (ref 8.6–10.4)
Chloride: 102 mmol/L (ref 98–110)
Creat: 0.79 mg/dL (ref 0.60–1.00)
Globulin: 2.1 g/dL (ref 1.9–3.7)
Glucose, Bld: 102 mg/dL — ABNORMAL HIGH (ref 65–99)
Potassium: 3.8 mmol/L (ref 3.5–5.3)
Sodium: 140 mmol/L (ref 135–146)
Total Bilirubin: 0.2 mg/dL (ref 0.2–1.2)
Total Protein: 6.1 g/dL (ref 6.1–8.1)

## 2023-05-13 LAB — HEPATITIS C ANTIBODY: Hepatitis C Ab: NONREACTIVE

## 2023-05-13 LAB — VITAMIN B12: Vitamin B-12: 987 pg/mL (ref 200–1100)

## 2023-05-13 LAB — TSH: TSH: 1.17 m[IU]/L (ref 0.40–4.50)

## 2023-05-14 ENCOUNTER — Encounter: Payer: Self-pay | Admitting: Internal Medicine

## 2023-05-15 NOTE — Telephone Encounter (Signed)
 Please send

## 2023-05-20 ENCOUNTER — Ambulatory Visit: Payer: Medicare PPO | Admitting: Psychiatry

## 2023-05-20 ENCOUNTER — Ambulatory Visit: Payer: Medicare PPO

## 2023-05-20 VITALS — BP 117/62 | HR 76

## 2023-05-20 DIAGNOSIS — F411 Generalized anxiety disorder: Secondary | ICD-10-CM

## 2023-05-20 DIAGNOSIS — F339 Major depressive disorder, recurrent, unspecified: Secondary | ICD-10-CM

## 2023-05-20 DIAGNOSIS — G4700 Insomnia, unspecified: Secondary | ICD-10-CM | POA: Diagnosis not present

## 2023-05-20 NOTE — Progress Notes (Signed)
 NURSES NOTE:         Pt arrived for her 12th Spravato Treatment for treatment resistant depression, the starting dose was 56 mg (2 of the 28 mg nasal sprays) as tolerated. She is receiving  84 mg weekly currently. She usually has few words so hard to get a description of how she feels during treatment.  She is a patient of Yvette Rack, DNP so she will follow her throughout her treatments and follow ups. She has gotten better with using the inhalers. Pt's Spravato is ordered through [employer].  Spravato medication is stored at treatment center per REMS/FDA guidelines. The medication is required to be locked behind two doors per REMS/FDA protocol. Medication is also disposed of properly after each use per regulations. All documentation for REMS is completed and submitted per FDA/REMS requirements.          Brought pt back to treatment room. Began taking patient's vital signs at 10:00 AM 136/83, pulse 97. Pt reports taking Xanax 0.25 mg about 9:00 AM to help with her anxiety. Instructed patient to blow her nose if needed then recline back to a 45 degree angle. She was given first dose 28 mg nasal spray, pt administered in each nostril as directed and observed by nurse. Pt given a bottle of water, I advised it  was okay to have a mint or some water between doses. Waited 5 more minutes for the second and third dose. After all doses given pt did not complain of any nausea/vomiting. Pt was anxious about the higher dose again but not as bad, reassured her it should be close to the same feeling she felt on her previous treatments. Pt given bag to get chips, usually 2 small bags. Checked on pt a couple of times prior to her halfway mark seemed more relaxed today with treatment. Her  40 minute vitals at 10:48 AM, 115/76, pulse 67. Pt reports doing fine, no complaints voiced. Pt appeared to be less anxious. Explained she would be monitored for a total time of 120 minutes. Discharge vitals were taken at 12:00  PM 117/62, P 76. Dr. Jennelle Human saw pt today due to [facility] working remotely, he discussed treatment with her today.  Recommend she go home and sleep or just relax on the couch. No driving, no intense activities. Verbalized understanding. Nurse was with pt a total of 60 minutes for clinical assessment. Pt is scheduled next Wednesday to continue her weekly treatments. Instructed to call with any issues.     LOT [id number] EXP MAY 2027

## 2023-05-22 NOTE — Progress Notes (Signed)
 Jamie Gamble [id number] [birth year] 78 y.o.  Subjective:   Patient ID:  Jamie Gamble is a 78 y.o. (DOB Feb 28, 1946) female.  Chief Complaint:  Chief Complaint  Patient presents with   Follow-up   Depression    HPI Jamie Gamble presents to the office today for follow-up of treatment resistant depression (TRD).  Patient was administered Spravato 84 mg intranasally today. Patient was observed by provider throughout [facility] treatment. The patient experienced the typical dissociation which gradually resolved over the 2-hour period of observation. There were no complications. Specifically, the patient did not have any untoward side effects - feeling disconnected from themself, their thoughts, feelings and things around them, dizziness, nausea, feeling sleepy, decreased feeling of sensitivity (numbness) spinning sensation, feeling anxious, lack of energy, increased blood pressure, feeling happy or very excited, or headache. Blood pressures remained within normal ranges at the 40-minute and 2-hour follow-up intervals. By the time the 2-hour observation period was met the patient was alert and oriented and able to exit without assistance. Patient willing to continue Spravato administration for the treatment of resistant depression. See nursing note for further details.  No new concerns.   PHQ2-9    Flowsheet Row Office Visit from 05/12/2023 in [facility] [place] [facility] from 12/25/2022 in [facility] Video Visit from 02/11/2022 in [facility] [place] [facility] from 11/28/2021 in [facility] Office Visit from 08/13/2021 in [facility] Total Score 6 6 0 2 0  PHQ-9 Total Score 19 14 4 7  0      Flowsheet Row ED from 06/04/2021 in [facility] at [facility]  C-SSRS RISK CATEGORY No Risk        Review of Systems:  Review of  Systems  Cardiovascular:  Negative for palpitations.  Musculoskeletal:  Negative for gait problem.  Neurological:  Negative for tremors.  Psychiatric/Behavioral:         Please refer to HPI    Medications: I have reviewed the patient's current medications.  Current Outpatient Medications  Medication Sig Dispense Refill   ALPRAZolam (XANAX) 0.25 MG tablet Take 1 tablet (0.25 mg total) by mouth daily as needed for anxiety. 30 tablet 0   atorvastatin (LIPITOR) 40 MG tablet Take 1 tablet (40 mg total) by mouth at bedtime. 30 tablet 0   Esketamine HCl, 84 MG Dose, (SPRAVATO, 84 MG DOSE,) 28 MG/DEVICE SOPK Place 84 mg into the nose every 7 (seven) days. 3 each 4   FLUoxetine (PROZAC) 20 MG capsule Take 1 capsule (20 mg total) by mouth daily. 30 capsule 2   Glycerin-Hypromellose-PEG 400 (VISINE DRY EYE OP) Place 1 drop into both eyes daily as needed (dry eyes).     hydrochlorothiazide (MICROZIDE) 12.5 MG capsule Take 1 capsule (12.5 mg total) by mouth daily. 30 capsule 0   hydrOXYzine (ATARAX) 25 MG tablet Take 1 tablet (25 mg total) by mouth at bedtime as needed. 90 tablet 1   losartan (COZAAR) 25 MG tablet Take 1 tablet (25 mg total) by mouth daily. 30 tablet 0   pantoprazole (PROTONIX) 40 MG tablet Take 1 tablet (40 mg total) by mouth daily. 90 tablet 1   Probiotic Product (PROBIOTIC DAILY PO) Take 1 capsule by mouth daily.     QUEtiapine (SEROQUEL) 25 MG tablet Take 1 tablet (25 mg total) by mouth at bedtime. 90 tablet 1   traZODone (DESYREL) 100 MG tablet  Take two tablets at bedtime. 180 tablet 1   No current facility-administered medications for this visit.    Medication Side Effects: None  Allergies:  Allergies  Allergen Reactions   Esomeprazole Diarrhea and Other (See Comments)    Past Medical History:  Diagnosis Date   Depression    GERD (gastroesophageal reflux disease)    Hyperlipidemia    Hypertension     Past Medical History, Surgical history, Social history, and  Family history were reviewed and updated as appropriate.   Please see review of systems for further details on the patient's review from today.   Objective:   Physical Exam:  There were no vitals taken for this visit.  Physical Exam Constitutional:      General: She is not in acute distress. Musculoskeletal:        General: No deformity.  Neurological:     Mental Status: She is alert and oriented to person, place, and time.     Coordination: Coordination normal.  Psychiatric:        Attention and Perception: Attention and perception normal. She does not perceive auditory or visual hallucinations.        Mood and Affect: Mood is depressed. Affect is not labile, blunt, angry or inappropriate.        Speech: Speech normal.        Behavior: Behavior normal.        Thought Content: Thought content normal. Thought content is not paranoid or delusional. Thought content does not include homicidal or suicidal ideation. Thought content does not include suicidal plan.        Cognition and Memory: Cognition and memory normal.        Judgment: Judgment normal.     Comments: Insight intact     Lab Review:     Component Value Date/Time   NA 140 05/12/2023 1619   K 3.8 05/12/2023 1619   CL 102 05/12/2023 1619   CO2 29 05/12/2023 1619   GLUCOSE 102 (H) 05/12/2023 1619   BUN 17 05/12/2023 1619   BUN 15 07/06/2019 0000   CREATININE 0.79 05/12/2023 1619   CALCIUM 9.6 05/12/2023 1619   PROT 6.1 05/12/2023 1619   ALBUMIN 4.0 08/13/2021 0905   AST 21 05/12/2023 1619   ALT 14 05/12/2023 1619   ALKPHOS 48 08/13/2021 0905   BILITOT 0.2 05/12/2023 1619   GFRNONAA >60 06/04/2021 1154       Component Value Date/Time   WBC 7.0 05/12/2023 1619   RBC 4.34 05/12/2023 1619   HGB 13.9 05/12/2023 1619   HCT 40.6 05/12/2023 1619   PLT 284 05/12/2023 1619   MCV 93.5 05/12/2023 1619   MCH 32.0 05/12/2023 1619   MCHC 34.2 05/12/2023 1619   RDW 12.3 05/12/2023 1619   LYMPHSABS 0.7 08/13/2021 0905    MONOABS 0.3 08/13/2021 0905   EOSABS 315 05/12/2023 1619   BASOSABS 49 05/12/2023 1619    No results found for: "POCLITH", "LITHIUM"   No results found for: "PHENYTOIN", "PHENOBARB", "VALPROATE", "CBMZ"   .res Assessment: Plan:    Recommendations/Plan   Continue Spravato treatment.   Patient advised to contact office with any questions, adverse effects, or acute worsening in signs and symptoms.  Aren was seen today for follow-up and depression.  Diagnoses and all orders for this visit:  Recurrent major depression resistant to treatment (HCC)  Generalized anxiety disorder  Insomnia, unspecified type   Satisfied with current treatment plan.  Tolerating current meds and desires no med  changes.  Patient was administered Spravato 84 mg intranasally today.  The patient experienced the typical dissociation which gradually resolved over the 2-hour period of observation.  There were no complications.  Specifically the patient did not have nausea or vomiting or headache.  Blood pressures remained within normal ranges at the 40-minute and 2-hour follow-up intervals.  By the time the 2-hour observation period was met the patient was alert and oriented and able to exit without assistance.  Patient feels the Spravato administration is helpful for the treatment resistant depression and would like to continue the treatment.  See nursing note for further details.  FU next Spravato tx.  Meredith Staggers, MD, DFAPA    Please see After Visit Summary for patient specific instructions.  Future Appointments  Date Time Provider Department Center  05/27/2023 10:00 AM Cottle, Steva Ready., MD [facility] None  05/27/2023 10:00 AM CP-NURSE [facility] None  06/10/2023 11:00 AM Robley Fries, PhD CP-[facility] None  07/13/2023 11:00 AM Robley Fries, PhD CP-CP None    No orders of the defined types were placed in this encounter.   -------------------------------

## 2023-05-27 ENCOUNTER — Ambulatory Visit: Payer: Medicare PPO

## 2023-05-27 ENCOUNTER — Ambulatory Visit: Payer: Medicare PPO | Admitting: Psychiatry

## 2023-05-27 ENCOUNTER — Encounter: Payer: Self-pay | Admitting: Psychiatry

## 2023-05-27 VITALS — BP 102/65 | HR 74

## 2023-05-27 DIAGNOSIS — F339 Major depressive disorder, recurrent, unspecified: Secondary | ICD-10-CM

## 2023-05-27 DIAGNOSIS — G4700 Insomnia, unspecified: Secondary | ICD-10-CM

## 2023-05-27 DIAGNOSIS — F411 Generalized anxiety disorder: Secondary | ICD-10-CM | POA: Diagnosis not present

## 2023-05-27 MED ORDER — FLUOXETINE HCL 40 MG PO CAPS: 40.0000 mg | ORAL_CAPSULE | Freq: Every day | ORAL | 0 refills | Status: DC

## 2023-05-27 NOTE — Progress Notes (Signed)
 NURSES NOTE:         Pt arrived for her 52 th Spravato Treatment for treatment resistant depression, the starting dose was 56 mg (2 of the 28 mg nasal sprays) as tolerated. She is receiving  84 mg weekly currently. She usually has few words so hard to get a description of how she feels during treatment.  She is a patient of Yvette Rack, DNP so she will follow her throughout her treatments and follow ups. She has gotten better with using the inhalers. Pt's Spravato is ordered through [employer].  Spravato medication is stored at treatment center per REMS/FDA guidelines. The medication is required to be locked behind two doors per REMS/FDA protocol. Medication is also disposed of properly after each use per regulations. All documentation for REMS is completed and submitted per FDA/REMS requirements.          Brought pt back to treatment room. Began taking patient's vital signs at 10:00 AM 121/78, pulse 84, 96% O2 sats. Pt reports taking Xanax 0.25 mg about 9:00 AM to help with her anxiety. Instructed patient to blow her nose if needed then recline back to a 45 degree angle. She was given first dose 28 mg nasal spray, pt administered in each nostril as directed and observed by nurse. Pt given a bottle of water, I advised it  was okay to have a mint or some water between doses. Waited 5 more minutes for the second and third dose. After all doses given pt did not complain of any nausea/vomiting. Pt was anxious about the higher dose again but not as bad, reassured her it should be close to the same feeling she felt on her previous treatments. Pt given bag to get chips, usually 2 small bags. Checked on pt a couple of times prior to her halfway mark seemed more relaxed today with treatment. Her  40 minute vitals at 10:48 AM, 120/91, pulse 68. Pt reports doing fine, no complaints voiced. Pt appeared to be less anxious. Explained she would be monitored for a total time of 120 minutes. Discharge vitals were  taken at 12:00 PM 117/62, P 76. Dr. Jennelle Human saw pt today due to [facility] working remotely, he discussed treatment with her today.  Recommend she go home and sleep or just relax on the couch. No driving, no intense activities. Verbalized understanding. Nurse was with pt a total of 60 minutes for clinical assessment. Pt is scheduled next Wednesday to continue her weekly treatments. Instructed to call with any issues. Dr. Jennelle Human advises pt to set up apt with [facility] for follow up of her medications.    LOT [id number] EXP MAY 2027

## 2023-05-27 NOTE — Progress Notes (Signed)
 Jamie Gamble [id number] August 28, 1945 78 y.o.  Subjective:   Patient ID:  Jamie Gamble is a 78 y.o. (DOB 1946/01/31) female.  Chief Complaint:  Chief Complaint  Patient presents with   Follow-up   Depression   Memory Loss   Anxiety    HPI SINIYAH Gamble presents to the office today for follow-up of treatment resistant depression (TRD).  05/20/23 appt :  Patient was administered Spravato 84 mg intranasally today. Patient was observed by provider throughout [facility] treatment. The patient experienced the typical dissociation which gradually resolved over the 2-hour period of observation. There were no complications. Specifically, the patient did not have any untoward side effects - feeling disconnected from themself, their thoughts, feelings and things around them, dizziness, nausea, feeling sleepy, decreased feeling of sensitivity (numbness) spinning sensation, feeling anxious, lack of energy, increased blood pressure, feeling happy or very excited, or headache. Blood pressures remained within normal ranges at the 40-minute and 2-hour follow-up intervals. By the time the 2-hour observation period was met the patient was alert and oriented and able to exit without assistance. Patient willing to continue Spravato administration for the treatment of resistant depression. See nursing note for further details. No new concerns. Plan no med changes  05/27/23 appt : Med: she increased fluoxetine to 40 mg daily, alprazolam 0.25 mg daily prn, hydroxyzine 25 mg HS, quetiapine 25 mg HS, trazodone 200 mg HS. Patient was administered Spravato 84 mg intranasally today. Patient was observed by provider throughout [facility] treatment. The patient experienced the typical dissociation which gradually resolved over the 2-hour period of observation. There were no complications. Specifically, the patient did not have any untoward side effects - feeling disconnected from themself, their thoughts, feelings and things around  them, dizziness, nausea, feeling sleepy, decreased feeling of sensitivity (numbness) spinning sensation, feeling anxious, lack of energy, increased blood pressure, feeling happy or very excited, or headache. Blood pressures remained within normal ranges at the 40-minute and 2-hour follow-up intervals. By the time the 2-hour observation period was met the patient was alert and oriented and able to exit without assistance. Patient willing to continue Spravato administration for the treatment of resistant depression. See nursing note for further details. No new concerns. Plan no med changes  PHQ2-9    Flowsheet Row Office Visit from 05/12/2023 in [facility] [place] [facility] from 12/25/2022 in [facility] Video Visit from 02/11/2022 in [facility] [place] Counselor from 11/28/2021 in [facility] Office Visit from 08/13/2021 in [facility] Total Score 6 6 0 2 0  PHQ-9 Total Score 19 14 4 7  0      Flowsheet Row ED from 06/04/2021 in [facility] Emergency Department at [facility]  C-SSRS RISK CATEGORY No Risk        Review of Systems:  Review of Systems  Cardiovascular:  Negative for palpitations.  Musculoskeletal:  Negative for gait problem.  Neurological:  Negative for tremors.  Psychiatric/Behavioral:  Positive for dysphoric mood.        Please refer to HPI    Medications: I have reviewed the patient's current medications.  Current Outpatient Medications  Medication Sig Dispense Refill   ALPRAZolam (XANAX) 0.25 MG tablet Take 1 tablet (0.25 mg total) by mouth daily as needed for anxiety. 30 tablet 0   atorvastatin (LIPITOR) 40 MG tablet Take 1 tablet (40 mg total) by mouth at bedtime. 30 tablet 0  Esketamine HCl, 84 MG Dose, (SPRAVATO, 84 MG DOSE,) 28 MG/DEVICE SOPK Place 84 mg into the nose every 7 (seven) days. 3 each 4   FLUoxetine  (PROZAC) 40 MG capsule Take 1 capsule (40 mg total) by mouth daily. 90 capsule 0   Glycerin-Hypromellose-PEG 400 (VISINE DRY EYE OP) Place 1 drop into both eyes daily as needed (dry eyes).     hydrochlorothiazide (MICROZIDE) 12.5 MG capsule Take 1 capsule (12.5 mg total) by mouth daily. 30 capsule 0   hydrOXYzine (ATARAX) 25 MG tablet Take 1 tablet (25 mg total) by mouth at bedtime as needed. 90 tablet 1   losartan (COZAAR) 25 MG tablet Take 1 tablet (25 mg total) by mouth daily. 30 tablet 0   pantoprazole (PROTONIX) 40 MG tablet Take 1 tablet (40 mg total) by mouth daily. 90 tablet 1   Probiotic Product (PROBIOTIC DAILY PO) Take 1 capsule by mouth daily.     QUEtiapine (SEROQUEL) 25 MG tablet Take 1 tablet (25 mg total) by mouth at bedtime. 90 tablet 1   traZODone (DESYREL) 100 MG tablet Take two tablets at bedtime. 180 tablet 1   No current facility-administered medications for this visit.    Medication Side Effects: None  Allergies:  Allergies  Allergen Reactions   Esomeprazole Diarrhea and Other (See Comments)    Past Medical History:  Diagnosis Date   Depression    GERD (gastroesophageal reflux disease)    Hyperlipidemia    Hypertension     Past Medical History, Surgical history, Social history, and Family history were reviewed and updated as appropriate.   Please see review of systems for further details on the patient's review from today.   Objective:   Physical Exam:  There were no vitals taken for this visit.  Physical Exam Constitutional:      General: She is not in acute distress. Musculoskeletal:        General: No deformity.  Neurological:     Mental Status: She is alert and oriented to person, place, and time.     Coordination: Coordination normal.  Psychiatric:        Attention and Perception: Attention and perception normal. She does not perceive auditory or visual hallucinations.        Mood and Affect: Mood is depressed. Affect is not labile, blunt,  angry or inappropriate.        Speech: Speech normal.        Behavior: Behavior is slowed.        Thought Content: Thought content normal. Thought content is not paranoid or delusional. Thought content does not include homicidal or suicidal ideation. Thought content does not include suicidal plan.        Cognition and Memory: Memory normal. Cognition is impaired.        Judgment: Judgment normal.     Comments: Insight intact     Lab Review:     Component Value Date/Time   NA 140 05/12/2023 1619   K 3.8 05/12/2023 1619   CL 102 05/12/2023 1619   CO2 29 05/12/2023 1619   GLUCOSE 102 (H) 05/12/2023 1619   BUN 17 05/12/2023 1619   BUN 15 07/06/2019 0000   CREATININE 0.79 05/12/2023 1619   CALCIUM 9.6 05/12/2023 1619   PROT 6.1 05/12/2023 1619   ALBUMIN 4.0 08/13/2021 0905   AST 21 05/12/2023 1619   ALT 14 05/12/2023 1619   ALKPHOS 48 08/13/2021 0905   BILITOT 0.2 05/12/2023 1619   GFRNONAA >60 06/04/2021  1154       Component Value Date/Time   WBC 7.0 05/12/2023 1619   RBC 4.34 05/12/2023 1619   HGB 13.9 05/12/2023 1619   HCT 40.6 05/12/2023 1619   PLT 284 05/12/2023 1619   MCV 93.5 05/12/2023 1619   MCH 32.0 05/12/2023 1619   MCHC 34.2 05/12/2023 1619   RDW 12.3 05/12/2023 1619   LYMPHSABS 0.7 08/13/2021 0905   MONOABS 0.3 08/13/2021 0905   EOSABS 315 05/12/2023 1619   BASOSABS 49 05/12/2023 1619    No results found for: "POCLITH", "LITHIUM"   No results found for: "PHENYTOIN", "PHENOBARB", "VALPROATE", "CBMZ"   .res Assessment: Plan:    Recommendations/Plan   Continue Spravato treatment.   Patient advised to contact office with any questions, adverse effects, or acute worsening in signs and symptoms.  Maryanne was seen today for follow-up, depression, memory loss and anxiety.  Diagnoses and all orders for this visit:  Generalized anxiety disorder  Recurrent major depression resistant to treatment (HCC) -     FLUoxetine (PROZAC) 40 MG capsule; Take 1 capsule  (40 mg total) by mouth daily.  Insomnia, unspecified type    Satisfied with current treatment plan.  Tolerating current meds and desires no med changes but she has increased fluoxetine to 40 mg daily. RX sent per her request.  Patient was administered Spravato 84 mg intranasally today.  The patient experienced the typical dissociation which gradually resolved over the 2-hour period of observation.  There were no complications.  Specifically the patient did not have nausea or vomiting or headache.  Blood pressures remained within normal ranges at the 40-minute and 2-hour follow-up intervals.  By the time the 2-hour observation period was met the patient was alert and oriented and able to exit without assistance.  Patient feels the Spravato administration is helpful for the treatment resistant depression and would like to continue the treatment.  See nursing note for further details.  Needs to sched med eval appt with Edison Pace NP.  Pending. Has appt with Dr. Farrel Demark pending.  FU next Spravato tx.  Meredith Staggers, MD, DFAPA    Please see After Visit Summary for patient specific instructions.  Future Appointments  Date Time Provider Department Center  06/02/2023  2:30 PM Mozingo, Thereasa Solo, NP CP-CP None  06/10/2023 11:00 AM Robley Fries, PhD CP-[facility] None  07/13/2023 11:00 AM Robley Fries, PhD CP[facility] None    No orders of the defined types were placed in this encounter.   -------------------------------

## 2023-05-30 DIAGNOSIS — G4733 Obstructive sleep apnea (adult) (pediatric): Secondary | ICD-10-CM | POA: Diagnosis not present

## 2023-06-02 ENCOUNTER — Other Ambulatory Visit: Payer: Self-pay | Admitting: Internal Medicine

## 2023-06-02 ENCOUNTER — Telehealth: Admitting: Adult Health

## 2023-06-02 ENCOUNTER — Encounter: Payer: Self-pay | Admitting: Adult Health

## 2023-06-02 DIAGNOSIS — F339 Major depressive disorder, recurrent, unspecified: Secondary | ICD-10-CM

## 2023-06-02 DIAGNOSIS — F411 Generalized anxiety disorder: Secondary | ICD-10-CM

## 2023-06-02 DIAGNOSIS — G4700 Insomnia, unspecified: Secondary | ICD-10-CM

## 2023-06-02 NOTE — Progress Notes (Signed)
 Jamie Gamble [id number] 05-Feb-1946 78 y.o.  Virtual Visit via Video Note  I connected with pt @ on 06/02/23 at  2:30 PM EDT by a video enabled telemedicine application and verified that I am speaking with the correct person using two identifiers.   I discussed the limitations of evaluation and management by telemedicine and the availability of in person appointments. The patient expressed understanding and agreed to proceed.  I discussed the assessment and treatment plan with the patient. The patient was provided an opportunity to ask questions and all were answered. The patient agreed with the plan and demonstrated an understanding of the instructions.   The patient was advised to call back or seek an in-person evaluation if the symptoms worsen or if the condition fails to improve as anticipated.  I provided 25 minutes of non-face-to-face time during this encounter.  The patient was located at home.  The provider was located at [facility].   Dorothyann Gibbs, NP   Subjective:   Patient ID:  Jamie Gamble is a 79 y.o. (DOB 13-Sep-1945) female.  Chief Complaint: No chief complaint on file.   HPI Jamie Gamble presents for follow-up of GAD, insomnia and MDD.  Seeing therapist - Dr. Farrel Demark   Describes mood today as "no different". Pleasant. Flat. Denies tearfulness. Mood symptoms - reports depression and anxiety. Reports decreased interest and motivation. Denies irritability. Denies panic attacks. Reports some worry, rumination, and over thinking - "about my health" - "where I can go from here". Mood is lower. Stating "I wonder where I'm going next". She does not want to continue Spravato treatments at this time - "they are not helpful". Reports taking medications as prescribed - willing to consider other options. Energy levels lower. Active, does not have a regular exercise routine.  Enjoys some usual interests and activities. Single. Lives alone. No family local.  Appetite  decreased. Weight gain - 174 from 180 pounds. Reports sleep as consistent. Averages 5 to 6 hours of broken sleep - reporting nightmares - using CPAP nightly. Denies daytime napping. Reports focus and concentration difficulties. Completing minimal tasks. Managing minimal aspects of household. Retired. Denies SI or HI.   Denies AH or VH. Denies self harm. Denies substance use.   Previous ECT treatment.  Review of Systems:  Review of Systems  Musculoskeletal:  Negative for gait problem.  Neurological:  Negative for tremors.  Psychiatric/Behavioral:         Please refer to HPI    Medications: I have reviewed the patient's current medications.  Current Outpatient Medications  Medication Sig Dispense Refill   ALPRAZolam (XANAX) 0.25 MG tablet Take 1 tablet (0.25 mg total) by mouth daily as needed for anxiety. 30 tablet 0   atorvastatin (LIPITOR) 40 MG tablet Take 1 tablet (40 mg total) by mouth at bedtime. 30 tablet 0   Esketamine HCl, 84 MG Dose, (SPRAVATO, 84 MG DOSE,) 28 MG/DEVICE SOPK Place 84 mg into the nose every 7 (seven) days. 3 each 4   FLUoxetine (PROZAC) 40 MG capsule Take 1 capsule (40 mg total) by mouth daily. 90 capsule 0   Glycerin-Hypromellose-PEG 400 (VISINE DRY EYE OP) Place 1 drop into both eyes daily as needed (dry eyes).     hydrochlorothiazide (MICROZIDE) 12.5 MG capsule Take 1 capsule (12.5 mg total) by mouth daily. 30 capsule 0   hydrOXYzine (ATARAX) 25 MG tablet Take 1 tablet (25 mg total) by mouth at bedtime as needed. 90 tablet 1   losartan (COZAAR)  25 MG tablet Take 1 tablet (25 mg total) by mouth daily. 30 tablet 0   pantoprazole (PROTONIX) 40 MG tablet Take 1 tablet (40 mg total) by mouth daily. 90 tablet 1   Probiotic Product (PROBIOTIC DAILY PO) Take 1 capsule by mouth daily.     QUEtiapine (SEROQUEL) 25 MG tablet Take 1 tablet (25 mg total) by mouth at bedtime. 90 tablet 1   traZODone (DESYREL) 100 MG tablet Take two tablets at bedtime. 180 tablet 1   No  current facility-administered medications for this visit.    Medication Side Effects: None  Allergies:  Allergies  Allergen Reactions   Esomeprazole Diarrhea and Other (See Comments)    Past Medical History:  Diagnosis Date   Depression    GERD (gastroesophageal reflux disease)    Hyperlipidemia    Hypertension     Family History  Problem Relation Age of Onset   Depression Mother    Cancer Father    Depression Brother     Social History   Socioeconomic History   Marital status: Single    Spouse name: Not on file   Number of children: 0   Years of education: Not on file   Highest education level: Doctorate  Occupational History   Occupation: Retired [employer] Employee  Tobacco Use   Smoking status: Former   Smokeless tobacco: Never  Advertising account planner   Vaping status: Never Used  Substance and Sexual Activity   Alcohol use: Not Currently   Drug use: Never   Sexual activity: Not on file  Other Topics Concern   Not on file  Social History Narrative   Not on file   Social Drivers of Health   Financial Resource Strain: Low Risk  (05/06/2023)   Overall Financial Resource Strain (CARDIA)    Difficulty of Paying Living Expenses: Not very hard  Food Insecurity: No Food Insecurity (05/06/2023)   Hunger Vital Sign    Worried About Running Out of Food in the Last Year: Never true    Ran Out of Food in the Last Year: Never true  Transportation Needs: Patient Declined (05/06/2023)   PRAPARE - Transportation    Lack of Transportation (Medical): Patient declined    Lack of Transportation (Non-Medical): Patient declined  Physical Activity: Unknown (05/06/2023)   Exercise Vital Sign    Days of Exercise per Week: 0 days    Minutes of Exercise per Session: Not on file  Stress: Stress Concern Present (05/06/2023)   [employer] of Occupational Health - Occupational Stress Questionnaire    Feeling of Stress : To some extent  Social Connections: Unknown (05/06/2023)   Social  Connection and Isolation Panel [NHANES]    Frequency of Communication with Friends and Family: Twice a week    Frequency of Social Gatherings with Friends and Family: Once a week    Attends Religious Services: Patient declined    Database administrator or Organizations: Yes    Attends Engineer, structural: More than 4 times per year    Marital Status: Never married  Intimate Partner Violence: Unknown (06/28/2021)   Received from [employer], [facility]   HITS    Physically Hurt: Not on file    Insult or Talk Down To: Not on file    Threaten Physical Harm: Not on file    Scream or Curse: Not on file    Past Medical History, Surgical history, Social history, and Family history were reviewed and updated as appropriate.  Please see review of systems for further details on the patient's review from today.   Objective:   Physical Exam:  There were no vitals taken for this visit.  Physical Exam Constitutional:      General: She is not in acute distress. Musculoskeletal:        General: No deformity.  Neurological:     Mental Status: She is alert and oriented to person, place, and time.     Coordination: Coordination normal.  Psychiatric:        Attention and Perception: Attention and perception normal. She does not perceive auditory or visual hallucinations.        Mood and Affect: Affect is not labile, blunt, angry or inappropriate.        Speech: Speech normal.        Behavior: Behavior normal.        Thought Content: Thought content normal. Thought content is not paranoid or delusional. Thought content does not include homicidal or suicidal ideation. Thought content does not include homicidal or suicidal plan.        Cognition and Memory: Cognition and memory normal.        Judgment: Judgment normal.     Comments: Insight intact     Lab Review:     Component Value Date/Time   NA 140 05/12/2023 1619   K 3.8 05/12/2023 1619   CL 102 05/12/2023 1619   CO2 29  05/12/2023 1619   GLUCOSE 102 (H) 05/12/2023 1619   BUN 17 05/12/2023 1619   BUN 15 07/06/2019 0000   CREATININE 0.79 05/12/2023 1619   CALCIUM 9.6 05/12/2023 1619   PROT 6.1 05/12/2023 1619   ALBUMIN 4.0 08/13/2021 0905   AST 21 05/12/2023 1619   ALT 14 05/12/2023 1619   ALKPHOS 48 08/13/2021 0905   BILITOT 0.2 05/12/2023 1619   GFRNONAA >60 06/04/2021 1154       Component Value Date/Time   WBC 7.0 05/12/2023 1619   RBC 4.34 05/12/2023 1619   HGB 13.9 05/12/2023 1619   HCT 40.6 05/12/2023 1619   PLT 284 05/12/2023 1619   MCV 93.5 05/12/2023 1619   MCH 32.0 05/12/2023 1619   MCHC 34.2 05/12/2023 1619   RDW 12.3 05/12/2023 1619   LYMPHSABS 0.7 08/13/2021 0905   MONOABS 0.3 08/13/2021 0905   EOSABS 315 05/12/2023 1619   BASOSABS 49 05/12/2023 1619    No results found for: "POCLITH", "LITHIUM"   No results found for: "PHENYTOIN", "PHENOBARB", "VALPROATE", "CBMZ"   .res Assessment: Plan:    Plan:  Seeing [name] for therapy   PDMP reviewed  Continue:  Trazadone 100mg  - take one tablet at bedtime for sleep Seroquel 25mg  at hs  Hydroxyzine 25mg  - 1 at hs for sleep   Prozac 40mg  daily  D/C Spravato - ineffective for mood symptoms  Melatonin 5mg  at hs  Followed by [name] - therapy   RTC 4 weeks   15 minutes spent dedicated to the care of this patient on the date of this encounter to include pre-visit review of records, ordering of medication, post visit documentation, and face-to-face time with the patient discussing MDD, anxiety and insomnia. Discussed continuing current medication regimen.  Patient advised to contact office with any questions, adverse effects, or acute worsening in signs and symptoms.  Discussed potential metabolic side effects associated with atypical antipsychotics, as well as potential risk for movement side effects. Advised pt to contact office if movement side effects occur.   There  are no diagnoses linked to this encounter.    Please see After Visit Summary for patient specific instructions.  Future Appointments  Date Time Provider Department Center  06/02/2023  2:30 PM Levander Katzenstein, Thereasa Solo, NP CP-CP None  06/03/2023 10:00 AM Cottle, Steva Ready., MD CP-[facility] None  06/03/2023 10:00 AM CP-NURSE CP-CP None  06/10/2023 11:00 AM Robley Fries, PhD CP-CP None  07/13/2023 11:00 AM Robley Fries, PhD CP-CP None    No orders of the defined types were placed in this encounter.     -------------------------------

## 2023-06-03 ENCOUNTER — Encounter: Admitting: Psychiatry

## 2023-06-03 ENCOUNTER — Encounter

## 2023-06-10 ENCOUNTER — Ambulatory Visit (INDEPENDENT_AMBULATORY_CARE_PROVIDER_SITE_OTHER): Payer: Medicare PPO | Admitting: Psychiatry

## 2023-06-10 DIAGNOSIS — F339 Major depressive disorder, recurrent, unspecified: Secondary | ICD-10-CM

## 2023-06-10 DIAGNOSIS — F40232 Fear of other medical care: Secondary | ICD-10-CM

## 2023-06-10 DIAGNOSIS — R69 Illness, unspecified: Secondary | ICD-10-CM

## 2023-06-10 DIAGNOSIS — Z9189 Other specified personal risk factors, not elsewhere classified: Secondary | ICD-10-CM

## 2023-06-10 DIAGNOSIS — F411 Generalized anxiety disorder: Secondary | ICD-10-CM | POA: Diagnosis not present

## 2023-06-10 DIAGNOSIS — G4700 Insomnia, unspecified: Secondary | ICD-10-CM | POA: Diagnosis not present

## 2023-06-10 NOTE — Progress Notes (Signed)
 Psychotherapy Progress Note [facility] [name], PhD LP  Patient ID: Jamie Gamble [facility])    MRN: [id number] Therapy format: Individual psychotherapy Date: 06/10/2023      Start: 11:11a     Stop: 11:56a     Time Spent: 45 min + consultation time with psychiatry Location: Telehealth visit -- I connected with this patient by an approved telecommunication method (video), with her informed consent, and verifying identity and patient privacy.  I was located at my office and patient at her home.  As needed, we discussed the limitations, risks, and security and privacy concerns associated with telehealth service, including the availability and conditions which currently govern in-person appointments and the possibility that 3rd-party payment may not be fully guaranteed and she may be responsible for charges.  After she indicated understanding, we proceeded with the session.  Also discussed treatment planning, as needed, including ongoing verbal agreement with the plan, the opportunity to ask and answer all questions, her demonstrated understanding of instructions, and her readiness to call the office should symptoms worsen or she feels she is in a crisis state and needs more immediate and tangible assistance.   Session narrative (presenting needs, interim history, self-report of stressors and symptoms, applications of prior therapy, status changes, and interventions made in session) Still met on video, but more interactive.  Notes she's off Spravato now, didn't seem to be working, so called it off.  Now scared whether anything will work.  Remains on Prozac, Seroquel, and trazodone, but no .  Appetite off, but eats as a matter of maintenance.  Acknowledges restricted sense of taste, which might affect appetite in itself.  Stuck in a cycle of feeling no interest so not trying, supportively confronted about maintaining anxiety and depression that way.  Encouraged to please consider risking  some level of discomfort to program going beyond her shut-in lifestyle, take her own turn at inviting time with Jamie Gamble, recognize the "dirty little secret" of depression that people who decline a lot and thwart themselves from initiating with friends actually teach their friends to leave them, whereas doing ome of the asking and initiating rewards friends for being steadfast and renews their stamina to be supportive.  Encouraged again to prevent conclusion-jumping about Jamie Gamble being "busy" but recognize she's been willing, notably during 2/wk Spravato visits.  Probed hypothetical interests and activities, like historical museum, a walk around without having to socialize with other people, or just bringing in [place].  Steadfastly refuses consent to let Tx consult with Jamie Gamble to pass along ideas, admittedly because it feels like committing herself beyond her strength.  But tentatively accepts a challenge to her to bring it up herself with Jamie HatchMay I challenge you to do that?"  "Yes, you can challenge me...").  Noted skepticism that any medication intervention will be the single-agent solution to her anxiety and depression, probed her experience of Spravato, which pretty much reveals that she was too keyed up about the procedure each time, both about whether it would work and whether it would cause adverse effects, for it to be an emotionally soothing experience.  Pointed out to her that that much seems to have been true about any "power" therapy, including ECT, and asked her to consider whether her core problem is her susceptibility to getting hypnotized by worry, and whether her own brain should be considered powerful enough to cancel most anything she tries, unless she can reconcile her attitude toward it and practice trust.  The greatest  risk to her recovery seems to be that the more she shies away, the more she shies away.  That said, it does appear -- despite her negative narrative, that she is both more flexible  and more tolerant in today's interaction than she has been in sessions conducted before her Spravato experience.  Observations from psychiatry and friend Jamie Gamble reportedly were that she was more relaxed and interactive under the influence of Spravato, even if she could not see or admit it.  Encouraged to keep taking opportunities to diversify her daily experience.  From a process standpoint, notable that Pt made the whole 45 minute session today, albeit with some amount of letting the phone fall away and covering her tired-looking eyes to dampen engagement.  And she did crack a smile or two being kidded with about Tx going "over the line" recommending things, suggesting that self-effacing and provocative humor could help motivate her further.  On the pharma front, she also noted that Xanax taken before treatments did help some.  Conferring with psychiatrist seems clear Pt may be the last to acknowledge benefit to anything, theory being that that she so deeply fears being held more responsible for her own welfare once it's agreed she is improving.  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  Shared with psychiatry post-session, including raising the question if retrying a more motivating dopaminergic agent like Wellbutrin could serve -- noted Pt's allegation it constipated her, but this could easily have been misattributed, and visible on the chart that it was discontinued 05/12/23 but unable to find reason.   Alternatively, her case resembles those I have seen where an MAOI proved very helpful.  Agreed to maintain scheduled times in 2 weeks and 2 weeks after that, though they are still video visits, and not sufficiently planned out to hold good hope of regularly engaging and stimulating her to recovery behaviors.  Best hope, in my opinion, is that her good friend Jamie Gamble remain engaged and get her to use what flexibility she's gained to reactivate outside the house.  Therapeutic modalities: Cognitive Behavioral Therapy, Solution-Oriented/Positive Psychology, Motivational Interviewing, and Interpersonal  Mental Status/Observations:  Appearance:   Casual   , pale  Behavior:  Resistant  Motor:  Normal  Speech/Language:   Clear and Coherent and no poverty of content today  Affect:  Appropriate  Mood:  anxious and depressed  Thought process:  normal  Thought content:    Rumination  Sensory/Perceptual disturbances:    Diminished sense of taste  Orientation:  Fully oriented  Attention:  Good    Concentration:  Good  Memory:  WNL  Insight:    Good  Judgment:   Fair  Impulse Control:  Good   Risk Assessment: Danger to Self: No Self-injurious Behavior: No Danger to Others: No Physical Aggression / Violence: No Duty to Warn: No Access to Firearms a concern: No  Assessment of progress:  stabilized  Diagnosis:   ICD-10-CM   1. Recurrent major depression resistant to treatment (  HCC)  F33.9     2. Generalized anxiety disorder  F41.1     3. Insomnia, unspecified type  G47.00     4. r/o Avoidant Personality  R69     5. Fear of other medical care, particularly ECT  F40.232     6. At increased risk for social isolation  Z91.89      Plan:  Safety & welfare plan -- Continue pledge of no harm, see one or the other mental health provider at least 2/month until returning to normal activity outside the home.  Jamie Gamble remains POA and cleared to confer in emergencies but not yet for planning  interventions on her behalf.  Recommend all treatment team members stand willing to contact Jamie Gamble if any indications of compromised safety, or poor comprehension or judgment. Still priority advice to Continue supplements -- NAC regularly in the morning, Mg L-theonate regularly in the evening Make a daily practice of seeing some sunlight in the morning Try to get out for something, not just have it all delivered Try to allow a neighbor to interact briefly, and say so if she'd like to get back home, trust it's not "rude" but honest Notice and dispute "I have to, but I can't" thinking wrestling with tasks -- either determine I don't have to after all, or I actually can do a little something Notice any difference these make, however small Prayer tone -- Asking God to "take away" depression is honest, but asking help seeing better things and raising stronger responses is more constructive Behavioral/social activation -- advise be in touch with Jamie Gamble, try to contact Toastmaster[employer] friends, and entertain asking someone to come over or get out with them somehow Sleep regulation -- Should continue orange glasses QPM to facilitate circadian signaling, sleep readiness, and sleep hardiness.  Best recommendation dosing sleep-assisting meds up to 1 hr before bed to help her move through initial insomnia and insomnia anxiety.  Option add melatonin 2-5mg  if needed 1-2 hr ahead of bed.  Practice sleep hygiene, including comfortable arrangements, reduced lighting, and all caffeine stopped well ahead of bedtime (at least 6 hrs).  Use soothing imagery PRN for DFA.  Should adhere to CPAP and ensure proper maintenance.  For worry control, option to externalize worry thoughts by writing them down and then consciously decide whether to stay up and think -- out of bed -- or "park" them and sleep.  Acknowledge and table any worry about whether she will sleep -- trust the natural sleep drive to come around, and if too wakeful, get out of  bed for anything not too stimulating.  Trust variations in sleep experience, e.g., an uptick in dreams, as natural progression of catching up deficits and processing emotional life issues. Physiological mood factors -- Start the day with good lighting, movement/stretching, and a few minutes grounding, outdoors if available/desired.  Vitamin levels are normal, but improved B12 and D could still be helpful.  Should get some moderate exercise as able, even just walking and stretching, to combat depressogenic inactivity effects. Self-esteem and intrusive guilt/shame thoughts -- PRN practices of blessing-counting and listing ways people have told/shown her she is lovable/capable.  No requirement to believe or agree, just consider.  Seek to notice and give self credit when she does anything constructive or principled for things that matter to her wellbeing or sense of purpose and for any efforts that are better than full avoidance.  As needed, self-affirm God would not be punishing her, it's a trick of  depression to think it. Activities and socialization -- Continue to strongly recommend at least minimal outings to break rumination and stimulate her emotional and behavioral systems.  As able, develop activities of interest.  Self-monitor for broad demotivation and reach out.  Known options in friend Jamie Gamble, Field seismologist, and church activities at 3 locations, ranging from chair yoga to entertainment/fellowship group to more theological and philosophical study.  Maintain/restore system of friends beyond [place], which may include anyone available at housing community.  Dispute worry thoughts of being judged, and enter interpersonal space with permission to feel not so up to it, either.  [employer] can be available for supportive calls, assistance services, and possible facility-based recreation.  Home care companions when needed.  Self-monitor for impulse to more fully isolate, and in all involvements practice  permission to be authentic about her tolerance for spending time in view or the presence of others.  OK with employment if regains interest. Placement concerns -- Independent living with PRN assistance should be fine for the foreseeable future, though she could use the interpersonal stimulus of more regular contact inside or outside the confines of her home.  Available on request and consent to interpret her condition empowered persons among her family and friends. Specialized therapies and medical procedure anxiety -- ECT and Spravato now on hold.  Despite cognitive recovery after ECT, the ideas of shock, anesthesia, and potentially hallucinogenic tranquilizer are all highly anxiety-provoking to her.  Pt understands and accepts the burden to notify treatment team of relapse in dark/dreadful mood, severe demotivation, or intractable insomnia.  Ask all necessary questions of health care, make sure to adequately comprehend recommendations rather than catastrophize without knowing.  For any procedure accepted, imagine the competence and caring of health care personnel in action, especially if it involves anesthesia and other vulnerable-making procedures. Weight management -- Concur with modified Weight Watchers approach and improved food choices at her interest.  Encourage to stay mobile, watch portions, limit carbs, try the practice of leaving a bite, and phase in foods from the MIND Diet. Financial concerns -- As needed, follow through fact-finding and meeting with an advisor, in coordination with friend/POA Jamie Gamble.  Still OK to seek part-time work she can reach from her home if interested.  Maintain cordial working relationship with nephew/trustee Jamie Gamble. Other recommendations/advice -- As may be noted above.  Continue to utilize previously learned skills ad lib. Medication compliance -- Maintain medication as prescribed and work faithfully with relevant prescriber(s) if any changes are desired or seem  indicated. Crisis service -- Aware of call list and work-in appts.  Call the clinic on-call service, 988/hotline, 911, or present to [facility] or ER if any life-threatening psychiatric crisis. Followup -- Return for time as already scheduled, avail earlier @ PT's need.  Next scheduled visit with me 06/29/2023.  Next scheduled in this office 06/29/2023.  Robley Fries, PhD [name], PhD LP Clinical Psychologist, [facility] [address] [place], [place] [address] [phone]

## 2023-06-22 ENCOUNTER — Other Ambulatory Visit: Payer: Self-pay | Admitting: Internal Medicine

## 2023-06-29 ENCOUNTER — Ambulatory Visit: Admitting: Psychiatry

## 2023-06-29 DIAGNOSIS — F339 Major depressive disorder, recurrent, unspecified: Secondary | ICD-10-CM

## 2023-06-29 DIAGNOSIS — F5104 Psychophysiologic insomnia: Secondary | ICD-10-CM

## 2023-06-29 DIAGNOSIS — F411 Generalized anxiety disorder: Secondary | ICD-10-CM

## 2023-06-29 DIAGNOSIS — Z9189 Other specified personal risk factors, not elsewhere classified: Secondary | ICD-10-CM

## 2023-06-29 DIAGNOSIS — R69 Illness, unspecified: Secondary | ICD-10-CM

## 2023-06-29 DIAGNOSIS — F40232 Fear of other medical care: Secondary | ICD-10-CM | POA: Diagnosis not present

## 2023-06-29 NOTE — Progress Notes (Signed)
 Psychotherapy Progress Note [facility] Jamie Czar, PhD LP  Patient ID: Jamie Gamble Jamie Gamble)    MRN: [id number] Therapy format: Individual psychotherapy Date: 06/29/2023      Start: 11:08a     Stop: 11:35     Time Spent: 27 min + consultation time with psychiatry Location: Telehealth visit -- I connected with this patient by an approved telecommunication method (video), with her informed consent, and verifying identity and patient privacy.  I was located at my office and patient at her home.  As needed, we discussed the limitations, risks, and security and privacy concerns associated with telehealth service, including the availability and conditions which currently govern in-person appointments and the possibility that 3rd-party payment may not be fully guaranteed and she may be responsible for charges.  After she indicated understanding, we proceeded with the session.  Also discussed treatment planning, as needed, including ongoing verbal agreement with the plan, the opportunity to ask and answer all questions, her demonstrated understanding of instructions, and her readiness to call the office should symptoms worsen or she feels she is in a crisis state and needs more immediate and tangible assistance.   Session narrative (presenting needs, interim history, self-report of stressors and symptoms, applications of prior therapy, status changes, and interventions made in session) Reports feeling anxious, nervous, wants to keep it shorter today.  Didn't sleep well last night, says she always sleeps poorly before appointments of all kinds.  Later says she has difficulty falling asleep and broken sleep all the time anyway.  Reports she observes all the sleep hygiene measures discussed, and prays regularly, both in gratitude and petitions for help.  States not just DFA but broken up by NMs, which usually pertain to being overwhelmed somehow, not memories of actual trauma.  Given the similarity  to PTSD and theory of intrinsically hyperactive ANS, discussed the possibility of approaching psychiatry about an alpha blocker.  Behaviorally, recommend positioning pen and paper to write out anything on her mind at night, especially if it is intrusive thoughts and worries, just to externalize it, for one, and then either commend it to God for the night "watch", or at least make an authentic decision to externalize and "table" issues her mind wants to think about.  Important to break the "hypnosis" of worry.  Discussed creature comforts available to her.  Likes her robe, which she wears daily while in.  Probed possibility of a pet, but she is averse to the responsibility, would feel too much pressure.    Re. companionship, and obtaining some changes of scenery, she maintains weekly lunch out with Jamie Gamble.  Enjoys it well enough, but still driven by sensory hypersensitivity.  Sleep is no worse than usual before an Ann date.  Did talk with Jamie Gamble as requested about the possibility of coming over and doing something in, like Scrabble.  Affirmed for reaching out and asking, despite tepid response 2 wks ago.  Says Jamie Gamble is not keen on doing that, would rather go out for lunch.  Probed comprehension of the reasons and benefits, and turns out all Jamie Gamble could convey is that it would be more normalizing.  Refreshed the particular point that going out for socialization, activation, and change of scenery is just one side of it; bringing companionship and enjoyable experience into her home is also a powerful benefit as it helps break down stimulus control for depression and self-doubt at home.  Encouraged to let Jamie Gamble know this, too, as well as sensory  hypersensitivity, if not yet clear.    Still off Spravato, ECT, any other intensive therapies.  States she remains on supplements mentioned before, including L-theanine and magnesium QPM.  Shared with psychiatry in after-session messaging.  Therapeutic modalities: Cognitive  Behavioral Therapy, Solution-Oriented/Positive Psychology, Environmental manager, and Motivational Interviewing  Mental Status/Observations:  Appearance:   Casual     Behavior:  Resistant  Motor:  Normal  Speech/Language:   Clear and Coherent and softer  Affect:  Appropriate  Mood:  anxious and depressed  Thought process:  normal  Thought content:    Rumination  Sensory/Perceptual disturbances:    Alleges nausea  Orientation:  grossly intact  Attention:  Good    Concentration:  Good  Memory:  WNL  Insight:    Fair  Judgment:   Good  Impulse Control:  Good   Risk Assessment: Danger to Self: No Self-injurious Behavior: No Danger to Others: No Physical Aggression / Violence: No Duty to Warn: No Access to Firearms a concern: No  Assessment of progress:  stabilized, possibly deteriorating  Diagnosis:   ICD-10-CM   1. Recurrent major depression resistant to treatment (HCC)  F33.9     2. Generalized anxiety disorder -- severe, with sensory hypersensitivity  F41.1     3. Psychophysiological insomnia  F51.04    chronic    4. Fear of other medical care, particularly ECT  F40.232     5. At increased risk for social isolation  Z91.89     6. social anxiety -- r/o byproduct of depression vs. SAD vs. Avoidant Personality  R69      Plan:  Safety & welfare plan -- Continue pledge of no harm, see one or the other mental health provider at least 2/month until returning to normal activity outside the home.  [name] remains POA and cleared to confer in emergencies but not yet for planning interventions on her behalf.  Recommend all treatment team members stand willing to contact Jamie Gamble if any indications of compromised safety, or poor comprehension or judgment. Still priority advice to Continue supplements -- NAC regularly in the morning, Mg L-theonate regularly in the evening Make a daily practice of seeing some sunlight in the morning Try to get out for something, not just have it all  delivered Try to allow a neighbor to interact briefly, and say so if she'd like to get back home, trust it's not "rude" but honest Notice and dispute "I have to, but I can't" thinking wrestling with tasks -- either determine I don't have to after all, or I actually can do a little something Notice any difference these make, however small Prayer tone -- Asking God to "take away" depression is honest, but asking help seeing better things and raising stronger responses is more constructive Behavioral/social activation -- advise be in touch with Jamie Gamble, try to contact Toastmaster's friends, and entertain asking someone to come over or get out with them somehow Sleep regulation -- Should continue orange glasses QPM to facilitate circadian signaling, sleep readiness, and sleep hardiness.  Best recommendation dosing sleep-assisting meds up to 1 hr before bed to help her move through initial insomnia and insomnia anxiety.  Option add melatonin 2-5mg  if needed 1-2 hr ahead of bed.  Practice sleep hygiene, including comfortable arrangements, reduced lighting, and all caffeine stopped well ahead of bedtime (at least 6 hrs).  Use soothing imagery PRN for DFA.  Should adhere to CPAP and ensure proper maintenance.  For worry control, option to externalize worry thoughts by  writing them down and then consciously decide whether to stay up and think -- out of bed -- or "park" them and sleep, with further option to offer them to God's care overnight, consistent with her faith.  Acknowledge and table any worry about whether she will sleep -- trust the natural sleep drive to come around, and if too wakeful, get out of bed for anything not too stimulating.  Trust variations in sleep experience, e.g., an uptick in dreams, as natural progression of catching up deficits and processing emotional life issues.  Medication options may include alpha blocker to turn down the volume on autonomic arousal, signal it safer to  sleep. Physiological mood factors -- Start the day with good lighting, movement/stretching, and a few minutes grounding, outdoors if available/desired.  Vitamin levels are normal, but improved B12 and D could still be helpful.  Should get some moderate exercise as able, even just walking and stretching, to combat depressogenic inactivity effects. Self-esteem and intrusive guilt/shame thoughts -- PRN practices of blessing-counting and listing ways people have told/shown her she is lovable/capable.  No requirement to believe or agree, just consider.  Seek to notice and give self credit when she does anything constructive or principled for things that matter to her wellbeing or sense of purpose and for any efforts that are better than full avoidance.  As needed, self-affirm God would not be punishing her, it's a trick of depression to think it. Activities and socialization -- Continue to strongly recommend at least minimal outings to break rumination and stimulate her emotional and behavioral systems.  As able, develop activities of interest.  Self-monitor for broad demotivation and reach out.  Known options in friend Rosalio Loud, Field seismologist, and church activities at 3 locations, ranging from chair yoga to entertainment/fellowship group to more theological and philosophical study.  Maintain/restore system of friends beyond [place], which may include anyone available at housing community.  Dispute worry thoughts of being judged, and enter interpersonal space with permission to feel not so up to it, either.  [employer] can be available for supportive calls, assistance services, and possible facility-based recreation.  Home care companions when needed.  Self-monitor for impulse to more fully isolate, and in all involvements practice permission to be authentic about her tolerance for spending time in view or the presence of others.  OK with employment if regains interest. Placement concerns -- Independent living  with PRN assistance should be fine for the foreseeable future, though she could use the interpersonal stimulus of more regular contact inside or outside the confines of her home.  Available on request and consent to interpret her condition empowered persons among her family and friends. Specialized therapies and medical procedure anxiety -- ECT and Spravato now on hold.  Despite cognitive recovery after ECT, the ideas of shock, anesthesia, and potentially hallucinogenic tranquilizer are all highly anxiety-provoking to her.  Pt understands and accepts the burden to notify treatment team of relapse in dark/dreadful mood, severe demotivation, or intractable insomnia.  Ask all necessary questions of health care, make sure to adequately comprehend recommendations rather than catastrophize without knowing.  For any procedure accepted, imagine the competence and caring of health care personnel in action, especially if it involves anesthesia and other vulnerable-making procedures. Weight management -- Concur with modified Weight Watchers approach and improved food choices at her interest.  Encourage to stay mobile, watch portions, limit carbs, try the practice of leaving a bite, and phase in foods from the MIND Diet. Financial concerns --  As needed, follow through fact-finding and meeting with an advisor, in coordination with friend/POA Jamie Gamble.  Still OK to seek part-time work she can reach from her home if interested.  Maintain cordial working relationship with nephew/trustee Fraser Din. Other recommendations/advice -- As may be noted above.  Continue to utilize previously learned skills ad lib. Medication compliance -- Maintain medication as prescribed and work faithfully with relevant prescriber(s) if any changes are desired or seem indicated. Crisis service -- Aware of call list and work-in appts.  Call the clinic on-call service, 988/hotline, 911, or present to [facility] or ER if any life-threatening psychiatric  crisis. Followup -- Return for time as already scheduled.  Next scheduled visit with me 07/13/2023.  Next scheduled in this office 06/30/2023.  Robley Fries, PhD Jamie Czar, PhD LP Clinical Psychologist, [facility] [address] [place], [place] [address] [phone]

## 2023-06-30 ENCOUNTER — Encounter: Payer: Self-pay | Admitting: Adult Health

## 2023-06-30 ENCOUNTER — Telehealth: Admitting: Adult Health

## 2023-06-30 DIAGNOSIS — F4310 Post-traumatic stress disorder, unspecified: Secondary | ICD-10-CM | POA: Diagnosis not present

## 2023-06-30 DIAGNOSIS — F515 Nightmare disorder: Secondary | ICD-10-CM

## 2023-06-30 DIAGNOSIS — F411 Generalized anxiety disorder: Secondary | ICD-10-CM | POA: Diagnosis not present

## 2023-06-30 DIAGNOSIS — G4700 Insomnia, unspecified: Secondary | ICD-10-CM | POA: Diagnosis not present

## 2023-06-30 DIAGNOSIS — F339 Major depressive disorder, recurrent, unspecified: Secondary | ICD-10-CM | POA: Diagnosis not present

## 2023-06-30 MED ORDER — PRAZOSIN HCL 2 MG PO CAPS: 2.0000 mg | ORAL_CAPSULE | Freq: Every day | ORAL | 2 refills | Status: DC

## 2023-06-30 NOTE — Progress Notes (Signed)
 Jamie Gamble [id number] [birth year] 78 y.o.  Virtual Visit via Video Note  I connected with pt @ on 06/30/23 at 11:30 AM EDT by a video enabled telemedicine application and verified that I am speaking with the correct person using two identifiers.   I discussed the limitations of evaluation and management by telemedicine and the availability of in person appointments. The patient expressed understanding and agreed to proceed.  I discussed the assessment and treatment plan with the patient. The patient was provided an opportunity to ask questions and all were answered. The patient agreed with the plan and demonstrated an understanding of the instructions.   The patient was advised to call back or seek an in-person evaluation if the symptoms worsen or if the condition fails to improve as anticipated.  I provided 25 minutes of non-face-to-face time during this encounter.  The patient was located at home.  The provider was located at [facility].   Dorothyann Gibbs, NP   Subjective:   Patient ID:  Jamie Gamble is a 78 y.o. (DOB Aug 13, 1945) female.  Chief Complaint: No chief complaint on file.   HPI JAYLE SOLARZ presents for follow-up of GAD, insomnia and MDD.  Seeing therapist - Dr. Farrel Demark   Describes mood today as "no different". Pleasant. Flat. Denies tearfulness. Mood symptoms - reports depression and anxiety. Reports decreased interest and motivation. Denies irritability. Denies panic attacks. Reports worry, rumination, and over thinking - "just my health".  Mood is lower. Stating "I don't feel like I'm making any progress". Reports taking medications as prescribed - willing to consider other options. Energy levels lower. Active, does not have a regular exercise routine.  Enjoys some usual interests and activities. Single. Lives alone. No family local.  Appetite decreased. Weight gain - 174 pounds. Reports sleep as inconsistent. Reports constantly waking up throughout the  night. Reports going to be at 9p and reports taking a "while" and is back up an hour and then reports broken sleep throughout the rest of the night. Report using CPAP machine nightly. Denies daytime napping. Reports focus and concentration difficulties. Completing minimal tasks. Managing minimal aspects of household. Retired. Denies SI or HI.   Denies AH or VH. Denies self harm. Denies substance use.   Previous ECT treatment.   Review of Systems:  Review of Systems  Constitutional: Negative.   HENT: Negative.    Eyes: Negative.   Respiratory: Negative.    Cardiovascular: Negative.   Gastrointestinal: Negative.   Endocrine: Negative.   Genitourinary: Negative.   Musculoskeletal: Negative.  Negative for gait problem.  Skin: Negative.   Allergic/Immunologic: Negative.   Neurological: Negative.  Negative for tremors.  Hematological: Negative.   Psychiatric/Behavioral:         Please refer to HPI    Medications: I have reviewed the patient's current medications.  Current Outpatient Medications  Medication Sig Dispense Refill   atorvastatin (LIPITOR) 40 MG tablet Take 1 tablet (40 mg total) by mouth at bedtime. 90 tablet 1   Esketamine HCl, 84 MG Dose, (SPRAVATO, 84 MG DOSE,) 28 MG/DEVICE SOPK Place 84 mg into the nose every 7 (seven) days. 3 each 4   FLUoxetine (PROZAC) 40 MG capsule Take 1 capsule (40 mg total) by mouth daily. 90 capsule 0   Glycerin-Hypromellose-PEG 400 (VISINE DRY EYE OP) Place 1 drop into both eyes daily as needed (dry eyes).     hydrochlorothiazide (MICROZIDE) 12.5 MG capsule Take 1 capsule (12.5 mg total) by mouth daily. 90 capsule  1   hydrOXYzine (ATARAX) 25 MG tablet Take 1 tablet (25 mg total) by mouth at bedtime as needed. 90 tablet 1   losartan (COZAAR) 25 MG tablet Take 1 tablet (25 mg total) by mouth daily. 90 tablet 1   pantoprazole (PROTONIX) 40 MG tablet Take 1 tablet (40 mg total) by mouth daily. 90 tablet 1   Probiotic Product (PROBIOTIC DAILY  PO) Take 1 capsule by mouth daily.     QUEtiapine (SEROQUEL) 25 MG tablet Take 1 tablet (25 mg total) by mouth at bedtime. 90 tablet 1   traZODone (DESYREL) 100 MG tablet Take two tablets at bedtime. 180 tablet 1   No current facility-administered medications for this visit.    Medication Side Effects: None  Allergies:  Allergies  Allergen Reactions   Esomeprazole Diarrhea and Other (See Comments)    Past Medical History:  Diagnosis Date   Depression    GERD (gastroesophageal reflux disease)    Hyperlipidemia    Hypertension     Family History  Problem Relation Age of Onset   Depression Mother    Cancer Father    Depression Brother     Social History   Socioeconomic History   Marital status: Single    Spouse name: Not on file   Number of children: 0   Years of education: Not on file   Highest education level: Doctorate  Occupational History   Occupation: Retired [employer] Employee  Tobacco Use   Smoking status: Former   Smokeless tobacco: Never  Advertising account planner   Vaping status: Never Used  Substance and Sexual Activity   Alcohol use: Not Currently   Drug use: Never   Sexual activity: Not on file  Other Topics Concern   Not on file  Social History Narrative   Not on file   Social Drivers of Health   Financial Resource Strain: Low Risk  (05/06/2023)   Overall Financial Resource Strain (CARDIA)    Difficulty of Paying Living Expenses: Not very hard  Food Insecurity: No Food Insecurity (05/06/2023)   Hunger Vital Sign    Worried About Running Out of Food in the Last Year: Never true    Ran Out of Food in the Last Year: Never true  Transportation Needs: Patient Declined (05/06/2023)   PRAPARE - Transportation    Lack of Transportation (Medical): Patient declined    Lack of Transportation (Non-Medical): Patient declined  Physical Activity: Unknown (05/06/2023)   Exercise Vital Sign    Days of Exercise per Week: 0 days    Minutes of Exercise per Session: Not on file   Stress: Stress Concern Present (05/06/2023)   [employer] of Occupational Health - Occupational Stress Questionnaire    Feeling of Stress : To some extent  Social Connections: Unknown (05/06/2023)   Social Connection and Isolation Panel [NHANES]    Frequency of Communication with Friends and Family: Twice a week    Frequency of Social Gatherings with Friends and Family: Once a week    Attends Religious Services: Patient declined    Database administrator or Organizations: Yes    Attends Engineer, structural: More than 4 times per year    Marital Status: Never married  Intimate Partner Violence: Unknown (06/28/2021)   Received from [employer], [facility]   HITS    Physically Hurt: Not on file    Insult or Talk Down To: Not on file    Threaten Physical Harm: Not on file  Scream or Curse: Not on file    Past Medical History, Surgical history, Social history, and Family history were reviewed and updated as appropriate.   Please see review of systems for further details on the patient's review from today.   Objective:   Physical Exam:  There were no vitals taken for this visit.  Physical Exam Constitutional:      General: She is not in acute distress. Musculoskeletal:        General: No deformity.  Neurological:     Mental Status: She is alert and oriented to person, place, and time.     Coordination: Coordination normal.  Psychiatric:        Attention and Perception: Attention and perception normal. She does not perceive auditory or visual hallucinations.        Mood and Affect: Mood is anxious and depressed. Affect is not labile, blunt, angry or inappropriate.        Speech: Speech normal.        Behavior: Behavior normal.        Thought Content: Thought content normal. Thought content is not paranoid or delusional. Thought content does not include homicidal or suicidal ideation. Thought content does not include homicidal or suicidal plan.        Cognition  and Memory: Cognition and memory normal.        Judgment: Judgment normal.     Comments: Insight intact     Lab Review:     Component Value Date/Time   NA 140 05/12/2023 1619   K 3.8 05/12/2023 1619   CL 102 05/12/2023 1619   CO2 29 05/12/2023 1619   GLUCOSE 102 (H) 05/12/2023 1619   BUN 17 05/12/2023 1619   BUN 15 07/06/2019 0000   CREATININE 0.79 05/12/2023 1619   CALCIUM 9.6 05/12/2023 1619   PROT 6.1 05/12/2023 1619   ALBUMIN 4.0 08/13/2021 0905   AST 21 05/12/2023 1619   ALT 14 05/12/2023 1619   ALKPHOS 48 08/13/2021 0905   BILITOT 0.2 05/12/2023 1619   GFRNONAA >60 06/04/2021 1154       Component Value Date/Time   WBC 7.0 05/12/2023 1619   RBC 4.34 05/12/2023 1619   HGB 13.9 05/12/2023 1619   HCT 40.6 05/12/2023 1619   PLT 284 05/12/2023 1619   MCV 93.5 05/12/2023 1619   MCH 32.0 05/12/2023 1619   MCHC 34.2 05/12/2023 1619   RDW 12.3 05/12/2023 1619   LYMPHSABS 0.7 08/13/2021 0905   MONOABS 0.3 08/13/2021 0905   EOSABS 315 05/12/2023 1619   BASOSABS 49 05/12/2023 1619    No results found for: "POCLITH", "LITHIUM"   No results found for: "PHENYTOIN", "PHENOBARB", "VALPROATE", "CBMZ"   .res Assessment: Plan:    Plan:  Seeing [name] for therapy   PDMP reviewed  Continue:  Add Prozosin 2mg  at bedtime for PTSD/nightmares  Prozac 40mg  daily Trazadone 100mg  - take one tablet at bedtime for sleep  D/C Seroquel 25mg  at hs  D/C Hydroxyzine 25mg  - 1 at hs for sleep    Melatonin 5mg  at hs  Followed by [name] - therapy   RTC 4 weeks   25 minutes spent dedicated to the care of this patient on the date of this encounter to include pre-visit review of records, ordering of medication, post visit documentation, and face-to-face time with the patient discussing MDD, anxiety and insomnia. Discussed continuing current medication regimen.  Patient advised to contact office with any questions, adverse effects, or acute worsening in  signs and  symptoms.  Discussed potential metabolic side effects associated with atypical antipsychotics, as well as potential risk for movement side effects. Advised pt to contact office if movement side effects occur.   There are no diagnoses linked to this encounter.   Please see After Visit Summary for patient specific instructions.  Future Appointments  Date Time Provider Department Center  07/13/2023 11:00 AM Robley Fries, PhD CP-[facility] None  08/03/2023  1:00 PM Robley Fries, PhD [facility] None  08/18/2023 11:00 AM Robley Fries, PhD CP[facility] None  09/01/2023 11:00 AM Robley Fries, PhD CP-[facility] None  09/16/2023 11:00 AM Robley Fries, PhD CP[facility] None    No orders of the defined types were placed in this encounter.     -------------------------------

## 2023-07-13 ENCOUNTER — Ambulatory Visit (INDEPENDENT_AMBULATORY_CARE_PROVIDER_SITE_OTHER): Payer: Medicare PPO | Admitting: Psychiatry

## 2023-07-13 DIAGNOSIS — F5104 Psychophysiologic insomnia: Secondary | ICD-10-CM

## 2023-07-13 DIAGNOSIS — R69 Illness, unspecified: Secondary | ICD-10-CM | POA: Diagnosis not present

## 2023-07-13 DIAGNOSIS — F339 Major depressive disorder, recurrent, unspecified: Secondary | ICD-10-CM

## 2023-07-13 DIAGNOSIS — F40232 Fear of other medical care: Secondary | ICD-10-CM

## 2023-07-13 DIAGNOSIS — F411 Generalized anxiety disorder: Secondary | ICD-10-CM | POA: Diagnosis not present

## 2023-07-13 NOTE — Progress Notes (Signed)
 Psychotherapy Progress Note [facility] Delora Ferry, PhD LP  Patient ID: Jamie Gamble [facility])    MRN: [id number] Therapy format: Individual psychotherapy Date: 07/13/2023      Start: 11:08a     Stop: 11:48a     Time Spent: 40 min Location: Telehealth visit -- I connected with this patient by an approved telecommunication method (video), with her informed consent, and verifying identity and patient privacy.  I was located at my office and patient at her home.  As needed, we discussed the limitations, risks, and security and privacy concerns associated with telehealth service, including the availability and conditions which currently govern in-person appointments and the possibility that 3rd-party payment may not be fully guaranteed and she may be responsible for charges.  After she indicated understanding, we proceeded with the session.  Also discussed treatment planning, as needed, including ongoing verbal agreement with the plan, the opportunity to ask and answer all questions, her demonstrated understanding of instructions, and her readiness to call the office should symptoms worsen or she feels she is in a crisis state and needs more immediate and tangible assistance.   Session narrative (presenting needs, interim history, self-report of stressors and symptoms, applications of prior therapy, status changes, and interventions made in session) Seen by psychiatry day after last meeting, d/c'd Seroquel  and hydroxyzine  in favor of 2mg  prazosin  and continue 5mg  melatonin.  Today, says medication's no help, still waking up a lot, maybe worse.  Reports "same" for anxiety and depression (and tired) now.  Is taking melatonin and prazosin  both about an hour before bed, still claims difficulty both with initial insomnia and frequent waking.  Reminded that melatonin is not going to be a strong agent, certainly not strong enough to "make' her sleep with the kind of worry and obsessing she does, and  prazosin 's role is to turn down the volume on panic and nightmares, though it typically has to get to a higher strength to accomplish that.  Acknowledged having NMs and worry about sleep itself, reinforcing the impression that she is so deeply conditioned to worry about herself that it will tend to thwart many interventions.  Declines any interests for what she might do with wakeful time either at night or by day.    Socially, continues about 1/wk with Lorelee Roger.  Did not bring up as recommended the logic of them spending some of their time in her home -- is more or less compelled to do what she thinks Lorelee Roger wants, and last understanding is that Lorelee Roger really wants to get out for lunch.  Suspect it is not as discussed as it is claimed to be, and reminded Pt the reasoning is that having some positive time with company in her own home environment will help change the effects of her home environment when alone, rehabilitating her space as a more positive space, with pleasant memories attached.  Renewed the idea of playing a game she likes, such as Scrabble.  Says she got texts from a couple of friends, Eddie Good (childhood friend in [place] , battling cancer) and Kaaren Ora ([employer], local), who has pitched idea of going out to lunch (declined so far).  Probed how motivating these are to her and whether she'd like to adjust or develop plans.  Began to weary of the conversation but about 20 minutes in.  Affirmed making it 40 and allowed to retreat until next contact. With encouragement as noted.  Psychiatry consulted following session for coordination of care.  Therapeutic modalities: Cognitive  Behavioral Therapy, Solution-Oriented/Positive Psychology, Environmental manager, and Motivational Interviewing  Mental Status/Observations:  Appearance:   Casual, pale     Behavior:  Resistant, appropriate  Motor:  Normal  Speech/Language:   Clear and Coherent  Affect:  Appropriate  Mood:  anxious and depressed  Thought process:   normal  Thought content:    Rumination  Sensory/Perceptual disturbances:    WNL  Orientation:  Fully oriented  Attention:  Good    Concentration:  Good  Memory:  WNL  Insight:    Fair  Judgment:   Fair  Impulse Control:  Good   Risk Assessment: Danger to Self: No Self-injurious Behavior: No Danger to Others: No Physical Aggression / Violence: No Duty to Warn: No Access to Firearms a concern: No  Assessment of progress:  stabilized  Diagnosis:   ICD-10-CM   1. Recurrent major depression resistant to treatment (HCC)  F33.9     2. Generalized anxiety disorder -- severe, with sensory hypersensitivity  F41.1     3. Psychophysiological insomnia  F51.04     4. Fear of other medical care, particularly ECT  F40.232     5. social anxiety -- r/o byproduct of depression vs. SAD vs. Avoidant Personality  R69      Plan:  Safety & welfare plan -- Continue pledge of no harm, see one or the other mental health provider at least 2/month until returning to normal activity outside the home.  Melisa Spray remains POA and cleared to confer in emergencies but not yet for planning interventions on her behalf.  Recommend all treatment team members stand willing to contact Lorelee Roger if any indications of compromised safety, or poor comprehension or judgment. Still priority advice to Request some socializing with Lorelee Roger be done inside, for the sake of changing how her home feels Continue supplements -- NAC regularly in the morning, Mg L-theonate regularly in the evening Make a daily practice of seeing some sunlight in the morning Try to get out for something, not just have it all delivered Try to allow a neighbor to interact briefly, and say so if she'd like to get back home, trust it's not "rude" but honest Notice and dispute "I have to, but I can't" thinking while wrestling with tasks -- either determine I don't have to after all, or I actually can do a little something Prayer tone -- Asking God to "take away"  depression is honest, but asking help seeing better things and raising stronger responses is more constructive Notice any difference these efforts make, however small Behavioral/social activation -- advise be in touch with Lorelee Roger, try to contact [employer] friends, and entertain asking someone else to come over or get out with them somehow; vary spending social time out in the world and within her home Sleep regulation -- Should continue orange glasses QPM to facilitate circadian signaling, sleep readiness, and sleep hardiness.  Best recommendation dosing sleep-assisting meds up to 1 hr before bed to help her move through initial insomnia and insomnia anxiety.  Option add melatonin 2-5mg  if needed 1-2 hr ahead of bed.  Practice sleep hygiene, including comfortable arrangements, reduced lighting, and all caffeine stopped well ahead of bedtime (at least 6 hrs).  Use soothing imagery PRN for DFA.  Should adhere to CPAP and ensure proper maintenance.  For worry control, option to externalize worry thoughts by writing them down and then consciously decide whether to stay up and think -- out of bed -- or "park" them and sleep, with further option to  offer them to God's care overnight, consistent with her faith.  Acknowledge and table any worry about whether she will sleep -- trust the natural sleep drive to come around, and if too wakeful, get out of bed for anything not too stimulating.  Trust variations in sleep experience, e.g., an uptick in dreams, as natural progression of catching up deficits and processing emotional life issues.  Medication options may include alpha blocker to turn down the volume on autonomic arousal, signal it safer to sleep. Physiological mood factors -- Start the day with good lighting, movement/stretching, and a few minutes grounding, outdoors if available/desired.  Vitamin levels are normal, but improved B12 and D could still be helpful.  Should get some moderate exercise as able, even  just walking and stretching, to combat depressogenic inactivity effects. Self-esteem and intrusive guilt/shame thoughts -- PRN practices of blessing-counting and listing ways people have told/shown her she is lovable/capable.  No requirement to believe or agree, just consider.  Seek to notice and give self credit when she does anything constructive or principled for things that matter to her wellbeing or sense of purpose and for any efforts that are better than full avoidance.  As needed, self-affirm God would not be punishing her, it's a trick of depression to think it. Activities and socialization -- Continue to strongly recommend at least minimal outings to break rumination and stimulate her emotional and behavioral systems.  As able, develop activities of interest.  Self-monitor for broad demotivation and reach out.  Known options in friend Arland Bellis, [place], and church activities at 3 locations, ranging from chair yoga to entertainment/fellowship group to more theological and philosophical study.  Maintain/restore system of friends beyond [place], which may include anyone available at housing community.  Dispute worry thoughts of being judged, and enter interpersonal space with permission to feel not so up to it, either.  [employer] can be available for supportive calls, assistance services, and possible facility-based recreation.  Home care companions when needed.  Self-monitor for impulse to more fully isolate, and in all involvements practice permission to be authentic about her tolerance for spending time in view or the presence of others.  OK with employment if regains interest. Placement concerns -- Independent living with PRN assistance should be fine for the foreseeable future, though she could use the interpersonal stimulus of more regular contact inside or outside the confines of her home.  Available on request and consent to interpret her condition empowered persons among her family  and friends. Specialized therapies and medical procedure anxiety -- ECT and Spravato  now on hold.  Despite cognitive recovery after ECT, the ideas of shock, anesthesia, and potentially hallucinogenic tranquilizer are all highly anxiety-provoking to her.  Pt understands and accepts the burden to notify treatment team of relapse in dark/dreadful mood, severe demotivation, or intractable insomnia.  Ask all necessary questions of health care, make sure to adequately comprehend recommendations rather than catastrophize without knowing.  For any procedure accepted, imagine the competence and caring of health care personnel in action, especially if it involves anesthesia and other vulnerable-making procedures. Weight management -- Concur with modified Weight Watchers approach and improved food choices at her interest.  Encourage to stay mobile, watch portions, limit carbs, try the practice of leaving a bite, and phase in foods from the MIND Diet. Financial concerns -- As needed, follow through fact-finding and meeting with an advisor, in coordination with friend/POA Lorelee Roger.  Still OK to seek part-time work she can reach from her  home if interested.  Maintain cordial working relationship with nephew/trustee Norvell Beers. Other recommendations/advice -- As may be noted above.  Continue to utilize previously learned skills ad lib. Medication compliance -- Maintain medication as prescribed and work faithfully with relevant prescriber(s) if any changes are desired or seem indicated. Crisis service -- Aware of call list and work-in appts.  Call the clinic on-call service, 988/hotline, 911, or present to [facility] or ER if any life-threatening psychiatric crisis. Followup -- No follow-ups on file.  Next scheduled visit with me 08/03/2023.  Next scheduled in this office 07/28/2023.  Maretta Shaper, PhD Delora Ferry, PhD LP Clinical Psychologist, [facility] [address],  [address] [place], [place] [address] [phone]

## 2023-07-14 ENCOUNTER — Telehealth: Payer: Self-pay | Admitting: Adult Health

## 2023-07-14 NOTE — Telephone Encounter (Signed)
 Pt seen 4/8.   Add Prozosin 2mg  at bedtime for PTSD/nightmares   Prozac  40mg  daily Trazadone 100mg  - take one tablet at bedtime for sleep   D/C Seroquel  25mg  at hs  D/C Hydroxyzine  25mg  - 1 at hs for sleep     Melatonin 5mg  at hs  She said Jamie Gamble feels like she needs to increase the Prazosin . She doesn't mention NM, but said she isn't sleeping, has trouble getting to sleep and staying asleep. She can't say how many hours of sleep she is getting. No new stressors. Affect very flat and gruff.

## 2023-07-14 NOTE — Telephone Encounter (Signed)
 Pt called and said that andy told her that he feels her new medication needs to be increased.  The prazosin 

## 2023-07-14 NOTE — Telephone Encounter (Signed)
 Patient told to double prazosin  dose to 4 mg and let us  know how she was doing next week. Will send in new Rx if needed.

## 2023-07-20 MED ORDER — QUETIAPINE FUMARATE 25 MG PO TABS: 25.0000 mg | ORAL_TABLET | Freq: Every day | ORAL | 1 refills | Status: DC

## 2023-07-20 NOTE — Telephone Encounter (Signed)
 Patient notified to restart Seroquel  only. Sent in Rx to [facility].

## 2023-07-20 NOTE — Telephone Encounter (Signed)
 Patient doubled Prazosin  to 4 mg and reporting it has made NM worse. She is asking if she can go back to the Seroquel  and hydroxyzine .

## 2023-07-28 ENCOUNTER — Encounter: Payer: Self-pay | Admitting: Adult Health

## 2023-07-28 ENCOUNTER — Telehealth (INDEPENDENT_AMBULATORY_CARE_PROVIDER_SITE_OTHER): Admitting: Adult Health

## 2023-07-28 DIAGNOSIS — F411 Generalized anxiety disorder: Secondary | ICD-10-CM | POA: Diagnosis not present

## 2023-07-28 DIAGNOSIS — F339 Major depressive disorder, recurrent, unspecified: Secondary | ICD-10-CM

## 2023-07-28 DIAGNOSIS — F515 Nightmare disorder: Secondary | ICD-10-CM

## 2023-07-28 DIAGNOSIS — G4700 Insomnia, unspecified: Secondary | ICD-10-CM | POA: Diagnosis not present

## 2023-07-28 MED ORDER — ARIPIPRAZOLE 5 MG PO TABS: ORAL_TABLET | ORAL | 2 refills | Status: DC

## 2023-07-28 MED ORDER — QUETIAPINE FUMARATE 25 MG PO TABS: ORAL_TABLET | ORAL | 2 refills | Status: DC

## 2023-07-28 NOTE — Progress Notes (Signed)
 Jamie Gamble [id number] Mar 13, 1946 78 y.o.  Virtual Visit via Video Note  I connected with pt @ on 07/28/23 at  1:30 PM EDT by a video enabled telemedicine application and verified that I am speaking with the correct person using two identifiers.   I discussed the limitations of evaluation and management by telemedicine and the availability of in person appointments. The patient expressed understanding and agreed to proceed.  I discussed the assessment and treatment plan with the patient. The patient was provided an opportunity to ask questions and all were answered. The patient agreed with the plan and demonstrated an understanding of the instructions.   The patient was advised to call back or seek an in-person evaluation if the symptoms worsen or if the condition fails to improve as anticipated.  I provided 25 minutes of non-face-to-face time during this encounter.  The patient was located at home.  The provider was located at [facility].   Reagan Camera, NP   Subjective:   Patient ID:  Jamie Gamble is a 78 y.o. (DOB 23-Jun-1945) female.  Chief Complaint: No chief complaint on file.   HPI CHARITEE BROWNE presents for follow-up of GAD, insomnia and MDD.  Seeing therapist - Dr. Caroleen Churn   Describes mood today as "about the same". Pleasant. Flat. Denies tearfulness. Mood symptoms - reports depression and anxiety - more depressed overall. Reports decreased interest and motivation. Denies irritability. Denies panic attacks. Reports worry, rumination and over thinking - "just about my health". Reports mood as lower. Stating "I don't feel like I'm doing very good". Reports taking medications as prescribed, but does not feel like they are helpful. Energy levels lower. Active, does not have a regular exercise routine. Reports walking to the mailbox once a week. Unable to enjoy some usual interests and activities. Single. Lives alone. No family local.  Appetite decreased. Weight gain  - 174 pounds. Reports sleeping has improved since restarting Seroquel  at 25mg . Averages 5 hours, but still feels tired. Report using CPAP machine nightly. Denies daytime napping. Reports focus and concentration difficulties - "not so great - can't get interested in anything". Reports watching TV most of the day and into the evening. Managing minimal aspects of household - some cleaning. Retired. Denies SI or HI.   Denies AH or VH. Denies self harm. Denies substance use.   Previous ECT treatment Previous Spravato  treatmet  Previous medication trials: Trazodone , Hydroxyzine , Zoloft, Remeron, Seroquel , Vraylar , Abilify   Review of Systems:  Review of Systems  Musculoskeletal:  Negative for gait problem.  Neurological:  Negative for tremors.  Psychiatric/Behavioral:         Please refer to HPI    Medications: I have reviewed the patient's current medications.  Current Outpatient Medications  Medication Sig Dispense Refill   QUEtiapine  (SEROQUEL ) 25 MG tablet Take 1 tablet (25 mg total) by mouth at bedtime. 30 tablet 1   atorvastatin  (LIPITOR) 40 MG tablet Take 1 tablet (40 mg total) by mouth at bedtime. 90 tablet 1   FLUoxetine  (PROZAC ) 40 MG capsule Take 1 capsule (40 mg total) by mouth daily. 90 capsule 0   Glycerin-Hypromellose-PEG 400 (VISINE DRY EYE OP) Place 1 drop into both eyes daily as needed (dry eyes).     hydrochlorothiazide  (MICROZIDE ) 12.5 MG capsule Take 1 capsule (12.5 mg total) by mouth daily. 90 capsule 1   losartan  (COZAAR ) 25 MG tablet Take 1 tablet (25 mg total) by mouth daily. 90 tablet 1   pantoprazole  (PROTONIX ) 40 MG  tablet Take 1 tablet (40 mg total) by mouth daily. 90 tablet 1   Probiotic Product (PROBIOTIC DAILY PO) Take 1 capsule by mouth daily.     traZODone  (DESYREL ) 100 MG tablet Take two tablets at bedtime. 180 tablet 1   No current facility-administered medications for this visit.    Medication Side Effects: None  Allergies:  Allergies  Allergen  Reactions   Esomeprazole Diarrhea and Other (See Comments)    Past Medical History:  Diagnosis Date   Depression    GERD (gastroesophageal reflux disease)    Hyperlipidemia    Hypertension     Family History  Problem Relation Age of Onset   Depression Mother    Cancer Father    Depression Brother     Social History   Socioeconomic History   Marital status: Single    Spouse name: Not on file   Number of children: 0   Years of education: Not on file   Highest education level: Doctorate  Occupational History   Occupation: Retired [employer] Employee  Tobacco Use   Smoking status: Former   Smokeless tobacco: Never  Advertising account planner   Vaping status: Never Used  Substance and Sexual Activity   Alcohol use: Not Currently   Drug use: Never   Sexual activity: Not on file  Other Topics Concern   Not on file  Social History Narrative   Not on file   Social Drivers of Health   Financial Resource Strain: Low Risk  (05/06/2023)   Overall Financial Resource Strain (CARDIA)    Difficulty of Paying Living Expenses: Not very hard  Food Insecurity: No Food Insecurity (05/06/2023)   Hunger Vital Sign    Worried About Running Out of Food in the Last Year: Never true    Ran Out of Food in the Last Year: Never true  Transportation Needs: Patient Declined (05/06/2023)   PRAPARE - Transportation    Lack of Transportation (Medical): Patient declined    Lack of Transportation (Non-Medical): Patient declined  Physical Activity: Unknown (05/06/2023)   Exercise Vital Sign    Days of Exercise per Week: 0 days    Minutes of Exercise per Session: Not on file  Stress: Stress Concern Present (05/06/2023)   [employer] of Occupational Health - Occupational Stress Questionnaire    Feeling of Stress : To some extent  Social Connections: Unknown (05/06/2023)   Social Connection and Isolation Panel [NHANES]    Frequency of Communication with Friends and Family: Twice a week    Frequency of Social  Gatherings with Friends and Family: Once a week    Attends Religious Services: Patient declined    Database administrator or Organizations: Yes    Attends Engineer, structural: More than 4 times per year    Marital Status: Never married  Intimate Partner Violence: Unknown (06/28/2021)   Received from [employer], [facility]   HITS    Physically Hurt: Not on file    Insult or Talk Down To: Not on file    Threaten Physical Harm: Not on file    Scream or Curse: Not on file    Past Medical History, Surgical history, Social history, and Family history were reviewed and updated as appropriate.   Please see review of systems for further details on the patient's review from today.   Objective:   Physical Exam:  There were no vitals taken for this visit.  Physical Exam Constitutional:      General: She  is not in acute distress. Musculoskeletal:        General: No deformity.  Neurological:     Mental Status: She is alert and oriented to person, place, and time.     Coordination: Coordination normal.  Psychiatric:        Attention and Perception: Attention and perception normal. She does not perceive auditory or visual hallucinations.        Mood and Affect: Mood normal. Affect is not labile, blunt, angry or inappropriate.        Speech: Speech normal.        Behavior: Behavior normal.        Thought Content: Thought content normal. Thought content is not paranoid or delusional. Thought content does not include homicidal or suicidal ideation. Thought content does not include homicidal or suicidal plan.        Cognition and Memory: Cognition and memory normal.        Judgment: Judgment normal.     Comments: Insight intact     Lab Review:     Component Value Date/Time   NA 140 05/12/2023 1619   K 3.8 05/12/2023 1619   CL 102 05/12/2023 1619   CO2 29 05/12/2023 1619   GLUCOSE 102 (H) 05/12/2023 1619   BUN 17 05/12/2023 1619   BUN 15 07/06/2019 0000   CREATININE 0.79  05/12/2023 1619   CALCIUM  9.6 05/12/2023 1619   PROT 6.1 05/12/2023 1619   ALBUMIN 4.0 08/13/2021 0905   AST 21 05/12/2023 1619   ALT 14 05/12/2023 1619   ALKPHOS 48 08/13/2021 0905   BILITOT 0.2 05/12/2023 1619   GFRNONAA >60 06/04/2021 1154       Component Value Date/Time   WBC 7.0 05/12/2023 1619   RBC 4.34 05/12/2023 1619   HGB 13.9 05/12/2023 1619   HCT 40.6 05/12/2023 1619   PLT 284 05/12/2023 1619   MCV 93.5 05/12/2023 1619   MCH 32.0 05/12/2023 1619   MCHC 34.2 05/12/2023 1619   RDW 12.3 05/12/2023 1619   LYMPHSABS 0.7 08/13/2021 0905   MONOABS 0.3 08/13/2021 0905   EOSABS 315 05/12/2023 1619   BASOSABS 49 05/12/2023 1619    No results found for: "POCLITH", "LITHIUM"   No results found for: "PHENYTOIN", "PHENOBARB", "VALPROATE", "CBMZ"   .res Assessment: Plan:    Plan:  Seeing Delora Ferry for therapy   PDMP reviewed  Continue:  Add Abilify  5mg  daily - start with 1/2 tablet daily x 7 days.   Prozac  40mg  daily Trazadone 100mg  - take one to two tablets at bedtime for sleep Seroquel  25mg  - 1 to 2 at bedtime     Melatonin 5mg  at hs  Followed by Delora Ferry - therapy   RTC 4 weeks   25 minutes spent dedicated to the care of this patient on the date of this encounter to include pre-visit review of records, ordering of medication, post visit documentation, and face-to-face time with the patient discussing MDD, anxiety and insomnia. Discussed continuing current medication regimen.  Patient advised to contact office with any questions, adverse effects, or acute worsening in signs and symptoms.  Discussed potential metabolic side effects associated with atypical antipsychotics, as well as potential risk for movement side effects. Advised pt to contact office if movement side effects occur.   There are no diagnoses linked to this encounter.   Please see After Visit Summary for patient specific instructions.  Future Appointments  Date Time Provider  Department Center  07/28/2023  1:30 PM Jaydy Fitzhenry,  Ursula Gardner, NP [facility] None  08/03/2023  1:00 PM Maretta Shaper, PhD CP-CP None  08/18/2023 11:00 AM Maretta Shaper, PhD CP[facility] None  09/01/2023 11:00 AM Maretta Shaper, PhD [facility] None  09/16/2023 11:00 AM Maretta Shaper, PhD CP-[facility] None    No orders of the defined types were placed in this encounter.     -------------------------------

## 2023-08-03 ENCOUNTER — Ambulatory Visit (INDEPENDENT_AMBULATORY_CARE_PROVIDER_SITE_OTHER): Admitting: Psychiatry

## 2023-08-03 DIAGNOSIS — F411 Generalized anxiety disorder: Secondary | ICD-10-CM

## 2023-08-03 DIAGNOSIS — F339 Major depressive disorder, recurrent, unspecified: Secondary | ICD-10-CM | POA: Diagnosis not present

## 2023-08-03 DIAGNOSIS — F515 Nightmare disorder: Secondary | ICD-10-CM

## 2023-08-03 DIAGNOSIS — F5104 Psychophysiologic insomnia: Secondary | ICD-10-CM

## 2023-08-03 DIAGNOSIS — Z9189 Other specified personal risk factors, not elsewhere classified: Secondary | ICD-10-CM

## 2023-08-03 DIAGNOSIS — F40232 Fear of other medical care: Secondary | ICD-10-CM

## 2023-08-03 DIAGNOSIS — R69 Illness, unspecified: Secondary | ICD-10-CM | POA: Diagnosis not present

## 2023-08-03 NOTE — Progress Notes (Signed)
 Psychotherapy Progress Note [facility] Delora Ferry, PhD LP  Patient ID: Jamie Gamble [employer])    MRN: [id number] Therapy format: Individual psychotherapy Date: 08/03/2023      Start: 1:09p     Stop: 1:53p     Time Spent: 44 min Location: Telehealth visit -- I connected with this patient by an approved telecommunication method (video), with her informed consent, and verifying identity and patient privacy.  I was located at my office and patient at her home.  As needed, we discussed the limitations, risks, and security and privacy concerns associated with telehealth service, including the availability and conditions which currently govern in-person appointments and the possibility that 3rd-party payment may not be fully guaranteed and she may be responsible for charges.  After she indicated understanding, we proceeded with the session.  Also discussed treatment planning, as needed, including ongoing verbal agreement with the plan, the opportunity to ask and answer all questions, her demonstrated understanding of instructions, and her readiness to call the office should symptoms worsen or she feels she is in a crisis state and needs more immediate and tangible assistance.   Session narrative (presenting needs, interim history, self-report of stressors and symptoms, applications of prior therapy, status changes, and interventions made in session) Last seen by psychiatry last week, noted still flat, still worrying, still diligent about meds and CPAP, still largely ineffective for mood but some improvement sleeping with 25mg  Seroquel  (c. 5 hrs).  Daily activities largely confined to watching TV.  Takes 5mg  melatonin at bedtime, along with 100-200mg  trazodone .  Abilify  5mg  added, starting 1/2 dose for 1 week.  Today, states she wants "some encouragement" though vague what for.  Admittedly worries a lot about a range of things, mainly her health and wellbeing, relief from suffering, etc.   Reviewed medication compliance, diet, activities, and socialization all the same.  Pt asks for an opinion on whether someone suffering from cancer and someone from depression can be considered equal right to want to be done with living.  Denies suicidality.  Answered how it matters whether someone can legitimately do things to address the suffering in question, challenged that depression in many cases is addressable thought it seems intractable and can be maintained by thought patterns.  Of note that she understands herself to have had a stroke during ECT treatments, though that is unverified, and cardiology note from early 2023 characterizes it as stress and likely not a form of a stroke.    Probed socializing, activities, and assertiveness of late.  Says she has gotten Lorelee Roger to eat in with her.  Not feeling any better, but acknowledges it proves she can ask, rather than garbage can the idea or settle.  Probed wishes for what she would do with energy, opportunity, etc -- only vague, nonanswers.  Offered possibilities including varying her viewing, suggesting that seeing better things can lead to better feelings, too.  Suggested varying her viewing, seeking out more beauty and/or learning, e.g., through [place] nature documentaries.  Sensing mounting resistance, changed tactics, reviewing and filling in more of her history, probing for what has been fulfilling and motivating earlier in life.  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  Fulfilling while it lasted, but the program got cut.  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  Says she doesn't want to talk about much.  Offered for her to ask personal questions of her own as a show of vulnerability, sense of parity as doctor and patient, and fresh and unexpected idea of what therapy time can entail besides reviewing how she worries and getting stuck describing problems.  Offered that we can continue on this unconventional tack, so long as I still get to recommend behaviors she doesn't necessarily want to do, and she consider seriously how ruminating about insomnia and depression tend to make both of them that much stickier.  Therapeutic modalities: Cognitive Behavioral Therapy, Solution-Oriented/Positive Psychology, and Ego-Supportive  Mental Status/Observations:  Appearance:   Casual and pale     Behavior:  Resistant  Motor:  Normal  Speech/Language:   Clear and Coherent  Affect:  Constricted  Mood:  depressed  Thought process:  normal  Thought content:    Rumination  Sensory/Perceptual disturbances:    States sensory hypersensitivity at times  Orientation:  Fully oriented  Attention:  Good    Concentration:  Good  Memory:  WNL  Insight:    Fair  Judgment:   Good  Impulse Control:  Fair   Risk Assessment: Danger to Self: No Self-injurious Behavior: No Danger to Others: No Physical Aggression / Violence: No Duty to Warn: No Access to Firearms a concern: No  Assessment of progress:  stabilized  Diagnosis:   ICD-10-CM   1. Recurrent major depression resistant to treatment (HCC)  F33.9     2. Generalized anxiety disorder -- severe, with sensory hypersensitivity  F41.1     3. Nightmares  F51.5     4. Psychophysiological insomnia  F51.04     5. Fear of other medical care, particularly ECT  F40.232     6. social anxiety -- r/o byproduct of depression vs. SAD vs. Avoidant Personality  R69     7. At increased risk for social  isolation  Z91.89      Plan:  Antidepression priorities --  Reframe "trying" and worrying -- Notice things she does she labels as for "distraction" and reframe them as just ways of spending time, with no reference to what she is trying not to think about.  Practice letting it just be another piece of time.   Variety in (shut in) experience -- If reachable, vary her TV viewing, seek out things that are either beautiful or learn something (e.g., an [place] nature documentary), de-emphasizing judgment whether it's "working" and just see about taking it in. Still priority advice to Request some socializing with Lorelee Roger be done inside, for the sake of changing how her home feels Continue supplements -- NAC regularly in the morning, Mg L-theonate regularly in the evening Make a daily practice of seeing some sunlight in the morning Try to get out for something, not just have it all delivered Try to allow a neighbor to interact briefly, and say so if she'd like to get back home, trust it's not "rude" but honest Notice and dispute "I have to, but I can't" thinking while wrestling with tasks -- either determine I don't have to after all, or I actually can do a little something Prayer tone -- Asking God to "take  away" depression is honest, but asking help seeing better things and raising stronger responses is more constructive Notice any difference these efforts make, however small Behavioral/social activation -- advise be in touch with Lorelee Roger, try to contact Toastmaster's friends, and entertain asking someone else to come over or get out with them somehow; vary spending social time out in the world and within her home Sleep regulation -- Should continue orange glasses QPM to facilitate circadian signaling, sleep readiness, and sleep hardiness.  Best recommendation dosing sleep-assisting meds up to 1 hr before bed to help her move through initial insomnia and insomnia anxiety.  Option add melatonin 2-5mg  if  needed 1-2 hr ahead of bed.  Practice sleep hygiene, including comfortable arrangements, reduced lighting, and all caffeine stopped well ahead of bedtime (at least 6 hrs).  Use soothing imagery PRN for DFA.  Should adhere to CPAP and ensure proper maintenance.  For worry control, option to externalize worry thoughts by writing them down and then consciously decide whether to stay up and think -- out of bed -- or "park" them and sleep, with further option to offer them to God's care overnight, consistent with her faith.  Acknowledge and table any worry about whether she will sleep -- trust the natural sleep drive to come around, and if too wakeful, get out of bed for anything not too stimulating.  Trust variations in sleep experience, e.g., an uptick in dreams, as natural progression of catching up deficits and processing emotional life issues.  Medication options may include alpha blocker to turn down the volume on autonomic arousal, signal it safer to sleep. Physiological mood factors -- Start the day with good lighting, movement/stretching, and a few minutes grounding, outdoors if available/desired.  Vitamin levels are normal, but improved B12 and D could still be helpful.  Should get some moderate exercise as able, even just walking and stretching, to combat depressogenic inactivity effects. Self-esteem and intrusive guilt/shame thoughts -- PRN practices of blessing-counting and listing ways people have told/shown her she is lovable/capable.  No requirement to believe or agree, just consider.  Seek to notice and give self credit when she does anything constructive or principled for things that matter to her wellbeing or sense of purpose and for any efforts that are better than full avoidance.  As needed, self-affirm God would not be punishing her, it's a trick of depression to think it. Activities and socialization -- Continue to strongly recommend at least minimal outings to break rumination and stimulate her  emotional and behavioral systems.  As able, develop activities of interest.  Self-monitor for broad demotivation and reach out.  Known options in friend Arland Bellis, [place], and church activities at 3 locations, ranging from chair yoga to entertainment/fellowship group to more theological and philosophical study.  Maintain/restore system of friends beyond [place], which may include anyone available at housing community.  Dispute worry thoughts of being judged, and enter interpersonal space with permission to feel not so up to it, either.  [facility] [employer] can be available for supportive calls, assistance services, and possible facility-based recreation.  Home care companions when needed.  Self-monitor for impulse to more fully isolate, and in all involvements practice permission to be authentic about her tolerance for spending time in view or the presence of others.  OK with employment if regains interest. Placement concerns -- Independent living with PRN assistance should be fine for the foreseeable future, though she could use the interpersonal stimulus of more regular contact inside or outside the  confines of her home.  Available on request and consent to interpret her condition empowered persons among her family and friends. Specialized therapies and medical procedure anxiety -- ECT and Spravato  now on hold.  Despite cognitive recovery after ECT, the ideas of shock, anesthesia, and potentially hallucinogenic tranquilizer are all highly anxiety-provoking to her.  Pt understands and accepts the burden to notify treatment team of relapse in dark/dreadful mood, severe demotivation, or intractable insomnia.  Ask all necessary questions of health care, make sure to adequately comprehend recommendations rather than catastrophize without knowing.  For any procedure accepted, imagine the competence and caring of health care personnel in action, especially if it involves anesthesia and other vulnerable-making  procedures. Weight management -- Concur with modified Weight Watchers approach and improved food choices at her interest.  Encourage to stay mobile, watch portions, limit carbs, try the practice of leaving a bite, and phase in foods from the MIND Diet. Financial concerns -- As needed, follow through fact-finding and meeting with an advisor, in coordination with friend/POA Lorelee Roger.  Still OK to seek part-time work she can reach from her home if interested.  Maintain cordial working relationship with nephew/trustee Norvell Beers. Safety & welfare plan -- Continue pledge of no harm, see one or the other mental health provider at least 2/month until returning to normal activity outside the home.  Melisa Spray remains POA and cleared to confer in emergencies but not yet for planning interventions on her behalf.  Recommend all treatment team members stand willing to contact Lorelee Roger if any indications of compromised safety, or poor comprehension or judgment. Other recommendations/advice -- As may be noted above.  Continue to utilize previously learned skills ad lib. Medication compliance -- Maintain medication as prescribed and work faithfully with relevant prescriber(s) if any changes are desired or seem indicated. Crisis service -- Aware of call list and work-in appts.  Call the clinic on-call service, 988/hotline, 911, or present to [facility] or ER if any life-threatening psychiatric crisis. Followup -- Return for time as already scheduled, recommend sched ahead.  Next scheduled visit with me 08/18/2023.  Next scheduled in this office 08/14/2023.  Maretta Shaper, PhD Delora Ferry, PhD LP Clinical Psychologist, [facility] [address] [place], [place] [address] [phone]

## 2023-08-13 ENCOUNTER — Telehealth: Admitting: Adult Health

## 2023-08-14 ENCOUNTER — Telehealth (INDEPENDENT_AMBULATORY_CARE_PROVIDER_SITE_OTHER): Admitting: Adult Health

## 2023-08-14 ENCOUNTER — Encounter: Payer: Self-pay | Admitting: Adult Health

## 2023-08-14 ENCOUNTER — Telehealth: Payer: Self-pay | Admitting: Adult Health

## 2023-08-14 DIAGNOSIS — F339 Major depressive disorder, recurrent, unspecified: Secondary | ICD-10-CM | POA: Diagnosis not present

## 2023-08-14 DIAGNOSIS — G4700 Insomnia, unspecified: Secondary | ICD-10-CM

## 2023-08-14 DIAGNOSIS — F419 Anxiety disorder, unspecified: Secondary | ICD-10-CM | POA: Diagnosis not present

## 2023-08-14 DIAGNOSIS — F411 Generalized anxiety disorder: Secondary | ICD-10-CM

## 2023-08-14 MED ORDER — TRAZODONE HCL 100 MG PO TABS: ORAL_TABLET | ORAL | 1 refills | Status: DC

## 2023-08-14 NOTE — Telephone Encounter (Signed)
 Patient called in regarding prescription for Trazedone. States that a appt that it was increased from 1 tablet a night to 1-2 tablets a night. The pharmacy needs new prescription for 1-2 as they still have the 1 tablet a night prescription. Ph: [phone] Appt 6/24 Pharmacy [facility] [address] [place], [place]

## 2023-08-14 NOTE — Telephone Encounter (Signed)
 Sent Rx for 1-2 trazodone .

## 2023-08-14 NOTE — Progress Notes (Signed)
 Jamie Gamble [id number] [birth year] 78 y.o.  Virtual Visit via Video Note  I connected with pt @ on 08/14/23 at  1:00 PM EDT by a video enabled telemedicine application and verified that I am speaking with the correct person using two identifiers.   I discussed the limitations of evaluation and management by telemedicine and the availability of in person appointments. The patient expressed understanding and agreed to proceed.  I discussed the assessment and treatment plan with the patient. The patient was provided an opportunity to ask questions and all were answered. The patient agreed with the plan and demonstrated an understanding of the instructions.   The patient was advised to call back or seek an in-person evaluation if the symptoms worsen or if the condition fails to improve as anticipated.  I provided 25 minutes of non-face-to-face time during this encounter.  The patient was located at home.  The provider was located at [facility].   Jamie Camera, NP   Subjective:   Patient ID:  Jamie Gamble is a 78 y.o. (DOB 1945/10/31) female.  Chief Complaint: No chief complaint on file.   HPI Jamie Gamble presents for follow-up of GAD, insomnia and MDD.  Seeing therapist - Dr. Caroleen Churn   Describes mood today as "no different". Pleasant. Flat. Denies tearfulness. Mood symptoms - reports depression and anxiety - more depressed overall. Reports decreased interest and motivation. Reports getting out once a week to have lunch with a friend. Denies irritability. Denies panic attacks. Reports worry, rumination and over thinking - "mainly about my health". Reports mood as lower. Stating "I don't feel like I'm doing good". Reports taking medications as prescribed, but does not feel like they are helpful. Energy levels lower. Active, does not have a regular exercise routine. Reports walking to the mailbox once a week. Unable to enjoy some usual interests and activities. Single. Lives  alone. No family local.  Appetite decreased - eats twice a day. Weight gain - 174 pounds. Reports sleeping has improved since restarting Seroquel  at 25mg . Averages 5 hours, but still feels tired. Report using CPAP machine nightly. Denies daytime napping. Reports focus and concentration difficulties. Reports watching TV most of the day and into the evening. Managing minimal aspects of household. Retired. Denies SI or HI.   Denies AH or VH. Denies self harm. Denies substance use.   Previous ECT treatment Previous Spravato  treatmet  Previous medication trials: Trazodone , Hydroxyzine , Zoloft, Remeron, Seroquel , Vraylar , Abilify     Review of Systems:  Review of Systems  Musculoskeletal:  Negative for gait problem.  Neurological:  Negative for tremors.  Psychiatric/Behavioral:         Please refer to HPI    Medications: I have reviewed the patient's current medications.  Current Outpatient Medications  Medication Sig Dispense Refill   ARIPiprazole  (ABILIFY ) 5 MG tablet Take 1/2 tablet daily x 7 days, then take one tablet daily. 30 tablet 2   atorvastatin  (LIPITOR) 40 MG tablet Take 1 tablet (40 mg total) by mouth at bedtime. 90 tablet 1   FLUoxetine  (PROZAC ) 40 MG capsule Take 1 capsule (40 mg total) by mouth daily. 90 capsule 0   Glycerin-Hypromellose-PEG 400 (VISINE DRY EYE OP) Place 1 drop into both eyes daily as needed (dry eyes).     hydrochlorothiazide  (MICROZIDE ) 12.5 MG capsule Take 1 capsule (12.5 mg total) by mouth daily. 90 capsule 1   losartan  (COZAAR ) 25 MG tablet Take 1 tablet (25 mg total) by mouth daily. 90 tablet 1  pantoprazole  (PROTONIX ) 40 MG tablet Take 1 tablet (40 mg total) by mouth daily. 90 tablet 1   Probiotic Product (PROBIOTIC DAILY PO) Take 1 capsule by mouth daily.     QUEtiapine  (SEROQUEL ) 25 MG tablet Take one to two tablets at bedtime. 60 tablet 2   traZODone  (DESYREL ) 100 MG tablet Take two tablets at bedtime. 180 tablet 1   No current  facility-administered medications for this visit.    Medication Side Effects: None  Allergies:  Allergies  Allergen Reactions   Esomeprazole Diarrhea and Other (See Comments)    Past Medical History:  Diagnosis Date   Depression    GERD (gastroesophageal reflux disease)    Hyperlipidemia    Hypertension     Family History  Problem Relation Age of Onset   Depression Mother    Cancer Father    Depression Brother     Social History   Socioeconomic History   Marital status: Single    Spouse name: Not on file   Number of children: 0   Years of education: Not on file   Highest education level: Doctorate  Occupational History   Occupation: Retired [employer] Employee  Tobacco Use   Smoking status: Former   Smokeless tobacco: Never  Advertising account planner   Vaping status: Never Used  Substance and Sexual Activity   Alcohol use: Not Currently   Drug use: Never   Sexual activity: Not on file  Other Topics Concern   Not on file  Social History Narrative   Not on file   Social Drivers of Health   Financial Resource Strain: Low Risk  (05/06/2023)   Overall Financial Resource Strain (CARDIA)    Difficulty of Paying Living Expenses: Not very hard  Food Insecurity: No Food Insecurity (05/06/2023)   Hunger Vital Sign    Worried About Running Out of Food in the Last Year: Never true    Ran Out of Food in the Last Year: Never true  Transportation Needs: Patient Declined (05/06/2023)   PRAPARE - Transportation    Lack of Transportation (Medical): Patient declined    Lack of Transportation (Non-Medical): Patient declined  Physical Activity: Unknown (05/06/2023)   Exercise Vital Sign    Days of Exercise per Week: 0 days    Minutes of Exercise per Session: Not on file  Stress: Stress Concern Present (05/06/2023)   [employer] of Occupational Health - Occupational Stress Questionnaire    Feeling of Stress : To some extent  Social Connections: Unknown (05/06/2023)   Social Connection  and Isolation Panel [NHANES]    Frequency of Communication with Friends and Family: Twice a week    Frequency of Social Gatherings with Friends and Family: Once a week    Attends Religious Services: Patient declined    Database administrator or Organizations: Yes    Attends Engineer, structural: More than 4 times per year    Marital Status: Never married  Intimate Partner Violence: Unknown (06/28/2021)   Received from [employer], [facility]   HITS    Physically Hurt: Not on file    Insult or Talk Down To: Not on file    Threaten Physical Harm: Not on file    Scream or Curse: Not on file    Past Medical History, Surgical history, Social history, and Family history were reviewed and updated as appropriate.   Please see review of systems for further details on the patient's review from today.   Objective:   Physical  Exam:  There were no vitals taken for this visit.  Physical Exam Constitutional:      General: She is not in acute distress. Musculoskeletal:        General: No deformity.  Neurological:     Mental Status: She is alert and oriented to person, place, and time.     Coordination: Coordination normal.  Psychiatric:        Attention and Perception: Attention and perception normal. She does not perceive auditory or visual hallucinations.        Mood and Affect: Mood normal. Affect is not labile, blunt, angry or inappropriate.        Speech: Speech normal.        Behavior: Behavior normal.        Thought Content: Thought content normal. Thought content is not paranoid or delusional. Thought content does not include homicidal or suicidal ideation. Thought content does not include homicidal or suicidal plan.        Cognition and Memory: Cognition and memory normal.        Judgment: Judgment normal.     Comments: Insight intact     Lab Review:     Component Value Date/Time   NA 140 05/12/2023 1619   K 3.8 05/12/2023 1619   CL 102 05/12/2023 1619   CO2 29  05/12/2023 1619   GLUCOSE 102 (H) 05/12/2023 1619   BUN 17 05/12/2023 1619   BUN 15 07/06/2019 0000   CREATININE 0.79 05/12/2023 1619   CALCIUM  9.6 05/12/2023 1619   PROT 6.1 05/12/2023 1619   ALBUMIN 4.0 08/13/2021 0905   AST 21 05/12/2023 1619   ALT 14 05/12/2023 1619   ALKPHOS 48 08/13/2021 0905   BILITOT 0.2 05/12/2023 1619   GFRNONAA >60 06/04/2021 1154       Component Value Date/Time   WBC 7.0 05/12/2023 1619   RBC 4.34 05/12/2023 1619   HGB 13.9 05/12/2023 1619   HCT 40.6 05/12/2023 1619   PLT 284 05/12/2023 1619   MCV 93.5 05/12/2023 1619   MCH 32.0 05/12/2023 1619   MCHC 34.2 05/12/2023 1619   RDW 12.3 05/12/2023 1619   LYMPHSABS 0.7 08/13/2021 0905   MONOABS 0.3 08/13/2021 0905   EOSABS 315 05/12/2023 1619   BASOSABS 49 05/12/2023 1619    No results found for: "POCLITH", "LITHIUM"   No results found for: "PHENYTOIN", "PHENOBARB", "VALPROATE", "CBMZ"   .res Assessment: Plan:    Plan:  Seeing Delora Ferry for therapy   PDMP reviewed  Continue:  Prozac  40mg  daily Trazadone 100mg  - take one to two tablets at bedtime for sleep Seroquel  25mg  - 1 to 2 at bedtime     Melatonin 5mg  at hs  Followed by Delora Ferry - therapy   RTC 4 weeks   25 minutes spent dedicated to the care of this patient on the date of this encounter to include pre-visit review of records, ordering of medication, post visit documentation, and face-to-face time with the patient discussing MDD, anxiety and insomnia. Discussed continuing current medication regimen. Will leave off Abilify  due to constipation. Discussed alternative treatments - TMS and restarting ECT. Patient did experience improvements with ECT. Patient notes she will think about options and advise at next appointment.   Patient advised to contact office with any questions, adverse effects, or acute worsening in signs and symptoms.  Discussed potential metabolic side effects associated with atypical antipsychotics, as  well as potential risk for movement side effects. Advised pt to contact office if  movement side effects occur.   There are no diagnoses linked to this encounter.   Please see After Visit Summary for patient specific instructions.  Future Appointments  Date Time Provider Department Center  08/14/2023  1:00 PM Samyra Limb, Ursula Gardner, NP [facility] None  08/18/2023 11:00 AM Maretta Shaper, PhD [facility] None  09/01/2023 11:00 AM Maretta Shaper, PhD [facility] None  09/16/2023 11:00 AM Maretta Shaper, PhD [facility] None  10/06/2023  2:00 PM Maretta Shaper, PhD [facility] None  10/28/2023  2:00 PM Maretta Shaper, PhD [facility] None  11/17/2023  2:00 PM Mitchum, Porfirio Bristol, PhD [facility] None    No orders of the defined types were placed in this encounter.     -------------------------------

## 2023-08-18 ENCOUNTER — Ambulatory Visit (INDEPENDENT_AMBULATORY_CARE_PROVIDER_SITE_OTHER): Admitting: Psychiatry

## 2023-08-18 DIAGNOSIS — R69 Illness, unspecified: Secondary | ICD-10-CM | POA: Diagnosis not present

## 2023-08-18 DIAGNOSIS — F339 Major depressive disorder, recurrent, unspecified: Secondary | ICD-10-CM

## 2023-08-18 DIAGNOSIS — F411 Generalized anxiety disorder: Secondary | ICD-10-CM

## 2023-08-18 DIAGNOSIS — Z9189 Other specified personal risk factors, not elsewhere classified: Secondary | ICD-10-CM | POA: Diagnosis not present

## 2023-08-18 DIAGNOSIS — F40232 Fear of other medical care: Secondary | ICD-10-CM | POA: Diagnosis not present

## 2023-08-18 DIAGNOSIS — F5104 Psychophysiologic insomnia: Secondary | ICD-10-CM

## 2023-08-18 NOTE — Progress Notes (Signed)
 Psychotherapy Progress Note [facility] Delora Ferry, PhD LP  Patient ID: Jamie Gamble [facility])    MRN: [id number] Therapy format: Individual psychotherapy Date: 08/18/2023      Start: 11:19a     Stop: 11:58a     Time Spent: 39 min Location: Telehealth visit -- I connected with this patient by an approved telecommunication method (video), with her informed consent, and verifying identity and patient privacy.  I was located at my office and patient at her home.  As needed, we discussed the limitations, risks, and security and privacy concerns associated with telehealth service, including the availability and conditions which currently govern in-person appointments and the possibility that 3rd-party payment may not be fully guaranteed and she may be responsible for charges.  After she indicated understanding, we proceeded with the session.  Also discussed treatment planning, as needed, including ongoing verbal agreement with the plan, the opportunity to ask and answer all questions, her demonstrated understanding of instructions, and her readiness to call the office should symptoms worsen or she feels she is in a crisis state and needs more immediate and tangible assistance.   Session narrative (presenting needs, interim history, self-report of stressors and symptoms, applications of prior therapy, status changes, and interventions made in session) Has gone off Abilify  now after last conversation with Dr. Mozingo, and word is that it's down to nonchemical treatments -- ECT or TMS, so she's inclined toward .  Typical sleep pattern remains 5 hrs broken.  [place] doubts about whether she'll lose her memory, or go to sleep and something awful change.  Challenged to recall that she rose when she got ECT 2 yrs ago  Soft recall that cardiologist in [place] told her she had a (mini) stroke but does not seem clear on what that means, just that she was put on aspirin.  Denies harboring the idea that  she must be damaged from it and is set up for worse if she risks anything (contrary to what she has said about ECT and stroke risk).  Encouraged to at least think further about it, because it was witnessed by her good friend and treatment team that she perked up well with ECT and came through it without incident.  Depression staged a comeback as she harbored social anxiety and judgment about social circles she was involved in, starting with Sunday School class, and the cycle of worry, withdrawal, helplessness, and insomnia revived.  Psychiatry apprised via chat following session.  Raising the idea of involving PCP as well to help strengthen resolve and unison coaching to do the best-proven thing  Therapeutic modalities: Cognitive Behavioral Therapy, Solution-Oriented/Positive Psychology, Ego-Supportive, and Psycho-education/Bibliotherapy  Mental Status/Observations:  Appearance:   Casual, pale   Behavior:  Resistant  Motor:  passive  Speech/Language:   Clear and Coherent  Affect:  Depressed and Flat  Mood:  depressed  Thought process:  normal  Thought content:    Rumination  Sensory/Perceptual disturbances:    WNL  Orientation:  Fully oriented  Attention:  Fair    Concentration:  Fair  Memory:  WNL  Insight:    Fair  Judgment:   Good  Impulse Control:  Good   Risk Assessment: Danger to Self: No Self-injurious Behavior: No Danger to Others: No Physical Aggression / Violence: No Duty to Warn: No Access to Firearms a concern: No  Assessment of progress:  stabilized  Diagnosis:   ICD-10-CM   1. Recurrent major depression resistant to treatment (HCC)  F33.9  2. Generalized anxiety disorder -- severe, with sensory hypersensitivity  F41.1     3. Psychophysiological insomnia  F51.04     4. Fear of other medical care, particularly ECT  F40.232     5. social anxiety -- r/o byproduct of depression vs. SAD vs. Avoidant Personality  R69     6. At increased risk for social isolation   Z91.89      Plan:  Antidepression priorities --  Reframe "trying" and worrying -- Notice things she does she labels as for "distraction" and reframe them as just ways of spending time, with no reference to what she is trying not to think about.  Practice letting it just be another piece of time.   Variety in (shut in) experience -- If reachable, vary her TV viewing, seek out things that are either beautiful or learn something (e.g., an [place] nature documentary), de-emphasizing judgment whether it's "working" and just see about taking it in. Still priority advice to Request some socializing with Lorelee Roger be done inside, for the sake of changing how her home feels Continue supplements -- NAC regularly in the morning, Mg L-theonate regularly in the evening Make a daily practice of seeing some sunlight in the morning Try to get out for something, not just have it all delivered Try to allow a neighbor to interact briefly, and say so if she'd like to get back home, trust it's not "rude" but honest Notice and dispute "I have to, but I can't" thinking while wrestling with tasks -- either determine I don't have to after all, or I actually can do a little something Prayer tone -- Asking God to "take away" depression is honest, but asking help seeing better things and raising stronger responses is more constructive Notice any difference these efforts make, however small Behavioral/social activation -- advise be in touch with Lorelee Roger, try to contact Toastmaster's friends, and entertain asking someone else to come over or get out with them somehow; vary spending social time out in the world and within her home Sleep regulation -- Should continue orange glasses QPM to facilitate circadian signaling, sleep readiness, and sleep hardiness.  Best recommendation dosing sleep-assisting meds up to 1 hr before bed to help her move through initial insomnia and insomnia anxiety.  Option add melatonin 2-5mg  if needed 1-2 hr  ahead of bed.  Practice sleep hygiene, including comfortable arrangements, reduced lighting, and all caffeine stopped well ahead of bedtime (at least 6 hrs).  Use soothing imagery PRN for DFA.  Should adhere to CPAP and ensure proper maintenance.  For worry control, option to externalize worry thoughts by writing them down and then consciously decide whether to stay up and think -- out of bed -- or "park" them and sleep, with further option to offer them to God's care overnight, consistent with her faith.  Acknowledge and table any worry about whether she will sleep -- trust the natural sleep drive to come around, and if too wakeful, get out of bed for anything not too stimulating.  Trust variations in sleep experience, e.g., an uptick in dreams, as natural progression of catching up deficits and processing emotional life issues.  Medication options may include alpha blocker to turn down the volume on autonomic arousal, signal it safer to sleep. Physiological mood factors -- Start the day with good lighting, movement/stretching, and a few minutes grounding, outdoors if available/desired.  Vitamin levels are normal, but improved B12 and D could still be helpful.  Should get some moderate exercise  as able, even just walking and stretching, to combat depressogenic inactivity effects. Self-esteem and intrusive guilt/shame thoughts -- PRN practices of blessing-counting and listing ways people have told/shown her she is lovable/capable.  No requirement to believe or agree, just consider.  Seek to notice and give self credit when she does anything constructive or principled for things that matter to her wellbeing or sense of purpose and for any efforts that are better than full avoidance.  As needed, self-affirm God would not be punishing her, it's a trick of depression to think it. Activities and socialization -- Continue to strongly recommend at least minimal outings to break rumination and stimulate her emotional and  behavioral systems.  As able, develop activities of interest.  Self-monitor for broad demotivation and reach out.  Known options in friend Arland Bellis, [place], and church activities at 3 locations, ranging from chair yoga to entertainment/fellowship group to more theological and philosophical study.  Maintain/restore system of friends beyond [place], which may include anyone available at housing community.  Dispute worry thoughts of being judged, and enter interpersonal space with permission to feel not so up to it, either.  [employer] can be available for supportive calls, assistance services, and possible facility-based recreation.  Home care companions when needed.  Self-monitor for impulse to more fully isolate, and in all involvements practice permission to be authentic about her tolerance for spending time in view or the presence of others.  OK with employment if regains interest. Placement concerns -- Independent living with PRN assistance should be fine for the foreseeable future, though she could use the interpersonal stimulus of more regular contact inside or outside the confines of her home.  Available on request and consent to interpret her condition empowered persons among her family and friends. Specialized therapies and medical procedure anxiety -- ECT recommended based on successful experience.  Despite cognitive recovery after ECT, fear of medical procedures and fragility in body or mind are persistent and interfere with acceptance and decision-making; need to adopt willingness to take some kind of risk.  Where reckoning with fear., urged to ask all necessary questions of health care, make sure to adequately comprehend recommendations rather than catastrophize without knowing.  For any procedure accepted, imagine the competence and caring of health care personnel in action, especially if it involves anesthesia and other vulnerable-making procedures. Weight management -- Concur with  modified Weight Watchers approach and improved food choices as motivated.  .  Encourage to stay mobile, watch portions, limit carbs, try the practice of leaving a bite, and phase in foods from the MIND Diet. Financial concerns -- As needed, follow through fact-finding and meeting with an advisor, in coordination with friend/POA Lorelee Roger.  Still OK to seek part-time work she can reach from her home if interested.  Maintain cordial working relationship with nephew/trustee Norvell Beers. Safety & welfare plan -- Continue pledge of no harm, see one or the other mental health provider at least 2/month until returning to normal activity outside the home.  Melisa Spray remains POA and cleared to confer in emergencies but not yet for planning interventions on her behalf.  Recommend all treatment team members stand willing to contact Lorelee Roger if any indications of compromised safety, or poor comprehension or judgment. Other recommendations/advice -- As may be noted above.  Continue to utilize previously learned skills ad lib. Medication compliance -- Maintain medication as prescribed and work faithfully with relevant prescriber(s) if any changes are desired or seem indicated. Crisis service -- Aware of call  list and work-in appts.  Call the clinic on-call service, 988/hotline, 911, or present to [facility] or ER if any life-threatening psychiatric crisis. Followup -- Return for time as already scheduled.  Next scheduled visit with me 09/01/2023.  Next scheduled in this office 09/01/2023.  Maretta Shaper, PhD Delora Ferry, PhD LP Clinical Psychologist, [facility] [address] [place], [place] [address] [phone]

## 2023-09-01 ENCOUNTER — Ambulatory Visit: Admitting: Psychiatry

## 2023-09-01 DIAGNOSIS — F411 Generalized anxiety disorder: Secondary | ICD-10-CM

## 2023-09-01 DIAGNOSIS — R69 Illness, unspecified: Secondary | ICD-10-CM

## 2023-09-01 DIAGNOSIS — Z9189 Other specified personal risk factors, not elsewhere classified: Secondary | ICD-10-CM | POA: Diagnosis not present

## 2023-09-01 DIAGNOSIS — F5104 Psychophysiologic insomnia: Secondary | ICD-10-CM

## 2023-09-01 DIAGNOSIS — F515 Nightmare disorder: Secondary | ICD-10-CM | POA: Diagnosis not present

## 2023-09-01 DIAGNOSIS — F40232 Fear of other medical care: Secondary | ICD-10-CM

## 2023-09-01 DIAGNOSIS — F339 Major depressive disorder, recurrent, unspecified: Secondary | ICD-10-CM | POA: Diagnosis not present

## 2023-09-01 NOTE — Progress Notes (Signed)
 Psychotherapy Progress Note [facility] [name], PhD LP  Patient ID: Jamie Gamble [facility])    MRN: [id number] Therapy format: Individual psychotherapy Date: 09/01/2023      Start: 11:14a     Stop: 11:44a     Time Spent: 30 min Location: Telehealth visit -- I connected with this patient by an approved telecommunication method (video), with her informed consent, and verifying identity and patient privacy.  I was located at my office and patient at her home.  As needed, we discussed the limitations, risks, and security and privacy concerns associated with telehealth service, including the availability and conditions which currently govern in-person appointments and the possibility that 3rd-party payment may not be fully guaranteed and she may be responsible for charges.  After she indicated understanding, we proceeded with the session.  Also discussed treatment planning, as needed, including ongoing verbal agreement with the plan, the opportunity to ask and answer all questions, her demonstrated understanding of instructions, and her readiness to call the office should symptoms worsen or she feels she is in a crisis state and needs more immediate and tangible assistance.   Session narrative (presenting needs, interim history, self-report of stressors and symptoms, applications of prior therapy, status changes, and interventions made in session) Very anxious today about the appointment, and looming confrontation about accepting ECT.  Does not feel she could, at this point, but she has settled it with Arlean that no, she does not need to get an MRI to trust she is not stroke-prone.    Wants to return to 1 Seroquel  + 1 hydroxyzine  for sleep aid, feels it was the sweetest spot so far.  Noncommittal about whether she is doing any better controlling worry and dwelling on   Despite obvious signs of emotional withdrawal, like closing eyes and laying down the phone, pursued active listening,  verifying her wishes, and summarizing interpretation and recommendations at this point.    Remainder of allotted session time spent in conference with psychiatry.  Therapeutic modalities: Cognitive Behavioral Therapy, Solution-Oriented/Positive Psychology, Environmental manager, and Motivational Interviewing  Mental Status/Observations:  Appearance:   Casual   , pale  Behavior:  Resistant  Motor:  Normal  Speech/Language:   Clear and Coherent and minimal  Affect:  Depressed  Mood:  anxious and depressed  Thought process:  normal  Thought content:    Rumination  Sensory/Perceptual disturbances:    WNL  Orientation:  Fully oriented  Attention:  Good    Concentration:  Fair  Memory:  WNL  Insight:    Fair  Judgment:   Variable  Impulse Control:  Good   Risk Assessment: Danger to Self: No Self-injurious Behavior: No Danger to Others: No Physical Aggression / Violence: No Duty to Warn: No Access to Firearms a concern: No  Assessment of progress:  deteriorating -- in need of reassessment  Diagnosis:   ICD-10-CM   1. Recurrent major depression resistant to treatment (HCC)  F33.9     2. Generalized anxiety disorder -- severe, with sensory hypersensitivity  F41.1     3. Psychophysiological insomnia  F51.04     4. Fear of other medical care, particularly ECT  F40.232     5. social anxiety -- r/o byproduct of depression vs. SAD vs. Avoidant Personality  R69     6. At increased risk for social isolation  Z91.89     7. Nightmares  F51.5      Plan: Antidepression priorities --  Strong recommendation to ECT as  a proven method to re-motivate her and break obsessions Reframe trying and worrying -- Notice things she does that she labels as for distraction and reframe them as just ways of spending time, with no reference to what she is trying not to think about.  Practice letting it just be another piece of time, making do rather than fighting internal states, which are inherently  viewed as foreign and overwhelming. Find variety in shut in experience -- If reachable, vary TV viewing, seek out things that are either beautiful or learn something (e.g., an [place] nature documentary), de-emphasize judgment whether it's working and just see about taking it in, succeeding in variety. Request that some socializing with [name] be done inside, for the sake of changing how her home feels Make a daily practice of seeing some sunlight in the morning Try to get out for something, not just have it all delivered Try to allow a neighbor to interact briefly, and say so if she'd like to get back home, trust it's not rude but honest Notice and dispute I have to, but I can't thinking while wrestling with tasks -- either determine I don't have to after all, or I actually can do a little something Prayer tone -- Asking God to take away depression is honest, but asking help coping, strengthening, and noticing the good among the bad is more constructive Notice any difference these efforts make, however small Behavioral/social activation -- advise be in touch with [name], try to contact Toastmaster's friends, and entertain asking someone else to come over or get out with them somehow; vary spending social time out in the world and within her home Sleep regulation -- Should continue orange glasses QPM to facilitate circadian signaling, sleep readiness, and sleep hardiness.  Best recommendation dosing sleep-assisting meds up to 1 hr before bed to help her move through initial insomnia and insomnia anxiety.  Option add melatonin 2-5mg  if needed 1-2 hr ahead of bed.  Practice sleep hygiene, including comfortable arrangements, reduced lighting, and all caffeine stopped well ahead of bedtime (at least 6 hrs).  Use soothing imagery PRN for DFA.  Should adhere to CPAP and ensure proper maintenance.  For worry control, option to externalize worry thoughts by writing them down and then consciously decide  whether to stay up and think -- out of bed -- or park them and sleep, with further option to offer them to God's care overnight, consistent with her faith.  Acknowledge and table any worry about whether she will sleep -- trust the natural sleep drive to come around, and if too wakeful, get out of bed for anything not too stimulating.  Trust variations in sleep experience, e.g., an uptick in dreams, as natural progression of catching up deficits and processing emotional life issues.  Medication options may include alpha blocker to turn down the volume on autonomic arousal, signal it safer to sleep.   Physiological mood factors -- Start the day with good lighting, movement/stretching, and a few minutes grounding, outdoors if available/desired.  Vitamin levels are normal, but improved B12 and D could still be helpful.  Should get some moderate exercise as able, even just walking and stretching, to combat depressogenic inactivity effects.  Continue supplements -- NAC regularly in the morning, Mg L-theonate regularly in the evening. Self-esteem and intrusive guilt/shame thoughts -- PRN practices of blessing-counting and listing ways people have told/shown her she is lovable/capable.  No requirement to believe or agree, just consider.  Seek to notice and give self credit  when she does anything constructive or principled for things that matter to her wellbeing or sense of purpose and for any efforts that are better than full avoidance.  As needed, self-affirm God would not be punishing her, it's a trick of depression to think it. Activities and socialization -- Continue to strongly recommend at least minimal outings to break rumination and stimulate her emotional and behavioral systems.  As able, develop activities of interest.  Self-monitor for broad demotivation and reach out.  Known options in friend [name] [place], [place], and church activities at 3 locations, ranging from chair yoga to  entertainment/fellowship group to more theological and philosophical study.  Maintain/restore system of friends beyond [place], which may include anyone available at housing community.  Dispute worry thoughts of being judged, and enter interpersonal space with permission to feel not so up to it, either.  [employer] can be available for supportive calls, assistance services, and possible facility-based recreation.  Home care companions when needed.  Self-monitor for impulse to more fully isolate, and in all involvements practice permission to be authentic about her tolerance for spending time in view or the presence of others.  OK with employment if regains interest. Placement concerns -- Independent living with PRN assistance should be fine for the foreseeable future, though she could use the interpersonal stimulus of more regular contact inside or outside the confines of her home.  Available on request and consent to interpret her condition empowered persons among her family and friends.  May consider congregate living if it proves too difficult to motivate to ECT or to pull together and reclaim her lifestyle. Specialized therapies and medical procedure anxiety -- ECT recommended based on successful experience.  Despite cognitive recovery after ECT, fear of medical procedures and fragility in body or mind are persistent and interfere with acceptance and decision-making; need to adopt willingness to take some kind of risk.  Where reckoning with fear., urged to ask all necessary questions of health care, make sure to adequately comprehend recommendations rather than catastrophize without knowing.  For any procedure accepted, imagine the competence and caring of health care personnel in action, especially if it involves anesthesia and other vulnerable-making procedures. Weight management -- Concur with modified Weight Watchers approach and improved food choices as motivated.  .  Encourage to stay mobile, watch  portions, limit carbs, try the practice of leaving a bite, and phase in foods from the MIND Diet. Financial concerns -- As needed, follow through fact-finding and meeting with an advisor, in coordination with friend/POA [name].  Still OK to seek part-time work she can reach from her home if interested.  Maintain cordial working relationship with nephew/trustee Bonnie. Safety & welfare plan -- Continue pledge of no harm, see one or the other mental health provider at least 2/month until returning to normal activity outside the home.  Alyse [name] remains POA and cleared to confer in emergencies but not yet for planning interventions on her behalf.  Recommend all treatment team members stand willing to contact [name] if any indications of compromised safety, or poor comprehension or judgment. Coordination -- Regular conference with psychiatry Other recommendations/advice -- As may be noted above.  Continue to utilize previously learned skills ad lib. Medication compliance -- Maintain medication as prescribed and work faithfully with relevant prescriber(s) if any changes are desired or seem indicated. Crisis service -- Aware of call list and work-in appts.  Call the clinic on-call service, 988/hotline, 911, or present to [facility] or ER if any life-threatening  psychiatric crisis. Followup -- Return for time as already scheduled.  Next scheduled visit with me 09/16/2023.  Next scheduled in this office 09/15/2023.  Lamar Kendall, PhD [name], PhD LP Clinical Psychologist, [facility] [address], [address] [place], [place] [address] [phone]

## 2023-09-10 ENCOUNTER — Other Ambulatory Visit: Payer: Self-pay | Admitting: Adult Health

## 2023-09-10 DIAGNOSIS — F339 Major depressive disorder, recurrent, unspecified: Secondary | ICD-10-CM

## 2023-09-11 NOTE — Telephone Encounter (Signed)
 Pt called requesting refill of Prozac  40mg  to [facility].  Next appt 6/24

## 2023-09-15 ENCOUNTER — Telehealth: Admitting: Adult Health

## 2023-09-15 ENCOUNTER — Encounter: Payer: Self-pay | Admitting: Adult Health

## 2023-09-15 DIAGNOSIS — G4700 Insomnia, unspecified: Secondary | ICD-10-CM | POA: Diagnosis not present

## 2023-09-15 DIAGNOSIS — F411 Generalized anxiety disorder: Secondary | ICD-10-CM | POA: Diagnosis not present

## 2023-09-15 DIAGNOSIS — F339 Major depressive disorder, recurrent, unspecified: Secondary | ICD-10-CM | POA: Diagnosis not present

## 2023-09-15 DIAGNOSIS — F40232 Fear of other medical care: Secondary | ICD-10-CM | POA: Diagnosis not present

## 2023-09-15 MED ORDER — HYDROXYZINE HCL 25 MG PO TABS
25.0000 mg | ORAL_TABLET | Freq: Every day | ORAL | 2 refills | Status: DC
Start: 2023-09-15 — End: 2024-01-06

## 2023-09-15 NOTE — Progress Notes (Signed)
 Jamie Gamble [id number] 1945/05/08 78 y.o.  Virtual Visit via Video Note  I connected with pt @ on 09/15/23 at  2:00 PM EDT by a video enabled telemedicine application and verified that I am speaking with the correct person using two identifiers.   I discussed the limitations of evaluation and management by telemedicine and the availability of in person appointments. The patient expressed understanding and agreed to proceed.  I discussed the assessment and treatment plan with the patient. The patient was provided an opportunity to ask questions and all were answered. The patient agreed with the plan and demonstrated an understanding of the instructions.   The patient was advised to call back or seek an in-person evaluation if the symptoms worsen or if the condition fails to improve as anticipated.  I provided 25 minutes of non-face-to-face time during this encounter.  The patient was located at home.  The provider was located at [facility].   Angeline LOISE Sayers, NP   Subjective:   Patient ID:  Jamie Gamble is a 78 y.o. (DOB 1945/05/02) female.  Chief Complaint: No chief complaint on file.   HPI ARILLA HICE presents for follow-up of GAD, fear of ECT treatment, insomnia and MDD.  Seeing therapist - Dr. Marijean   Describes mood today as about the same. Pleasant. Flat. Denies tearfulness. Mood symptoms - reports depression and anxiety. Reports low interest and motivation. Reports leaving the house once a week to have lunch with a friend. Denies irritability. Denies panic attacks. Reports worry, rumination and over thinking - mostly about my health as always. Reports mood as lower. Stating I don't feel too good, but I can't do anything about it. Reports taking medications as prescribed, but does not feel like they are helpful. Energy levels lower. Active, does not have a regular exercise routine. Reports walking to the mailbox once a week. Unable to enjoy some usual  interests and activities. Single. Lives alone. No family local.  Appetite decreased - eats twice a day. Weight gain - 174 pounds. Reports sleeping about 5 hours a night with current medication regimen. Report using CPAP machine nightly. Denies daytime napping. Reports focus and concentration ok. Reports watching TV in the morning and watched it until she goes to bed. Managing minimal aspects of household - enough to get by. Has a Programmer, applications come by once every 6 weeks. Denies SI or HI.   Denies AH or VH. Denies self harm. Denies substance use.   Previous ECT treatment Previous [place]  treatmet  Previous medication trials: Trazodone , Hydroxyzine , Zoloft, Remeron, Seroquel , Vraylar , Abilify    Review of Systems:  Review of Systems  Musculoskeletal:  Negative for gait problem.  Neurological:  Negative for tremors.  Psychiatric/Behavioral:         Please refer to HPI    Medications: I have reviewed the patient's current medications.  Current Outpatient Medications  Medication Sig Dispense Refill   hydrOXYzine  (ATARAX ) 25 MG tablet Take 1 tablet (25 mg total) by mouth at bedtime. 30 tablet 2   atorvastatin  (LIPITOR) 40 MG tablet Take 1 tablet (40 mg total) by mouth at bedtime. 90 tablet 1   FLUoxetine  (PROZAC ) 40 MG capsule Take 1 capsule (40 mg total) by mouth daily. 90 capsule 0   Glycerin-Hypromellose-PEG 400 (VISINE DRY EYE OP) Place 1 drop into both eyes daily as needed (dry eyes).     hydrochlorothiazide  (MICROZIDE ) 12.5 MG capsule Take 1 capsule (12.5 mg total) by mouth daily. 90 capsule 1  losartan  (COZAAR ) 25 MG tablet Take 1 tablet (25 mg total) by mouth daily. 90 tablet 1   pantoprazole  (PROTONIX ) 40 MG tablet Take 1 tablet (40 mg total) by mouth daily. 90 tablet 1   Probiotic Product (PROBIOTIC DAILY PO) Take 1 capsule by mouth daily.     QUEtiapine  (SEROQUEL ) 25 MG tablet Take one to two tablets at bedtime. 60 tablet 2   traZODone  (DESYREL ) 100 MG tablet Take one to  two tablets at bedtime as needed. 180 tablet 1   No current facility-administered medications for this visit.    Medication Side Effects: None  Allergies:  Allergies  Allergen Reactions   Esomeprazole Diarrhea and Other (See Comments)    Past Medical History:  Diagnosis Date   Depression    GERD (gastroesophageal reflux disease)    Hyperlipidemia    Hypertension     Family History  Problem Relation Age of Onset   Depression Mother    Cancer Father    Depression Brother     Social History   Socioeconomic History   Marital status: Single    Spouse name: Not on file   Number of children: 0   Years of education: Not on file   Highest education level: Doctorate  Occupational History   Occupation: Retired [employer] Employee  Tobacco Use   Smoking status: Former   Smokeless tobacco: Never  Advertising account planner   Vaping status: Never Used  Substance and Sexual Activity   Alcohol use: Not Currently   Drug use: Never   Sexual activity: Not on file  Other Topics Concern   Not on file  Social History Narrative   Not on file   Social Drivers of Health   Financial Resource Strain: Low Risk  (05/06/2023)   Overall Financial Resource Strain (CARDIA)    Difficulty of Paying Living Expenses: Not very hard  Food Insecurity: No Food Insecurity (05/06/2023)   Hunger Vital Sign    Worried About Running Out of Food in the Last Year: Never true    Ran Out of Food in the Last Year: Never true  Transportation Needs: Patient Declined (05/06/2023)   PRAPARE - Transportation    Lack of Transportation (Medical): Patient declined    Lack of Transportation (Non-Medical): Patient declined  Physical Activity: Unknown (05/06/2023)   Exercise Vital Sign    Days of Exercise per Week: 0 days    Minutes of Exercise per Session: Not on file  Stress: Stress Concern Present (05/06/2023)   [employer] of Occupational Health - Occupational Stress Questionnaire    Feeling of Stress : To some extent   Social Connections: Unknown (05/06/2023)   Social Connection and Isolation Panel    Frequency of Communication with Friends and Family: Twice a week    Frequency of Social Gatherings with Friends and Family: Once a week    Attends Religious Services: Patient declined    Database administrator or Organizations: Yes    Attends Engineer, structural: More than 4 times per year    Marital Status: Never married  Intimate Partner Violence: Unknown (06/28/2021)   Received from [facility]   HITS    Physically Hurt: Not on file    Insult or Talk Down To: Not on file    Threaten Physical Harm: Not on file    Scream or Curse: Not on file    Past Medical History, Surgical history, Social history, and Family history were reviewed and updated as appropriate.  Please see review of systems for further details on the patient's review from today.   Objective:   Physical Exam:  There were no vitals taken for this visit.  Physical Exam Constitutional:      General: She is not in acute distress.  Musculoskeletal:        General: No deformity.   Neurological:     Mental Status: She is alert and oriented to person, place, and time.     Coordination: Coordination normal.   Psychiatric:        Attention and Perception: Attention and perception normal. She does not perceive auditory or visual hallucinations.        Mood and Affect: Mood is anxious and depressed. Affect is flat. Affect is not labile, blunt, angry or inappropriate.        Speech: Speech normal.        Behavior: Behavior normal.        Thought Content: Thought content normal. Thought content is not paranoid or delusional. Thought content does not include homicidal or suicidal ideation. Thought content does not include homicidal or suicidal plan.        Cognition and Memory: Cognition and memory normal.        Judgment: Judgment normal.     Comments: Insight intact     Lab Review:     Component Value Date/Time   NA  140 05/12/2023 1619   K 3.8 05/12/2023 1619   CL 102 05/12/2023 1619   CO2 29 05/12/2023 1619   GLUCOSE 102 (H) 05/12/2023 1619   BUN 17 05/12/2023 1619   BUN 15 07/06/2019 0000   CREATININE 0.79 05/12/2023 1619   CALCIUM  9.6 05/12/2023 1619   PROT 6.1 05/12/2023 1619   ALBUMIN 4.0 08/13/2021 0905   AST 21 05/12/2023 1619   ALT 14 05/12/2023 1619   ALKPHOS 48 08/13/2021 0905   BILITOT 0.2 05/12/2023 1619   GFRNONAA >60 06/04/2021 1154       Component Value Date/Time   WBC 7.0 05/12/2023 1619   RBC 4.34 05/12/2023 1619   HGB 13.9 05/12/2023 1619   HCT 40.6 05/12/2023 1619   PLT 284 05/12/2023 1619   MCV 93.5 05/12/2023 1619   MCH 32.0 05/12/2023 1619   MCHC 34.2 05/12/2023 1619   RDW 12.3 05/12/2023 1619   LYMPHSABS 0.7 08/13/2021 0905   MONOABS 0.3 08/13/2021 0905   EOSABS 315 05/12/2023 1619   BASOSABS 49 05/12/2023 1619    No results found for: POCLITH, LITHIUM   No results found for: PHENYTOIN, PHENOBARB, VALPROATE, CBMZ   .res Assessment: Plan:    Plan:  Seeing [name] for therapy   PDMP reviewed  Continue:  Prozac  40mg  daily Trazadone 100mg  - take one to two tablets at bedtime for sleep Seroquel  25mg  - 1 at bedtime    Add Hydroxyzine  25mg  at hs - has been taking one tablet at bedtime from a previous prescription and feels it is helpful.   Melatonin 5mg  at hs  Followed by [name] - therapy   RTC 4 weeks   25 minutes spent dedicated to the care of this patient on the date of this encounter to include pre-visit review of records, ordering of medication, post visit documentation, and face-to-face time with the patient discussing MDD, anxiety and insomnia. Discussed continuing current medication regimen. Discussed alternative treatments - TMS or ECT. Patient did experience improvements with previous ECT treatments. Reports ECT may be her best option, but does not know how to  overcome her fear of initiating it.   Patient advised to  contact office with any questions, adverse effects, or acute worsening in signs and symptoms.  Discussed potential metabolic side effects associated with atypical antipsychotics, as well as potential risk for movement side effects. Advised pt to contact office if movement side effects occur.   Diagnoses and all orders for this visit:  Recurrent major depression resistant to treatment (HCC)  Insomnia, unspecified type -     hydrOXYzine  (ATARAX ) 25 MG tablet; Take 1 tablet (25 mg total) by mouth at bedtime.  Generalized anxiety disorder -- severe, with sensory hypersensitivity  Fear of other medical care, particularly ECT     Please see After Visit Summary for patient specific instructions.  Future Appointments  Date Time Provider Department Center  09/16/2023 11:00 AM Marijean [place], PhD CP-CP None  10/06/2023  2:00 PM Marijean [place], PhD [facility] None  10/28/2023  2:00 PM Marijean [place], PhD CP[facility] None  11/17/2023  2:00 PM Mitchum, [place], PhD [facility] None    No orders of the defined types were placed in this encounter.     -------------------------------

## 2023-09-16 ENCOUNTER — Ambulatory Visit: Admitting: Psychiatry

## 2023-09-16 DIAGNOSIS — F339 Major depressive disorder, recurrent, unspecified: Secondary | ICD-10-CM

## 2023-09-16 DIAGNOSIS — R69 Illness, unspecified: Secondary | ICD-10-CM | POA: Diagnosis not present

## 2023-09-16 DIAGNOSIS — F515 Nightmare disorder: Secondary | ICD-10-CM | POA: Diagnosis not present

## 2023-09-16 DIAGNOSIS — F5104 Psychophysiologic insomnia: Secondary | ICD-10-CM

## 2023-09-16 DIAGNOSIS — F40232 Fear of other medical care: Secondary | ICD-10-CM

## 2023-09-16 DIAGNOSIS — Z9189 Other specified personal risk factors, not elsewhere classified: Secondary | ICD-10-CM | POA: Diagnosis not present

## 2023-09-16 NOTE — Progress Notes (Signed)
 Psychotherapy Progress Note [facility] Jamie Kendall, PhD LP  Patient ID: Jamie Gamble [facility])    MRN: [id number] Therapy format: Individual psychotherapy Date: 09/16/2023      Start: 11:10a     Stop: 11:32a     Time Spent: 22 min + coordination of care time Location: Telehealth visit -- I connected with this patient by an approved telecommunication method (video), with her informed consent, and verifying identity and patient privacy.  I was located at my office and patient at her home.  As needed, we discussed the limitations, risks, and security and privacy concerns associated with telehealth service, including the availability and conditions which currently govern in-person appointments and the possibility that 3rd-party payment may not be fully guaranteed and she may be responsible for charges.  After she indicated understanding, we proceeded with the session.  Also discussed treatment planning, as needed, including ongoing verbal agreement with the plan, the opportunity to ask and answer all questions, her demonstrated understanding of instructions, and her readiness to call the office should symptoms worsen or she feels she is in a crisis state and needs more immediate and tangible assistance.   Session narrative (presenting needs, interim history, self-report of stressors and symptoms, applications of prior therapy, status changes, and interventions made in session) Says sleep med strategy is working out fine -- now at 2 trazodone  100mg , 1 Seroquel  25mg , 1 hydroxyzine  25mg , and 1 melatonin 5mg .  Getting same amount of sleep but better quality, more regular, possibly more restful.  Still NMs, about the same.  Rates 5/10 sleep satisfaction.  Hope at this point is for less nightmares.  Did try prazosin  in April, vaguely recalls calling it off.  Confirms impression that things are not as drastic as they sounded last time, and no SI, just would like for depression to go on away,  though not urgent.  Still sees Jamie Gamble weekly, mostly goes out, comfortable with that.    Discussed outlook for therapy -- no real purposes she wants to pursue, and she is in q 1 month checkins with psychiatry, more focused on trying to get the most out of medication support for sleep, antidepression, and distress tolerance.  Given the capacity for boredom, stalemate, or seeming to create a problem she doesn't feel, proposed thinning out schedule.  Reviewed likely reasons to re-engage therapy sooner and reasonable uses of therapy time if motivated, confirmed willingness to engage if she senses dangerousness rising, and agreed to pass along messages to scheduling and psychiatry.  Also confirmed her wishes if Jamie Gamble should call with concerns (OK to receive information, contact before offering information or guidance to Jamie Gamble).  Therapeutic modalities: Cognitive Behavioral Therapy, Solution-Oriented/Positive Psychology, and Ego-Supportive  Mental Status/Observations:  Appearance:   Casual     Behavior:  Appropriate and less resistant, more settled  Motor:  Normal  Speech/Language:   Clear and Coherent  Affect:  Appropriate  Mood:  depressed  Thought process:  normal  Thought content:    WNL  Sensory/Perceptual disturbances:    WNL  Orientation:  Fully oriented  Attention:  Good    Concentration:  Good  Memory:  WNL  Insight:    Good  Judgment:   Variable  Impulse Control:  Good   Risk Assessment: Danger to Self: No Self-injurious Behavior: No Danger to Others: No Physical Aggression / Violence: No Duty to Warn: No Access to Firearms a concern: No  Assessment of progress:  stabilized  Diagnosis:   ICD-10-CM  1. Recurrent major depression resistant to treatment (HCC)  F33.9     2. Psychophysiological insomnia  F51.04    stable    3. Fear of other medical care, particularly ECT  F40.232     4. social anxiety -- r/o byproduct of depression vs. SAD vs. Avoidant Personality  R69     5.  At increased risk for social isolation  Z91.89     6. Nightmares  F51.5      Plan:  Amended terms of service -- Agree to slow down therapy contact at this point, given the absence of urgency or motivation to change.  Consent for communication with friend Jamie Gamble is now permission to receive info, check with Pt before disclosure.  Other aspects of plan below apply as ongoing recommendations and inventory of important feedback to date. Antidepression priorities --  Continue to consider ECT available as a proven method to re-motivate her and break obsessions, insofar as she needs.  Defer to Pt's discretion if needed, or to her ability to collect herself if it is too scary a prospect. Reframe trying and worrying -- Notice things she does that she labels as for distraction and reframe them as just ways of spending time, with no reference to what she is trying not to think about.  Practice letting it just be another piece of time, making do rather than fighting internal states, which are inherently viewed as foreign and overwhelming. Find variety in shut in experience -- If reachable, vary TV viewing, seek out things that are either beautiful or learn something (e.g., an [place] nature documentary), de-emphasize judgment whether it's working and just see about taking it in, succeeding in variety. Request that some socializing with Jamie Gamble be done inside, for the sake of changing how her home feels Make a daily practice of seeing some sunlight in the morning Try to get out for something, not just have it all delivered Try to allow a neighbor to interact briefly, and say so if she'd like to get back home, trust it's not rude but honest Notice and dispute I have to, but I can't thinking while wrestling with tasks -- either determine I don't have to after all, or I actually can do a little something Prayer tone -- Asking God to take away depression is honest, but asking help coping,  strengthening, and noticing the good among the bad is more constructive Notice any difference these efforts make, however small Behavioral/social activation -- advise be in touch with Jamie Gamble, try to contact Toastmaster's friends, and entertain asking someone else to come over or get out with them somehow; vary spending social time out in the world and within her home Sleep regulation -- Should continue orange glasses QPM to facilitate circadian signaling, sleep readiness, and sleep hardiness.  Best recommendation dosing sleep-assisting meds up to 1 hr before bed to help her move through initial insomnia and insomnia anxiety.  Option add melatonin 2-5mg  if needed 1-2 hr ahead of bed.  Practice sleep hygiene, including comfortable arrangements, reduced lighting, and all caffeine stopped well ahead of bedtime (at least 6 hrs).  Use soothing imagery PRN for DFA.  Should adhere to CPAP and ensure proper maintenance.  For worry control, option to externalize worry thoughts by writing them down and then consciously decide whether to stay up and think -- out of bed -- or park them and sleep, with further option to offer them to God's care overnight, consistent with her faith.  Acknowledge and table any  worry about whether she will sleep -- trust the natural sleep drive to come around, and if too wakeful, get out of bed for anything not too stimulating.  Trust variations in sleep experience, e.g., an uptick in dreams, as natural progression of catching up deficits and processing emotional life issues.  Medication options may include alpha blocker to turn down the volume on autonomic arousal, signal it safer to sleep.   Physiological mood factors -- Start the day with good lighting, movement/stretching, and a few minutes grounding, outdoors if available/desired.  Vitamin levels are normal, but improved B12 and D could still be helpful.  Should get some moderate exercise as able, even just walking and stretching, to combat  depressogenic inactivity effects.  Continue supplements -- NAC regularly in the morning, Mg L-theonate regularly in the evening. Self-esteem and intrusive guilt/shame thoughts -- PRN practices of blessing-counting and listing ways people have told/shown her she is lovable/capable.  No requirement to believe or agree, just consider.  Seek to notice and give self credit when she does anything constructive or principled for things that matter to her wellbeing or sense of purpose and for any efforts that are better than full avoidance.  As needed, self-affirm God would not be punishing her, it's a trick of depression to think it. Activities and socialization -- Continue to strongly recommend at least minimal outings to break rumination and stimulate her emotional and behavioral systems.  As able, develop activities of interest.  Self-monitor for broad demotivation and reach out.  Known options in friend Jamie Gamble [place], [place], and church activities at 3 locations, ranging from chair yoga to entertainment/fellowship group to more theological and philosophical study.  Maintain/restore system of friends beyond [place], which may include anyone available at housing community.  Dispute worry thoughts of being judged, and enter interpersonal space with permission to feel not so up to it, either.  [employer] can be available for supportive calls, assistance services, and possible facility-based recreation.  Home care companions when needed.  Self-monitor for impulse to more fully isolate, and in all involvements practice permission to be authentic about her tolerance for spending time in view or the presence of others.  OK with employment if regains interest. Placement concerns -- Independent living with PRN assistance should be fine for the foreseeable future, though she could use the interpersonal stimulus of more regular contact inside or outside the confines of her home.  Available on request and consent to  interpret her condition empowered persons among her family and friends.  May consider congregate living if it proves too difficult to motivate to ECT or to pull together and reclaim her lifestyle. Specialized therapies and medical procedure anxiety -- ECT recommended based on successful experience.  Despite cognitive recovery after ECT, fear of medical procedures and fragility in body or mind are persistent and interfere with acceptance and decision-making; need to adopt willingness to take some kind of risk.  Where reckoning with fear., urged to ask all necessary questions of health care, make sure to adequately comprehend recommendations rather than catastrophize without knowing.  For any procedure accepted, imagine the competence and caring of health care personnel in action, especially if it involves anesthesia and other vulnerable-making procedures. Weight management -- Concur with modified Weight Watchers approach and improved food choices as motivated.  .  Encourage to stay mobile, watch portions, limit carbs, try the practice of leaving a bite, and phase in foods from the MIND Diet. Financial concerns -- As needed, follow through  fact-finding and meeting with an advisor, in coordination with friend/POA Jamie Gamble.  Still OK to seek part-time work she can reach from her home if interested.  Maintain cordial working relationship with nephew/trustee Bonnie. Safety & welfare plan -- Continue pledge of no harm, see one or the other mental health provider at least 2/month until returning to normal activity outside the home.  Alyse Jamie Gamble remains POA and cleared to confer in emergencies but not yet for planning interventions on her behalf.  Recommend all treatment team members stand willing to contact Jamie Gamble if any indications of compromised safety, or poor comprehension or judgment. Coordination -- Regular conference with psychiatry Other recommendations/advice -- As may be noted above.  Continue to utilize previously  learned skills ad lib. Medication compliance -- Maintain medication as prescribed and work faithfully with relevant prescriber(s) if any changes are desired or seem indicated. Crisis service -- Aware of call list and work-in appts.  Call the clinic on-call service, 988/hotline, 911, or present to [facility] or ER if any life-threatening psychiatric crisis. Followup -- Return in about 6 weeks (around 10/28/2023).  Next scheduled visit with me 10/06/2023.  Next scheduled in this office 10/06/2023.  Lamar Kendall, PhD Jamie Kendall, PhD LP Clinical Psychologist, [facility] [address], [address] [place], [place] [address] [phone]

## 2023-10-01 DIAGNOSIS — G4733 Obstructive sleep apnea (adult) (pediatric): Secondary | ICD-10-CM | POA: Diagnosis not present

## 2023-10-06 ENCOUNTER — Ambulatory Visit: Admitting: Psychiatry

## 2023-10-13 ENCOUNTER — Telehealth: Admitting: Adult Health

## 2023-10-27 ENCOUNTER — Telehealth (INDEPENDENT_AMBULATORY_CARE_PROVIDER_SITE_OTHER): Admitting: Adult Health

## 2023-10-27 ENCOUNTER — Encounter: Payer: Self-pay | Admitting: Adult Health

## 2023-10-27 DIAGNOSIS — F339 Major depressive disorder, recurrent, unspecified: Secondary | ICD-10-CM

## 2023-10-27 DIAGNOSIS — F5104 Psychophysiologic insomnia: Secondary | ICD-10-CM

## 2023-10-27 DIAGNOSIS — F411 Generalized anxiety disorder: Secondary | ICD-10-CM | POA: Diagnosis not present

## 2023-10-27 DIAGNOSIS — G4700 Insomnia, unspecified: Secondary | ICD-10-CM | POA: Diagnosis not present

## 2023-10-27 NOTE — Progress Notes (Signed)
 Jamie Gamble [id number] [birth year] 78 y.o.  Virtual Visit via Video Note  I connected with pt @ on 10/27/23 at  3:30 PM EDT by a video enabled telemedicine application and verified that I am speaking with the correct person using two identifiers.   I discussed the limitations of evaluation and management by telemedicine and the availability of in person appointments. The patient expressed understanding and agreed to proceed.  I discussed the assessment and treatment plan with the patient. The patient was provided an opportunity to ask questions and all were answered. The patient agreed with the plan and demonstrated an understanding of the instructions.   The patient was advised to call back or seek an in-person evaluation if the symptoms worsen or if the condition fails to improve as anticipated.  I provided 15 minutes of non-face-to-face time during this encounter.  The patient was located at home.  The provider was located at Jamie Gamble.   Jamie LOISE Sayers, NP   Subjective:   Patient ID:  Jamie Gamble is a 78 y.o. (DOB June 04, 1945) female.  Chief Complaint: No chief complaint on file.   HPI Jamie Gamble presents for follow-up of GAD, insomnia and MDD.  Seeing therapist - Dr. Marijean   Describes mood today as doing about the same. Pleasant. Flat. Denies tearfulness. Mood symptoms - reports depression and anxiety. Reports lower interest and motivation. Denies irritability. Denies panic attacks. Reports worry, rumination and over thinking - same ol worries. Reports leaving the house once a week to have lunch with friend - Jamie Gamble.  Reports mood as lower. Stating I don't feel like I'm doing to good.  Reports taking medications as prescribed, but does not feel like they are helpful. Energy levels lower. Active, does not have a regular exercise routine. Reports walking to the mailbox once a week. Unable to enjoy some usual interests and activities. Single. Lives alone. No  family local.  Appetite adequate - eats 2 to 3 times a day. Weight stable - 174 pounds. Reports sleeping approximately 5 hours a night. Report using CPAP machine nightly. Denies daytime napping. Reports focus and concentration not that great. Reports watching TV throughout the day until she goes to bed. Managing minimal aspects of household. Has a Programmer, applications come by once every 6 weeks. Denies SI or HI.   Denies AH or VH. Denies self harm. Denies substance use.   Previous ECT treatment Previous [place]  treatmet  Previous medication trials: Trazodone , Hydroxyzine , Zoloft, Remeron, Seroquel , Vraylar , Abilify , Wellbutrin .  Review of Systems:  Review of Systems  Musculoskeletal:  Negative for gait problem.  Neurological:  Negative for tremors.  Gamble/Behavioral:         Please refer to HPI    Medications: I have reviewed the patient's current medications.  Current Outpatient Medications  Medication Sig Dispense Refill   atorvastatin  (LIPITOR) 40 MG tablet Take 1 tablet (40 mg total) by mouth at bedtime. 90 tablet 1   FLUoxetine  (PROZAC ) 40 MG capsule Take 1 capsule (40 mg total) by mouth daily. 90 capsule 0   Glycerin-Hypromellose-PEG 400 (VISINE DRY EYE OP) Place 1 drop into both eyes daily as needed (dry eyes).     hydrochlorothiazide  (MICROZIDE ) 12.5 MG capsule Take 1 capsule (12.5 mg total) by mouth daily. 90 capsule 1   hydrOXYzine  (ATARAX ) 25 MG tablet Take 1 tablet (25 mg total) by mouth at bedtime. 30 tablet 2   losartan  (COZAAR ) 25 MG tablet Take 1 tablet (25 mg total) by mouth  daily. 90 tablet 1   pantoprazole  (PROTONIX ) 40 MG tablet Take 1 tablet (40 mg total) by mouth daily. 90 tablet 1   Probiotic Product (PROBIOTIC DAILY PO) Take 1 capsule by mouth daily.     QUEtiapine  (SEROQUEL ) 25 MG tablet Take one to two tablets at bedtime. 60 tablet 2   traZODone  (DESYREL ) 100 MG tablet Take one to two tablets at bedtime as needed. 180 tablet 1   No current  facility-administered medications for this visit.    Medication Side Effects: None  Allergies:  Allergies  Allergen Reactions   Esomeprazole Diarrhea and Other (See Comments)    Past Medical History:  Diagnosis Date   Depression    GERD (gastroesophageal reflux disease)    Hyperlipidemia    Hypertension     Family History  Problem Relation Age of Onset   Depression Mother    Cancer Father    Depression Brother     Social History   Socioeconomic History   Marital status: Single    Spouse name: Not on file   Number of children: 0   Years of education: Not on file   Highest education level: Doctorate  Occupational History   Occupation: Retired [employer] Employee  Tobacco Use   Smoking status: Former   Smokeless tobacco: Never  Advertising account planner   Vaping status: Never Used  Substance and Sexual Activity   Alcohol use: Not Currently   Drug use: Never   Sexual activity: Not on file  Other Topics Concern   Not on file  Social History Narrative   Not on file   Social Drivers of Health   Financial Resource Strain: Low Risk  (05/06/2023)   Overall Financial Resource Strain (CARDIA)    Difficulty of Paying Living Expenses: Not very hard  Food Insecurity: No Food Insecurity (05/06/2023)   Hunger Vital Sign    Worried About Running Out of Food in the Last Year: Never true    Ran Out of Food in the Last Year: Never true  Transportation Needs: Patient Declined (05/06/2023)   PRAPARE - Transportation    Lack of Transportation (Medical): Patient declined    Lack of Transportation (Non-Medical): Patient declined  Physical Activity: Unknown (05/06/2023)   Exercise Vital Sign    Days of Exercise per Week: 0 days    Minutes of Exercise per Session: Not on file  Stress: Stress Concern Present (05/06/2023)   [employer] of Occupational Health - Occupational Stress Questionnaire    Feeling of Stress : To some extent  Social Connections: Unknown (05/06/2023)   Social Connection  and Isolation Panel    Frequency of Communication with Friends and Family: Twice a week    Frequency of Social Gatherings with Friends and Family: Once a week    Attends Religious Services: Patient declined    Database administrator or Organizations: Yes    Attends Engineer, structural: More than 4 times per year    Marital Status: Never married  Intimate Partner Violence: Unknown (06/28/2021)   Received from [facility]   HITS    Physically Hurt: Not on file    Insult or Talk Down To: Not on file    Threaten Physical Harm: Not on file    Scream or Curse: Not on file    Past Medical History, Surgical history, Social history, and Family history were reviewed and updated as appropriate.   Please see review of systems for further details on the patient's review from  today.   Objective:   Physical Exam:  There were no vitals taken for this visit.  Physical Exam Constitutional:      General: She is not in acute distress. Musculoskeletal:        General: No deformity.  Neurological:     Mental Status: She is alert and oriented to person, place, and time.     Coordination: Coordination normal.  Gamble:        Attention and Perception: Attention and perception normal. She does not perceive auditory or visual hallucinations.        Mood and Affect: Mood normal. Mood is not anxious or depressed. Affect is not labile, blunt, angry or inappropriate.        Speech: Speech normal.        Behavior: Behavior normal.        Thought Content: Thought content normal. Thought content is not paranoid or delusional. Thought content does not include homicidal or suicidal ideation. Thought content does not include homicidal or suicidal plan.        Cognition and Memory: Cognition and memory normal.        Judgment: Judgment normal.     Comments: Insight intact     Lab Review:     Component Value Date/Time   NA 140 05/12/2023 1619   K 3.8 05/12/2023 1619   CL 102 05/12/2023 1619    CO2 29 05/12/2023 1619   GLUCOSE 102 (H) 05/12/2023 1619   BUN 17 05/12/2023 1619   BUN 15 07/06/2019 0000   CREATININE 0.79 05/12/2023 1619   CALCIUM  9.6 05/12/2023 1619   PROT 6.1 05/12/2023 1619   ALBUMIN 4.0 08/13/2021 0905   AST 21 05/12/2023 1619   ALT 14 05/12/2023 1619   ALKPHOS 48 08/13/2021 0905   BILITOT 0.2 05/12/2023 1619   GFRNONAA >60 06/04/2021 1154       Component Value Date/Time   WBC 7.0 05/12/2023 1619   RBC 4.34 05/12/2023 1619   HGB 13.9 05/12/2023 1619   HCT 40.6 05/12/2023 1619   PLT 284 05/12/2023 1619   MCV 93.5 05/12/2023 1619   MCH 32.0 05/12/2023 1619   MCHC 34.2 05/12/2023 1619   RDW 12.3 05/12/2023 1619   LYMPHSABS 0.7 08/13/2021 0905   MONOABS 0.3 08/13/2021 0905   EOSABS 315 05/12/2023 1619   BASOSABS 49 05/12/2023 1619    No results found for: POCLITH, LITHIUM   No results found for: PHENYTOIN, PHENOBARB, VALPROATE, CBMZ   .res Assessment: Plan:    Plan:  Seeing [name] for therapy   PDMP reviewed  Continue:  Prozac  40mg  daily Trazadone 100mg  - take one to two tablets at bedtime for sleep Seroquel  25mg  - 1 at bedtime    Hydroxyzine  25mg  at hs - has been taking one tablet at bedtime from a previous prescription and feels it is helpful.   Melatonin 5mg  at hs  Followed by [name] - therapy   RTC 4 weeks   15 minutes spent dedicated to the care of this patient on the date of this encounter to include pre-visit review of records, ordering of medication, post visit documentation, and face-to-face time with the patient discussing MDD, anxiety and insomnia. Discussed continuing current medication regimen. Discussed alternative treatments - TMS or ECT. Patient did experience improvements with previous ECT treatments. Reports ECT may be her best option, but does not know how to overcome her fear of initiating it.   Patient advised to contact office with any questions, adverse  effects, or acute worsening in  signs and symptoms.  Discussed potential metabolic side effects associated with atypical antipsychotics, as well as potential risk for movement side effects. Advised pt to contact office if movement side effects occur.   There are no diagnoses linked to this encounter.   Please see After Visit Summary for patient specific instructions.  Future Appointments  Date Time Provider Department Center  10/27/2023  3:30 PM Khaled Herda, Jamie [place]ocks, NP [facility] None  10/28/2023  2:00 PM Jamie Gamble [place], PhD [facility] None  11/17/2023  2:00 PM Jamie Gamble [place], PhD [facility] None  01/19/2024  1:00 PM Luke Chiquita SAUNDERS, DO [facility]    No orders of the defined types were placed in this encounter.     -------------------------------

## 2023-10-28 ENCOUNTER — Ambulatory Visit (INDEPENDENT_AMBULATORY_CARE_PROVIDER_SITE_OTHER): Admitting: Psychiatry

## 2023-10-28 DIAGNOSIS — R69 Illness, unspecified: Secondary | ICD-10-CM | POA: Diagnosis not present

## 2023-10-28 DIAGNOSIS — F339 Major depressive disorder, recurrent, unspecified: Secondary | ICD-10-CM | POA: Diagnosis not present

## 2023-10-28 DIAGNOSIS — Z9189 Other specified personal risk factors, not elsewhere classified: Secondary | ICD-10-CM | POA: Diagnosis not present

## 2023-10-28 DIAGNOSIS — F40232 Fear of other medical care: Secondary | ICD-10-CM | POA: Diagnosis not present

## 2023-10-28 DIAGNOSIS — F411 Generalized anxiety disorder: Secondary | ICD-10-CM

## 2023-10-28 NOTE — Progress Notes (Signed)
 Psychotherapy Progress Note [facility] Jamie Kendall, PhD LP  Patient ID: Jamie Gamble [facility])    MRN: [id number] Therapy format: Individual psychotherapy Date: 10/28/2023      Start: 2:18p     Stop: 2:48p     Time Spent: 30 min Location: Telehealth visit -- I connected with this patient by an approved telecommunication method (video), with her informed consent, and verifying identity and patient privacy.  I was located at my office and patient at her home.  As needed, we discussed the limitations, risks, and security and privacy concerns associated with telehealth service, including the availability and conditions which currently govern in-person appointments and the possibility that 3rd-party payment may not be fully guaranteed and she may be responsible for charges.  After she indicated understanding, we proceeded with the session.  Also discussed treatment planning, as needed, including ongoing verbal agreement with the plan, the opportunity to ask and answer all questions, her demonstrated understanding of instructions, and her readiness to call the office should symptoms worsen or she feels she is in a crisis state and needs more immediate and tangible assistance.   Session narrative (presenting needs, interim history, self-report of stressors and symptoms, applications of prior therapy, status changes, and interventions made in session) Says she keeps having dreams about her brother Jamie Gamble, except it's not suicide, he's murdered by dream people.  Mystified by it.  Interpreted that she's dreaming metaphor, much like if we said his depression killed him.  Happened around 6 years ago, which on review is in response to awful hurts  Would like more restful sleep, still afflicted with nightmares, frequently waking.  Repeated dreams of being tired, and of worrying she won't get well, both of which reflect typical daytime experience.  As she has been, not inclined to make any choices to  get more social contact.  Some dreams that sound like wish fulfillment -- feeling better, socializing, helping people.  Still awake through the day, not napping.  Says daytime anxiety, she claims just as much as when she was coming in for Spravato , but not specifics  Re meds, still takes the list as described in Dr. Hank note yesterday.  Sometimes NAC in the morning, sometimes MVI, calcium , vit D.  Sometimes Mg at night.  Suggested time to test vit D again but declined.  Reviewed chart for genetic testing -- MTHFR was ruled out, but a number of meds marked for slow metabolism, levels may build up, including Prozac .  Noted that experience with senior psychiatrist here has been that [facility]OI has re-engaged patients who were not able to be reached with newer agents, but it gets complicated; offered to share with psychiatrist on her behalf but declined.  Socially, says her time with Arlean is satisfactory, no changes wished.  No complaints or urging from [place] to do more, do differently, challenge anything.  Given her continued stance that she does not want to venture anything in terms of activities, techniques, or medicine modifications, agreed to abbreviate the session.  Encouraged to challenge herself where possible.  Therapeutic modalities: Cognitive Behavioral Therapy, Solution-Oriented/Positive Psychology, and Ego-Supportive  Mental Status/Observations:  Appearance:   Casual     Behavior:  Resistant  Motor:  Normal  Speech/Language:   Clear and Coherent and diminished production  Affect:  Appropriate and Flat  Mood:  anxious and depressed  Thought process:  normal  Thought content:    WNL  Sensory/Perceptual disturbances:    WNL  Orientation:  Fully  oriented  Attention:  Good    Concentration:  Good  Memory:  WNL  Insight:    Fair  Judgment:   Fair  Impulse Control:  Good   Risk Assessment: Danger to Self: No Self-injurious Behavior: No Danger to Others: No Physical Aggression /  Violence: No Duty to Warn: No Access to Firearms a concern: No  Assessment of progress:  stabilized  Diagnosis:   ICD-10-CM   1. Recurrent major depression resistant to treatment (HCC)  F33.9     2. Generalized anxiety disorder -- severe, with sensory hypersensitivity  F41.1     3. Fear of other medical care, particularly ECT  F40.232     4. social anxiety -- r/o byproduct of depression vs. SAD vs. Avoidant Personality  R69     5. At increased risk for social isolation  Z91.89      Plan:  Amended terms of service -- Agree to slow down therapy contact at this point, given the absence of urgency or motivation to change.  Consent for communication with friend Arlean is now permission to receive info, check with Pt before disclosure.  Other aspects of plan below apply as ongoing recommendations and inventory of important feedback to date. Antidepression priorities --  Continue to consider ECT available as a proven method to re-motivate her and break obsessions, insofar as she needs.  Defer to Pt's discretion if needed, or to her ability to collect herself if it is too scary a prospect. Reframe trying and worrying -- Notice things she does that she labels as for distraction and reframe them as just ways of spending time, with no reference to what she is trying not to think about.  Practice letting it just be another piece of time, making do rather than fighting internal states, which are inherently viewed as foreign and overwhelming. Find variety in shut in experience -- If reachable, vary TV viewing, seek out things that are either beautiful or learn something (e.g., an [place] nature documentary), de-emphasize judgment whether it's working and just see about taking it in, succeeding in variety. Request that some socializing with [name] be done inside, for the sake of changing how her home feels Make a daily practice of seeing some sunlight in the morning Try to get out for  something, not just have it all delivered Try to allow a neighbor to interact briefly, and say so if she'd like to get back home, trust it's not rude but honest Notice and dispute I have to, but I can't thinking while wrestling with tasks -- either determine I don't have to after all, or I actually can do a little something Prayer tone -- Asking God to take away depression is honest, but asking help coping, strengthening, and noticing the good among the bad is more constructive Notice any difference these efforts make, however small Behavioral/social activation -- advise be in touch with [name], try to contact Toastmaster's friends, and entertain asking someone else to come over or get out with them somehow; vary spending social time out in the world and within her home Sleep regulation -- Should continue orange glasses QPM to facilitate circadian signaling, sleep readiness, and sleep hardiness.  Best recommendation dosing sleep-assisting meds up to 1 hr before bed to help her move through initial insomnia and insomnia anxiety.  Option add melatonin 2-5mg  if needed 1-2 hr ahead of bed.  Practice sleep hygiene, including comfortable arrangements, reduced lighting, and all caffeine stopped well ahead of bedtime (at least 6  hrs).  Use soothing imagery PRN for DFA.  Should adhere to CPAP and ensure proper maintenance.  For worry control, option to externalize worry thoughts by writing them down and then consciously decide whether to stay up and think -- out of bed -- or park them and sleep, with further option to offer them to God's care overnight, consistent with her faith.  Acknowledge and table any worry about whether she will sleep -- trust the natural sleep drive to come around, and if too wakeful, get out of bed for anything not too stimulating.  Trust variations in sleep experience, e.g., an uptick in dreams, as natural progression of catching up deficits and processing emotional life issues.   Medication options may include alpha blocker to turn down the volume on autonomic arousal, signal it safer to sleep.   Physiological mood factors -- Start the day with good lighting, movement/stretching, and a few minutes grounding, outdoors if available/desired.  Vitamin levels are normal, but improved B12 and D could still be helpful.  Should get some moderate exercise as able, even just walking and stretching, to combat depressogenic inactivity effects.  Continue supplements -- NAC regularly in the morning, Mg L-theonate regularly in the evening. Self-esteem and intrusive guilt/shame thoughts -- PRN practices of blessing-counting and listing ways people have told/shown her she is lovable/capable.  No requirement to believe or agree, just consider.  Seek to notice and give self credit when she does anything constructive or principled for things that matter to her wellbeing or sense of purpose and for any efforts that are better than full avoidance.  As needed, self-affirm God would not be punishing her, it's a trick of depression to think it. Activities and socialization -- Continue to strongly recommend at least minimal outings to break rumination and stimulate her emotional and behavioral systems.  As able, develop activities of interest.  Self-monitor for broad demotivation and reach out.  Known options in friend [name] Dana, [place], and church activities at 3 locations, ranging from chair yoga to entertainment/fellowship group to more theological and philosophical study.  Maintain/restore system of friends beyond [place], which may include anyone available at housing community.  Dispute worry thoughts of being judged, and enter interpersonal space with permission to feel not so up to it, either.  [facility] [employer] can be available for supportive calls, assistance services, and possible facility-based recreation.  Home care companions when needed.  Self-monitor for impulse to more fully isolate, and in  all involvements practice permission to be authentic about her tolerance for spending time in view or the presence of others.  OK with employment if regains interest. Placement concerns -- Independent living with PRN assistance should be fine for the foreseeable future, though she could use the interpersonal stimulus of more regular contact inside or outside the confines of her home.  Available on request and consent to interpret her condition empowered persons among her family and friends.  May consider congregate living if it proves too difficult to motivate to ECT or to pull together and reclaim her lifestyle. Specialized therapies and medical procedure anxiety -- ECT recommended based on successful experience.  Despite cognitive recovery after ECT, fear of medical procedures and fragility in body or mind are persistent and interfere with acceptance and decision-making; need to adopt willingness to take some kind of risk.  Where reckoning with fear., urged to ask all necessary questions of health care, make sure to adequately comprehend recommendations rather than catastrophize without knowing.  For any procedure  accepted, imagine the competence and caring of health care personnel in action, especially if it involves anesthesia and other vulnerable-making procedures. Weight management -- Concur with modified Weight Watchers approach and improved food choices as motivated.  Encourage to stay mobile, watch portions, limit carbs, try the practice of leaving a bite, and phase in foods from the MIND Diet. Financial concerns -- As needed, follow through fact-finding and meeting with an advisor, in coordination with friend/POA [name].  Still OK to seek part-time work she can reach from her home if interested.  Maintain cordial working relationship with nephew/trustee Bonnie. Safety & welfare plan -- Continue pledge of no harm, see one or the other mental health provider at least 2/month until returning to normal  activity outside the home.  Alyse [name] remains POA and cleared to confer in emergencies but not yet for planning interventions on her behalf.  Recommend all treatment team members stand willing to contact [name] if any indications of compromised safety, or poor comprehension or judgment. Coordination -- Regular conference with psychiatry Other recommendations/advice -- As may be noted above.  Continue to utilize previously learned skills ad lib. Medication compliance -- Maintain medication as prescribed and work faithfully with relevant prescriber(s) if any changes are desired or seem indicated. Crisis service -- Aware of call list and work-in appts.  Call the clinic on-call service, 988/hotline, 911, or present to [facility] or ER if any life-threatening psychiatric crisis. Followup -- Return for time as already scheduled, avail earlier @ PT's need.  Next scheduled visit with me 12/16/2023.  Next scheduled in this office 11/24/2023.  Lamar Kendall, PhD Jamie Kendall, PhD LP Clinical Psychologist, [facility] [address] [place], [place] [address] [phone]

## 2023-11-17 ENCOUNTER — Ambulatory Visit: Admitting: Psychiatry

## 2023-11-24 ENCOUNTER — Other Ambulatory Visit: Payer: Self-pay | Admitting: Internal Medicine

## 2023-11-24 ENCOUNTER — Encounter: Payer: Self-pay | Admitting: Adult Health

## 2023-11-24 ENCOUNTER — Telehealth (INDEPENDENT_AMBULATORY_CARE_PROVIDER_SITE_OTHER): Admitting: Adult Health

## 2023-11-24 DIAGNOSIS — F411 Generalized anxiety disorder: Secondary | ICD-10-CM | POA: Diagnosis not present

## 2023-11-24 DIAGNOSIS — G4700 Insomnia, unspecified: Secondary | ICD-10-CM | POA: Diagnosis not present

## 2023-11-24 DIAGNOSIS — F339 Major depressive disorder, recurrent, unspecified: Secondary | ICD-10-CM

## 2023-11-24 MED ORDER — RISPERIDONE 0.5 MG PO TABS: 0.5000 mg | ORAL_TABLET | Freq: Every day | ORAL | Status: DC

## 2023-11-24 MED ORDER — RISPERIDONE 0.5 MG PO TABS: 0.5000 mg | ORAL_TABLET | Freq: Every day | ORAL | 2 refills | Status: DC

## 2023-11-24 NOTE — Progress Notes (Signed)
 Jamie Gamble [id number] Feb 04, 1946 78 y.o.  Virtual Visit via Video Note  I connected with pt @ on 11/24/23 at  3:00 PM EDT by a video enabled telemedicine application and verified that I am speaking with the correct person using two identifiers.   I discussed the limitations of evaluation and management by telemedicine and the availability of in person appointments. The patient expressed understanding and agreed to proceed.  I discussed the assessment and treatment plan with the patient. The patient was provided an opportunity to ask questions and all were answered. The patient agreed with the plan and demonstrated an understanding of the instructions.   The patient was advised to call back or seek an in-person evaluation if the symptoms worsen or if the condition fails to improve as anticipated.  I provided 20 minutes of non-face-to-face time during this encounter.  The patient was located at home.  The provider was located at [facility].   Jamie LOISE Sayers, NP   Subjective:   Patient ID:  Jamie Gamble is a 78 y.o. (DOB 1945-12-27) female.  Chief Complaint: No chief complaint on file.   HPI Jamie Gamble presents for follow-up of GAD, insomnia and MDD.  Seeing therapist - Dr. Marijean   Describes mood today as doing about the same. Pleasant. Flat. Denies tearfulness. Mood symptoms - reports depression - pretty high. Reports lower interest and motivation. Reports anxiety about her health. Denies irritability. Denies panic attacks. Reports worry, rumination and over thinking - worried about my health. Reports leaving the house once a week to have lunch with friend - [name]. Reports mood as lower. Stating I don't feel like I'm doing good. Reports taking medications as prescribed, but does not feel like they are helpful. Energy levels lower - the same. Active, does not have a regular exercise routine. Reports walking to the mailbox once a week. Unable to enjoy some usual  interests and activities. Single. Lives alone. No family local. Reports going out to eat once a week with friend [name]. Appetite adequate - reports eating 2 to 3 times a day - mostly sandwiches. Weight stable - 174 pounds. Reports sleeping approximately 5 hours a night. Report using CPAP machine nightly. Denies daytime napping. Reports focus and concentration it's fair. Reports watching TV throughout the day. Managing minimal aspects of household - just enough to get by. Has a Programmer, applications come by once every 6 weeks. Denies SI or HI.   Denies AH or VH. Denies self harm. Denies substance use.   Previous ECT treatment Previous Spravato  treatmet  Previous medication trials: Trazodone , Hydroxyzine , Zoloft, Remeron, Seroquel , Vraylar , Abilify , Wellbutrin .  Review of Systems:  Review of Systems  Musculoskeletal:  Negative for gait problem.  Neurological:  Negative for tremors.  Psychiatric/Behavioral:         Please refer to HPI    Medications: I have reviewed the patient's current medications.  Current Outpatient Medications  Medication Sig Dispense Refill   atorvastatin  (LIPITOR) 40 MG tablet Take 1 tablet (40 mg total) by mouth at bedtime. 90 tablet 1   FLUoxetine  (PROZAC ) 40 MG capsule Take 1 capsule (40 mg total) by mouth daily. 90 capsule 0   Glycerin-Hypromellose-PEG 400 (VISINE DRY EYE OP) Place 1 drop into both eyes daily as needed (dry eyes).     hydrochlorothiazide  (MICROZIDE ) 12.5 MG capsule Take 1 capsule (12.5 mg total) by mouth daily. 90 capsule 1   hydrOXYzine  (ATARAX ) 25 MG tablet Take 1 tablet (25 mg total) by  mouth at bedtime. 30 tablet 2   losartan  (COZAAR ) 25 MG tablet Take 1 tablet (25 mg total) by mouth daily. 90 tablet 1   pantoprazole  (PROTONIX ) 40 MG tablet Take 1 tablet (40 mg total) by mouth daily. 90 tablet 1   Probiotic Product (PROBIOTIC DAILY PO) Take 1 capsule by mouth daily.     risperiDONE  (RISPERDAL ) 0.5 MG tablet Take 1 tablet (0.5 mg total) by mouth  at bedtime.     traZODone  (DESYREL ) 100 MG tablet Take one to two tablets at bedtime as needed. 180 tablet 1   No current facility-administered medications for this visit.    Medication Side Effects: None  Allergies:  Allergies  Allergen Reactions   Esomeprazole Diarrhea and Other (See Comments)    Past Medical History:  Diagnosis Date   Depression    GERD (gastroesophageal reflux disease)    Hyperlipidemia    Hypertension     Family History  Problem Relation Age of Onset   Depression Mother    Cancer Father    Depression Brother     Social History   Socioeconomic History   Marital status: Single    Spouse name: Not on file   Number of children: 0   Years of education: Not on file   Highest education level: Doctorate  Occupational History   Occupation: Retired [employer] Employee  Tobacco Use   Smoking status: Former   Smokeless tobacco: Never  Advertising account planner   Vaping status: Never Used  Substance and Sexual Activity   Alcohol use: Not Currently   Drug use: Never   Sexual activity: Not on file  Other Topics Concern   Not on file  Social History Narrative   Not on file   Social Drivers of Health   Financial Resource Strain: Low Risk  (05/06/2023)   Overall Financial Resource Strain (CARDIA)    Difficulty of Paying Living Expenses: Not very hard  Food Insecurity: No Food Insecurity (05/06/2023)   Hunger Vital Sign    Worried About Running Out of Food in the Last Year: Never true    Ran Out of Food in the Last Year: Never true  Transportation Needs: Patient Declined (05/06/2023)   PRAPARE - Transportation    Lack of Transportation (Medical): Patient declined    Lack of Transportation (Non-Medical): Patient declined  Physical Activity: Unknown (05/06/2023)   Exercise Vital Sign    Days of Exercise per Week: 0 days    Minutes of Exercise per Session: Not on file  Stress: Stress Concern Present (05/06/2023)   [employer] of Occupational Health - Occupational  Stress Questionnaire    Feeling of Stress : To some extent  Social Connections: Unknown (05/06/2023)   Social Connection and Isolation Panel    Frequency of Communication with Friends and Family: Twice a week    Frequency of Social Gatherings with Friends and Family: Once a week    Attends Religious Services: Patient declined    Database administrator or Organizations: Yes    Attends Engineer, structural: More than 4 times per year    Marital Status: Never married  Intimate Partner Violence: Unknown (06/28/2021)   Received from [facility]   HITS    Physically Hurt: Not on file    Insult or Talk Down To: Not on file    Threaten Physical Harm: Not on file    Scream or Curse: Not on file    Past Medical History, Surgical history, Social history, and  Family history were reviewed and updated as appropriate.   Please see review of systems for further details on the patient's review from today.   Objective:   Physical Exam:  There were no vitals taken for this visit.  Physical Exam Constitutional:      General: She is not in acute distress. Musculoskeletal:        General: No deformity.  Neurological:     Mental Status: She is alert and oriented to person, place, and time.     Coordination: Coordination normal.  Psychiatric:        Attention and Perception: Attention and perception normal. She does not perceive auditory or visual hallucinations.        Mood and Affect: Mood normal. Mood is not anxious or depressed. Affect is not labile, blunt, angry or inappropriate.        Speech: Speech normal.        Behavior: Behavior normal.        Thought Content: Thought content normal. Thought content is not paranoid or delusional. Thought content does not include homicidal or suicidal ideation. Thought content does not include homicidal or suicidal plan.        Cognition and Memory: Cognition and memory normal.        Judgment: Judgment normal.     Comments: Insight intact      Lab Review:     Component Value Date/Time   NA 140 05/12/2023 1619   K 3.8 05/12/2023 1619   CL 102 05/12/2023 1619   CO2 29 05/12/2023 1619   GLUCOSE 102 (H) 05/12/2023 1619   BUN 17 05/12/2023 1619   BUN 15 07/06/2019 0000   CREATININE 0.79 05/12/2023 1619   CALCIUM  9.6 05/12/2023 1619   PROT 6.1 05/12/2023 1619   ALBUMIN 4.0 08/13/2021 0905   AST 21 05/12/2023 1619   ALT 14 05/12/2023 1619   ALKPHOS 48 08/13/2021 0905   BILITOT 0.2 05/12/2023 1619   GFRNONAA >60 06/04/2021 1154       Component Value Date/Time   WBC 7.0 05/12/2023 1619   RBC 4.34 05/12/2023 1619   HGB 13.9 05/12/2023 1619   HCT 40.6 05/12/2023 1619   PLT 284 05/12/2023 1619   MCV 93.5 05/12/2023 1619   MCH 32.0 05/12/2023 1619   MCHC 34.2 05/12/2023 1619   RDW 12.3 05/12/2023 1619   LYMPHSABS 0.7 08/13/2021 0905   MONOABS 0.3 08/13/2021 0905   EOSABS 315 05/12/2023 1619   BASOSABS 49 05/12/2023 1619    No results found for: POCLITH, LITHIUM   No results found for: PHENYTOIN, PHENOBARB, VALPROATE, CBMZ   .res Assessment: Plan:   Plan:  Seeing [name] for therapy   PDMP reviewed  Continue:  Prozac  40mg  daily Trazadone 100mg  - take one to two tablets at bedtime for sleep  Add Risperdal  0.5mg  at bedtime - genesight reviewed. D/C Seroquel  25mg  - 1 at bedtime    Hydroxyzine  25mg  at hs - has been taking one tablet at bedtime from a previous prescription and feels it is helpful.   Melatonin 5mg  at hs  Followed by [name] - therapy   RTC 4 weeks   20 minutes spent dedicated to the care of this patient on the date of this encounter to include pre-visit review of records, ordering of medication, post visit documentation, and face-to-face time with the patient discussing MDD, anxiety and insomnia. Discussed continuing current medication regimen.   Discussed alternative treatments - TMS or ECT.   Patient advised  to contact office with any questions, adverse  effects, or acute worsening in signs and symptoms.  Discussed potential metabolic side effects associated with atypical antipsychotics, as well as potential risk for movement side effects. Advised pt to contact office if movement side effects occur.   Diagnoses and all orders for this visit:  Recurrent major depression resistant to treatment (HCC) -     Discontinue: risperiDONE  (RISPERDAL ) 0.5 MG tablet; Take 1 tablet (0.5 mg total) by mouth at bedtime. -     risperiDONE  (RISPERDAL ) 0.5 MG tablet; Take 1 tablet (0.5 mg total) by mouth at bedtime.     Please see After Visit Summary for patient specific instructions.  Future Appointments  Date Time Provider Department Center  12/16/2023  1:00 PM Marijean [place], PhD [facility] None  01/19/2024  1:00 PM Luke Chiquita SAUNDERS, DO [facility] [address]  01/27/2024  2:00 PM Marijean [place], PhD [facility] None    No orders of the defined types were placed in this encounter.     -------------------------------

## 2023-12-01 ENCOUNTER — Other Ambulatory Visit: Payer: Self-pay | Admitting: Internal Medicine

## 2023-12-14 ENCOUNTER — Telehealth: Admitting: Adult Health

## 2023-12-15 ENCOUNTER — Other Ambulatory Visit: Payer: Self-pay | Admitting: Internal Medicine

## 2023-12-16 ENCOUNTER — Ambulatory Visit: Admitting: Psychiatry

## 2023-12-16 DIAGNOSIS — F411 Generalized anxiety disorder: Secondary | ICD-10-CM | POA: Diagnosis not present

## 2023-12-16 DIAGNOSIS — F5104 Psychophysiologic insomnia: Secondary | ICD-10-CM

## 2023-12-16 DIAGNOSIS — R69 Illness, unspecified: Secondary | ICD-10-CM | POA: Diagnosis not present

## 2023-12-16 DIAGNOSIS — F40232 Fear of other medical care: Secondary | ICD-10-CM | POA: Diagnosis not present

## 2023-12-16 DIAGNOSIS — Z9189 Other specified personal risk factors, not elsewhere classified: Secondary | ICD-10-CM | POA: Diagnosis not present

## 2023-12-16 DIAGNOSIS — F339 Major depressive disorder, recurrent, unspecified: Secondary | ICD-10-CM

## 2023-12-16 NOTE — Progress Notes (Signed)
 Psychotherapy Progress Note [facility] [name], PhD LP  Patient ID: Jamie Gamble [facility])    MRN: [id number] Therapy format: Individual psychotherapy Date: 12/16/2023      Start: 1:07p     Stop: 1:34p     Time Spent: 27 min Location: Telehealth visit -- I connected with this patient by an approved telecommunication method (video), with her informed consent, and verifying identity and patient privacy.  I was located at my office and patient at her home.  As needed, we discussed the limitations, risks, and security and privacy concerns associated with telehealth service, including the availability and conditions which currently govern in-person appointments and the possibility that 3rd-party payment may not be fully guaranteed and she may be responsible for charges.  After she indicated understanding, we proceeded with the session.  Also discussed treatment planning, as needed, including ongoing verbal agreement with the plan, the opportunity to ask and answer all questions, her demonstrated understanding of instructions, and her readiness to call the office should symptoms worsen or she feels she is in a crisis state and needs more immediate and tangible assistance.   Session narrative (presenting needs, interim history, self-report of stressors and symptoms, applications of prior therapy, status changes, and interventions made in session) Last seen by psychiatry, 3 week ago, video as usual.  Reports same with higher anxiety and depression, 1/wk getting out with [name], 1/wk going to mailbox.  Sleep/appetite stable, using CPAP appropriately.  Housekeeper q 6 wks.    Confirms sleeping OK, appetite stable.  Says she does want to get some of her interest back, not sure what.  Probed interest in either receiving or providing calls, neither of interest.  Probed curiosity about TMS, says we have talked about it before, no interest.  Probed bucket list if 6 months to live -- no, can't think  of.  Probed whether anything from her childhood might be a spark, can't think of  Got agreement to think about searching TED talks for someone talking about their experience rekindling interest.      Therapeutic modalities: Cognitive Behavioral Therapy, Solution-Oriented/Positive Psychology, and Ego-Supportive  Mental Status/Observations:  Appearance:   Casual     Behavior:  Resistant  Motor:  Normal  Speech/Language:   fluent  Affect:  Appropriate  Mood:  depressed  Thought process:  normal  Thought content:    Rumination  Sensory/Perceptual disturbances:    WNL  Orientation:  Fully oriented  Attention:  Good    Concentration:  Good  Memory:  WNL  Insight:    Good  Judgment:   Fair  Impulse Control:  Fair   Risk Assessment: Danger to Self: No Self-injurious Behavior: No Danger to Others: No Physical Aggression / Violence: No Duty to Warn: No Access to Firearms a concern: No  Assessment of progress:  stabilized  Diagnosis:   ICD-10-CM   1. Recurrent major depression resistant to treatment  F33.9     2. Generalized anxiety disorder -- severe, with sensory hypersensitivity  F41.1     3. social anxiety -- r/o byproduct of depression vs. SAD vs. Avoidant Personality  R69     4. Fear of other medical care, particularly ECT  F40.232     5. At increased risk for social isolation  Z91.89     6. Psychophysiological insomnia  F51.04      Plan:  Amended terms of service -- Agree to slow down therapy contact at this point, given the absence of urgency  or motivation to change.  Consent for communication with friend Arlean is now permission to receive info, check with Pt before disclosure.  Other aspects of plan below apply as ongoing recommendations and inventory of important feedback to date. Antidepression priorities --  Continue to consider ECT available as a proven method to re-motivate her and break obsessions, insofar as she needs.  Defer to Pt's discretion if needed, or  to her ability to collect herself if it is too scary a prospect. Reframe trying and worrying -- Notice things she does that she labels as for distraction and reframe them as just ways of spending time, with no reference to what she is trying not to think about.  Practice letting it just be another piece of time, making do rather than fighting internal states, which are inherently viewed as foreign and overwhelming. Find variety in shut in experience -- If reachable, vary TV viewing, seek out things that are either beautiful or learn something (e.g., an [place] nature documentary), de-emphasize judgment whether it's working and just see about taking it in, succeeding in variety. Request that some socializing with [name] be done inside, for the sake of changing how her home feels Make a daily practice of seeing some sunlight in the morning Try to get out for something, not just have it all delivered Try to allow a neighbor to interact briefly, and say so if she'd like to get back home, trust it's not rude but honest Notice and dispute I have to, but I can't thinking while wrestling with tasks -- either determine I don't have to after all, or I actually can do a little something Prayer tone -- Asking God to take away depression is honest, but asking help coping, strengthening, and noticing the good among the bad is more constructive Notice any difference these efforts make, however small Behavioral/social activation -- advise be in touch with [name], try to contact Toastmaster[employer] friends, and entertain asking someone else to come over or get out with them somehow; vary spending social time out in the world and within her home Sleep regulation -- Should continue orange glasses QPM to facilitate circadian signaling, sleep readiness, and sleep hardiness.  Best recommendation dosing sleep-assisting meds up to 1 hr before bed to help her move through initial insomnia and insomnia anxiety.  Option  add melatonin 2-5mg  if needed 1-2 hr ahead of bed.  Practice sleep hygiene, including comfortable arrangements, reduced lighting, and all caffeine stopped well ahead of bedtime (at least 6 hrs).  Use soothing imagery PRN for DFA.  Should adhere to CPAP and ensure proper maintenance.  For worry control, option to externalize worry thoughts by writing them down and then consciously decide whether to stay up and think -- out of bed -- or park them and sleep, with further option to offer them to God's care overnight, consistent with her faith.  Acknowledge and table any worry about whether she will sleep -- trust the natural sleep drive to come around, and if too wakeful, get out of bed for anything not too stimulating.  Trust variations in sleep experience, e.g., an uptick in dreams, as natural progression of catching up deficits and processing emotional life issues.  Medication options may include alpha blocker to turn down the volume on autonomic arousal, signal it safer to sleep.   Physiological mood factors -- Start the day with good lighting, movement/stretching, and a few minutes grounding, outdoors if available/desired.  Vitamin levels are normal, but improved B12 and D  could still be helpful.  Should get some moderate exercise as able, even just walking and stretching, to combat depressogenic inactivity effects.  Continue supplements -- NAC regularly in the morning, Mg L-theonate regularly in the evening. Self-esteem and intrusive guilt/shame thoughts -- PRN practices of blessing-counting and listing ways people have told/shown her she is lovable/capable.  No requirement to believe or agree, just consider.  Seek to notice and give self credit when she does anything constructive or principled for things that matter to her wellbeing or sense of purpose and for any efforts that are better than full avoidance.  As needed, self-affirm God would not be punishing her, it's a trick of depression to think  it. Activities and socialization -- Continue to strongly recommend at least minimal outings to break rumination and stimulate her emotional and behavioral systems.  As able, develop activities of interest.  Self-monitor for broad demotivation and reach out.  Known options in friend [name] Dana, [employer], and church activities at 3 locations, ranging from chair yoga to entertainment/fellowship group to more theological and philosophical study.  Maintain/restore system of friends beyond [place], which may include anyone available at housing community.  Dispute worry thoughts of being judged, and enter interpersonal space with permission to feel not so up to it, either.  [employer] can be available for supportive calls, assistance services, and possible facility-based recreation.  Home care companions when needed.  Self-monitor for impulse to more fully isolate, and in all involvements practice permission to be authentic about her tolerance for spending time in view or the presence of others.  OK with employment if regains interest. Placement concerns -- Independent living with PRN assistance should be fine for the foreseeable future, though she could use the interpersonal stimulus of more regular contact inside or outside the confines of her home.  Available on request and consent to interpret her condition empowered persons among her family and friends.  May consider congregate living if it proves too difficult to motivate to ECT or to pull together and reclaim her lifestyle. Specialized therapies and medical procedure anxiety -- ECT recommended based on successful experience, but resisted.  Despite cognitive recovery after ECT, fear of medical procedures and fragility in body or mind are persistent and interfere with acceptance and decision-making; need to adopt willingness to take some kind of risk.  Spravato  tried and unable to abide due to anxiety about doing it right and having dissociative chemistry  working on her.  TMS broached and rejected.  Where reckoning with fear, urged to ask all necessary questions of health care, make sure to adequately comprehend recommendations rather than catastrophize without knowing.  For any procedure accepted, imagine the competence and caring of health care personnel in action, especially if it involves anesthesia and other vulnerable-making procedures. Weight management -- Concur with modified Weight Watchers approach and improved food choices as motivated.  Encourage to stay mobile, watch portions, limit carbs, try the practice of leaving a bite, and phase in foods from the MIND Diet. Financial concerns -- As needed, follow through fact-finding and meeting with an advisor, in coordination with friend/POA [name].  Still OK to seek part-time work she can reach from her home if interested.  Maintain cordial working relationship with nephew/trustee Bonnie. Safety & welfare plan -- Continue pledge of no harm, see one or the other mental health provider at least 2/month until returning to normal activity outside the home.  Alyse [name] remains POA and cleared to confer in emergencies but not  yet for planning interventions on her behalf.  Recommend all treatment team members stand willing to contact [name] if any indications of compromised safety, or poor comprehension or judgment. Coordination -- Regular conference with psychiatry Other recommendations/advice -- As may be noted above.  Continue to utilize previously learned skills ad lib. Medication compliance -- Maintain medication as prescribed and work faithfully with relevant prescriber(s) if any changes are desired or seem indicated. Crisis service -- Aware of call list and work-in appts.  Call the clinic on-call service, 988/hotline, 911, or present to [facility] or ER if any life-threatening psychiatric crisis. Followup -- Return for time as already scheduled, avail earlier @ PT's need.  Next scheduled visit with me 01/27/2024.   Next scheduled in this office 01/07/2024.  Lamar Kendall, PhD [name], PhD LP Clinical Psychologist, [facility] [address] [place], [place] [address] [phone]

## 2023-12-22 ENCOUNTER — Telehealth: Admitting: Adult Health

## 2023-12-23 ENCOUNTER — Other Ambulatory Visit: Payer: Self-pay | Admitting: Adult Health

## 2023-12-23 DIAGNOSIS — F339 Major depressive disorder, recurrent, unspecified: Secondary | ICD-10-CM

## 2024-01-05 ENCOUNTER — Other Ambulatory Visit: Payer: Self-pay | Admitting: Adult Health

## 2024-01-05 DIAGNOSIS — G4700 Insomnia, unspecified: Secondary | ICD-10-CM

## 2024-01-06 ENCOUNTER — Ambulatory Visit (INDEPENDENT_AMBULATORY_CARE_PROVIDER_SITE_OTHER): Admitting: Adult Health

## 2024-01-06 ENCOUNTER — Encounter: Payer: Self-pay | Admitting: Adult Health

## 2024-01-06 DIAGNOSIS — G4700 Insomnia, unspecified: Secondary | ICD-10-CM

## 2024-01-06 DIAGNOSIS — F339 Major depressive disorder, recurrent, unspecified: Secondary | ICD-10-CM | POA: Diagnosis not present

## 2024-01-06 DIAGNOSIS — F411 Generalized anxiety disorder: Secondary | ICD-10-CM | POA: Diagnosis not present

## 2024-01-06 MED ORDER — HYDROXYZINE HCL 25 MG PO TABS: 25.0000 mg | ORAL_TABLET | Freq: Every day | ORAL | 0 refills | Status: DC

## 2024-01-06 MED ORDER — FLUOXETINE HCL 40 MG PO CAPS: 40.0000 mg | ORAL_CAPSULE | Freq: Every day | ORAL | 0 refills | Status: DC

## 2024-01-06 MED ORDER — TRAZODONE HCL 100 MG PO TABS: ORAL_TABLET | ORAL | 1 refills | Status: AC

## 2024-01-06 MED ORDER — QUETIAPINE FUMARATE 25 MG PO TABS: 25.0000 mg | ORAL_TABLET | Freq: Every day | ORAL | 0 refills | Status: DC

## 2024-01-06 NOTE — Progress Notes (Signed)
 Jamie Gamble [id number] [birth year] 78 y.o.  Subjective:   Patient ID:  Jamie Gamble is a 78 y.o. (DOB [birth year]) female.  Chief Complaint: No chief complaint on file.   HPI Jamie Gamble presents to the office today for follow-up of GAD, insomnia and MDD.  Seeing therapist - Dr. Marijean   Acccompanied by friend - Jamie Gamble.  Describes mood today as about the same. Pleasant. Flat. Denies tearfulness. Mood symptoms - reports depression. Reports lower interest and motivation. Reports anxiety - more about my health. Denies irritability. Denies panic attacks. Reports worry, rumination and over thinking - health related. Reports mood as lower. Stating I feel about the same. Reports taking medications as prescribed.  Energy levels lower. Active, does not have a regular exercise routine. Reports walking to the mailbox once a week. Unable to enjoy some usual interests and activities. Single. Lives alone. No family local. Reports going out to eat once a week with friend Jamie Gamble. Appetite adequate - reports eating 2 to 3 times a day - mostly sandwiches. Weight stable - 174 pounds. Reports sleeping 5 hours a night. Report using CPAP machine nightly. Denies daytime napping. Reports focus and concentration as fair. Reports watching TV throughout the day. Managing minimal aspects of household. Has a Programmer, applications come by once every 6 weeks. Denies SI or HI.   Denies AH or VH. Denies self harm. Denies substance use.   Previous ECT treatment Previous Spravato  treatmet  Previous medication trials: Trazodone , Hydroxyzine , Zoloft, Remeron, Seroquel , Vraylar , Abilify , Wellbutrin .   PHQ2-9    Flowsheet Row Office Visit from 05/12/2023 in [facility] [place] [facility] from 12/25/2022 in [facility] Video Visit from 02/11/2022 in [facility] [place] [facility] from 11/28/2021 in [facility] Office Visit  from 08/13/2021 in [facility] [place]  PHQ-2 Total Score 6 6 0 2 0  PHQ-9 Total Score 19 14 4 7  0   Flowsheet Row ED from 06/04/2021 in [facility]  C-SSRS RISK CATEGORY No Risk     Review of Systems:  Review of Systems  Musculoskeletal:  Negative for gait problem.  Neurological:  Negative for tremors.  Psychiatric/Behavioral:         Please refer to HPI    Medications: I have reviewed the patient's current medications.  Current Outpatient Medications  Medication Sig Dispense Refill   QUEtiapine  (SEROQUEL ) 25 MG tablet Take 1 tablet (25 mg total) by mouth at bedtime. 90 tablet 0   atorvastatin  (LIPITOR) 40 MG tablet TAKE ONE TABLET BY MOUTH AT BEDTIME 90 tablet 1   FLUoxetine  (PROZAC ) 40 MG capsule Take 1 capsule (40 mg total) by mouth daily. 90 capsule 0   Glycerin-Hypromellose-PEG 400 (VISINE DRY EYE OP) Place 1 drop into both eyes daily as needed (dry eyes).     hydrochlorothiazide  (MICROZIDE ) 12.5 MG capsule Take 1 capsule (12.5 mg total) by mouth daily. 90 capsule 1   hydrOXYzine  (ATARAX ) 25 MG tablet Take 1 tablet (25 mg total) by mouth at bedtime. 90 tablet 0   losartan  (COZAAR ) 25 MG tablet TAKE ONE TABLET BY MOUTH DAILY 90 tablet 1   pantoprazole  (PROTONIX ) 40 MG tablet Take 1 tablet (40 mg total) by mouth daily. 90 tablet 1   Probiotic Product (PROBIOTIC DAILY PO) Take 1 capsule by mouth daily.     traZODone  (DESYREL ) 100 MG tablet Take one to two tablets at bedtime  as needed. 180 tablet 1   No current facility-administered medications for this visit.    Medication Side Effects: None  Allergies:  Allergies  Allergen Reactions   Esomeprazole Diarrhea and Other (See Comments)    Past Medical History:  Diagnosis Date   Depression    GERD (gastroesophageal reflux disease)    Hyperlipidemia    Hypertension     Past Medical History, Surgical history, Social history, and Family history were  reviewed and updated as appropriate.   Please see review of systems for further details on the patient's review from today.   Objective:   Physical Exam:  There were no vitals taken for this visit.  Physical Exam Constitutional:      General: She is not in acute distress. Musculoskeletal:        General: No deformity.  Neurological:     Mental Status: She is alert and oriented to person, place, and time.     Coordination: Coordination normal.  Psychiatric:        Attention and Perception: Attention and perception normal. She does not perceive auditory or visual hallucinations.        Mood and Affect: Mood normal. Mood is not anxious or depressed. Affect is not labile, blunt, angry or inappropriate.        Speech: Speech normal.        Behavior: Behavior normal.        Thought Content: Thought content normal. Thought content is not paranoid or delusional. Thought content does not include homicidal or suicidal ideation. Thought content does not include homicidal or suicidal plan.        Cognition and Memory: Cognition and memory normal.        Judgment: Judgment normal.     Comments: Insight intact     Lab Review:     Component Value Date/Time   NA 140 05/12/2023 1619   K 3.8 05/12/2023 1619   CL 102 05/12/2023 1619   CO2 29 05/12/2023 1619   GLUCOSE 102 (H) 05/12/2023 1619   BUN 17 05/12/2023 1619   BUN 15 07/06/2019 0000   CREATININE 0.79 05/12/2023 1619   CALCIUM  9.6 05/12/2023 1619   PROT 6.1 05/12/2023 1619   ALBUMIN 4.0 08/13/2021 0905   AST 21 05/12/2023 1619   ALT 14 05/12/2023 1619   ALKPHOS 48 08/13/2021 0905   BILITOT 0.2 05/12/2023 1619   GFRNONAA >60 06/04/2021 1154       Component Value Date/Time   WBC 7.0 05/12/2023 1619   RBC 4.34 05/12/2023 1619   HGB 13.9 05/12/2023 1619   HCT 40.6 05/12/2023 1619   PLT 284 05/12/2023 1619   MCV 93.5 05/12/2023 1619   MCH 32.0 05/12/2023 1619   MCHC 34.2 05/12/2023 1619   RDW 12.3 05/12/2023 1619    LYMPHSABS 0.7 08/13/2021 0905   MONOABS 0.3 08/13/2021 0905   EOSABS 315 05/12/2023 1619   BASOSABS 49 05/12/2023 1619    No results found for: POCLITH, LITHIUM   No results found for: PHENYTOIN, PHENOBARB, VALPROATE, CBMZ   .res Assessment: Plan:    Plan:  Seeing Jamie Gamble for therapy   PDMP reviewed  Continue:  Prozac  40mg  daily Trazadone 100mg  - take one to two tablets at bedtime for sleep Seroquel  25mg  - 1 at bedtime     Melatonin 5mg  at hs  Followed by Jamie Gamble - therapy  Consider TMS - patient will consider - friend Jamie Gamble offering to help.   RTC 4 weeks  25 minutes spent dedicated to the care of this patient on the date of this encounter to include pre-visit review of records, ordering of medication, post visit documentation, and face-to-face time with the patient discussing MDD, anxiety and insomnia. Discussed continuing current medication regimen.   Discussed alternative treatments - TMS or ECT.   Patient advised to contact office with any questions, adverse effects, or acute worsening in signs and symptoms.  Discussed potential metabolic side effects associated with atypical antipsychotics, as well as potential risk for movement side effects. Advised pt to contact office if movement side effects occur.    Diagnoses and all orders for this visit:  Generalized anxiety disorder -- severe, with sensory hypersensitivity  Recurrent major depression resistant to treatment -     FLUoxetine  (PROZAC ) 40 MG capsule; Take 1 capsule (40 mg total) by mouth daily. -     QUEtiapine  (SEROQUEL ) 25 MG tablet; Take 1 tablet (25 mg total) by mouth at bedtime.  Insomnia, unspecified type -     traZODone  (DESYREL ) 100 MG tablet; Take one to two tablets at bedtime as needed. -     hydrOXYzine  (ATARAX ) 25 MG tablet; Take 1 tablet (25 mg total) by mouth at bedtime.     Please see After Visit Summary for patient specific instructions.  Future Appointments   Date Time Provider Department Center  01/19/2024  1:00 PM Luke Chiquita SAUNDERS, DO [facility] [address]  01/27/2024  2:00 PM Marijean [place], PhD [facility] None  02/03/2024  3:00 PM Ryli Standlee Nattalie, NP [facility] None  02/24/2024  1:00 PM Mitchum, [place], PhD [facility] None    No orders of the defined types were placed in this encounter.   -------------------------------

## 2024-01-07 ENCOUNTER — Ambulatory Visit: Admitting: Adult Health

## 2024-01-19 ENCOUNTER — Ambulatory Visit: Admitting: Family Medicine

## 2024-01-19 DIAGNOSIS — Z0000 Encounter for general adult medical examination without abnormal findings: Secondary | ICD-10-CM

## 2024-01-19 NOTE — Patient Instructions (Addendum)
 I really enjoyed getting to talk with you today! I am available on Tuesdays and Thursdays for virtual visits if you have any questions or concerns, or if I can be of any further assistance.   CHECKLIST FROM ANNUAL WELLNESS VISIT:  -Follow up (please call to schedule if not scheduled after visit):   -yearly for annual wellness visit with primary care office  Here is a list of your preventive care/health maintenance measures and the plan for each if any are due:  PLAN For any measures below that may be due:    1. Can get vaccines at the pharmacy - please let us  know if you do so that we can update your record.  Health Maintenance  Topic Date Due   Zoster Vaccines- Shingrix (1 of 2) Never done   DEXA SCAN  Never done   Influenza Vaccine  10/23/2023   COVID-19 Vaccine (8 - 2025-26 season) 11/23/2023   Medicare Annual Wellness (AWV)  01/01/2024   DTaP/Tdap/Td (2 - Tdap) 06/22/2030   Pneumococcal Vaccine: 50+ Years  Completed   Hepatitis C Screening  Completed   Meningococcal B Vaccine  Aged Out   Mammogram  Discontinued   Colonoscopy  Discontinued    -See a dentist at least yearly  -Get your eyes checked and then per your eye specialist's recommendations  -Other issues addressed today:    1. Try to get outside for 5-10 minutes every day, the morning is best if able.   2. Try to start getting some exercise - even if just starting with 5 minutes of chair exercises or walking per day. See below for further info.   3. Increase social interactions - see below for further info/ideas.   -I have included below further information regarding a healthy whole foods based diet, physical activity guidelines for adults, stress management and opportunities for social connections. I hope you find this information useful.    -----------------------------------------------------------------------------------------------------------------------------------------------------------------------------------------------------------------------------------------------------------    NUTRITION: -eat real food: lots of colorful vegetables (half the plate) and fruits -5-7 servings of vegetables and fruits per day (fresh or steamed is best), exp. 2 servings of vegetables with lunch and dinner and 2 servings of fruit per day. Berries and greens such as kale and collards are great choices.  -consume on a regular basis:  fresh fruits, fresh veggies, fish, nuts, seeds, healthy oils (such as olive oil, avocado oil), whole grains (make sure for bread/pasta/crackers/etc., that the first ingredient on label contains the word whole), legumes. -can eat small amounts of dairy and lean meat (no larger than the palm of your hand), but avoid processed meats such as ham, bacon, lunch meat, etc. -drink water -try to avoid fast food and pre-packaged foods, processed meat, ultra processed foods/beverages (donuts, candy, etc.) -most experts advise limiting sodium to < 2300mg  per day, should limit further is any chronic conditions such as high blood pressure, heart disease, diabetes, etc. The American Heart Association advised that < 1500mg  is is ideal -try to avoid foods/beverages that contain any ingredients with names you do not recognize  -try to avoid foods/beverages  with added sugar or sweeteners/sweets  -try to avoid sweet drinks (including diet drinks): soda, juice, Gatorade, sweet tea, power drinks, diet drinks -try to avoid white rice, white bread, pasta (unless whole grain)  EXERCISE GUIDELINES FOR ADULTS: -if you wish to increase your physical activity, do so gradually and with the approval of your doctor -STOP and seek medical care immediately if you have any chest pain, chest discomfort  or trouble breathing when starting or  increasing exercise  -move and stretch your body, legs, feet and arms when sitting for long periods -Physical activity guidelines for optimal health in adults: -get at least 150 minutes per week of moderate exercise (can talk, but not sing); this is about 20-30 minutes of sustained activity 5-7 days per week or two 10-15 minute episodes of sustained activity 5-7 days per week -do some muscle building/resistance training/strength training at least 2 days per week  -balance exercises 3+ days per week:   Stand somewhere where you have something sturdy to hold onto if you lose balance    1) lift up on toes, then back down, start with 5x per day and work up to 20x   2) stand and lift one leg straight out to the side so that foot is a few inches of the floor, start with 5x each side and work up to 20x each side   3) stand on one foot, start with 5 seconds each side and work up to 20 seconds on each side  If you need ideas or help with getting more active:  -Silver sneakers https://tools.silversneakers.com  -Walk with a Doc: Http://www.duncan-williams.com/  -try to include resistance (weight lifting/strength building) and balance exercises twice per week: or the following link for ideas: http://castillo-powell.com/  buyducts.dk  STRESS MANAGEMENT: -can try meditating, or just sitting quietly with deep breathing while intentionally relaxing all parts of your body for 5 minutes daily -if you need further help with stress, anxiety or depression please follow up with your primary doctor or contact the wonderful folks at [facility]: [phone]  SOCIAL CONNECTIONS: -options in [place] if you wish to engage in more social and exercise related activities:  -Silver sneakers https://tools.silversneakers.com  -Walk with a Doc: Http://www.duncan-williams.com/  -Check out the [facility] Active Adults 50+  section on the [place] [employer] (hiking clubs, book clubs, cards and games, chess, exercise classes, aquatic classes and much more) - see the website for details: https://www.[place].gov/departments/parks-recreation/active-adults50  -YouTube has lots of exercise videos for different ages and abilities as well  -[facility] (a variety of indoor and outdoor inperson activities for adults). [phone]. [address]  -Virtual Online Classes (a variety of topics): see seniorplanet.org or call [phone]  -consider volunteering at a school, hospice center, church, senior center or elsewhere

## 2024-01-19 NOTE — Progress Notes (Signed)
 PATIENT CHECK-IN and HEALTH RISK ASSESSMENT QUESTIONNAIRE:  -completed by phone/video for upcoming Medicare Preventive Visit   Pre-Visit Check-in: 1)Vitals (height, wt, BP, etc) - record in vitals section for visit on day of visit Request home vitals (wt, BP, etc.) and enter into vitals, THEN update Vital Signs SmartPhrase below at the top of the HPI. See below.  2)Review and Update Medications, Allergies PMH, Surgeries, Social history in Epic 3)Hospitalizations in the last year with date/reason? n  4)Review and Update Care Team (patient's specialists) in Epic 5) Complete PHQ9 in Epic  6) Complete Fall Screening in Epic 7)Review all Health Maintenance Due and order if not done.  Medicare Wellness Patient Questionnaire:  Answer theses question about your habits: How often do you have a drink containing alcohol?n How many drinks containing alcohol do you have on a typical day when you are drinking?n How often do you have six or more drinks on one occasion?never Have you ever smoked?y Quit date if applicable? 20 + years ago  How many packs a day do/did you smoke? n Do you use smokeless tobacco?n Do you use an illicit drugs?n On average, how many days per week do you engage in moderate to strenuous exercise (like a brisk walk)?n On average, how many minutes do you engage in exercise at this level?na Typical breakfast: eggs, toast, oatmeal Typical lunch: sandwich Typical dinner: protein and veggies, sometimes orders Typical snacks: none  Beverages: decaf tea Social connections: about once per week  Answer theses question about your everyday activities: Can you perform most household chores?y Are you deaf or have significant trouble hearing?n Do you feel that you have a problem with memory?n Do you feel safe at home?y Last dentist visit?does go often 8. Do you have any difficulty performing your everyday activities?n Are you having any difficulty walking, taking medications on your  own, and or difficulty managing daily home needs?none Do you have difficulty walking or climbing stairs?n Do you have difficulty dressing or bathing?n Do you have difficulty doing errands alone such as visiting a doctor's office or shopping? - does not drive, friend drives Do you currently have any difficulty preparing food and eating?n Do you currently have any difficulty using the toilet?n Do you have any difficulty managing your finances?n Do you have any difficulties with housekeeping of managing your housekeeping?n   Do you have Advanced Directives in place (Living Will, Healthcare Power or Attorney)? HCPOA   Last eye Exam and location?doesn't go often   Do you currently use prescribed or non-prescribed narcotic or opioid pain medications?n  Do you have a history or close family history of breast, ovarian, tubal or peritoneal cancer or a family member with BRCA (breast cancer susceptibility 1 and 2) gene mutations? See pmh/fh      ----------------------------------------------------------------------------------------------------------------------------------------------------------------------------------------------------------------------  Because this visit was a virtual/telehealth visit, some criteria may be missing or patient reported. Any vitals not documented were not able to be obtained and vitals that have been documented are patient reported.    MEDICARE ANNUAL PREVENTIVE VISIT WITH PROVIDER: (Welcome to Medicare, initial annual wellness or annual wellness exam)  Virtual Visit via Video Note  I connected with Jamie Gamble on 01/19/24 by a video enabled telemedicine application and verified that I am speaking with the correct person using two identifiers.  Location patient: home Location provider:work or home office Persons participating in the virtual visit: patient, provider  Concerns and/or follow up today: no concerns. Struggles with chronic depression and  sees psych and  counselor for this - reports is stable.    See HM section in Epic for other details of completed HM.    ROS: negative for report of fevers, unintentional weight loss, vision changes, vision loss, hearing loss or change, chest pain, sob, hemoptysis, melena, hematochezia, hematuria, falls, bleeding or bruising, thoughts of suicide or self harm, memory loss  Patient-completed extensive health risk assessment - reviewed and discussed with the patient: See Health Risk Assessment completed with patient prior to the visit either above or in recent phone note. This was reviewed in detailed with the patient today and appropriate recommendations, orders and referrals were placed as needed per Summary below and patient instructions.   Review of Medical History: -PMH, PSH, Family History and current specialty and care providers reviewed and updated and listed below   Patient Care Team: Theophilus Andrews, Tully GRADE, MD as PCP - General (Internal Medicine)   Past Medical History:  Diagnosis Date   Depression    GERD (gastroesophageal reflux disease)    Hyperlipidemia    Hypertension     Past Surgical History:  Procedure Laterality Date   TONSILLECTOMY      Social History   Socioeconomic History   Marital status: Single    Spouse name: Not on file   Number of children: 0   Years of education: Not on file   Highest education level: Doctorate  Occupational History   Occupation: Retired [place] Employee  Tobacco Use   Smoking status: Former   Smokeless tobacco: Never  Advertising Account Planner   Vaping status: Never Used  Substance and Sexual Activity   Alcohol use: Not Currently   Drug use: Never   Sexual activity: Not on file  Other Topics Concern   Not on file  Social History Narrative   Not on file   Social Drivers of Health   Financial Resource Strain: Low Risk  (05/06/2023)   Overall Financial Resource Strain (CARDIA)    Difficulty of Paying Living Expenses: Not very hard   Food Insecurity: No Food Insecurity (05/06/2023)   Hunger Vital Sign    Worried About Running Out of Food in the Last Year: Never true    Ran Out of Food in the Last Year: Never true  Transportation Needs: Patient Declined (05/06/2023)   PRAPARE - Transportation    Lack of Transportation (Medical): Patient declined    Lack of Transportation (Non-Medical): Patient declined  Physical Activity: Unknown (05/06/2023)   Exercise Vital Sign    Days of Exercise per Week: 0 days    Minutes of Exercise per Session: Not on file  Stress: Stress Concern Present (05/06/2023)   [employer] of Occupational Health - Occupational Stress Questionnaire    Feeling of Stress : To some extent  Social Connections: Unknown (05/06/2023)   Social Connection and Isolation Panel    Frequency of Communication with Friends and Family: Twice a week    Frequency of Social Gatherings with Friends and Family: Once a week    Attends Religious Services: Patient declined    Database Administrator or Organizations: Yes    Attends Engineer, Structural: More than 4 times per year    Marital Status: Never married  Intimate Partner Violence: Unknown (06/28/2021)   Received from [facility]   HITS    Physically Hurt: Not on file    Insult or Talk Down To: Not on file    Threaten Physical Harm: Not on file    Scream or Curse: Not  on file    Family History  Problem Relation Age of Onset   Depression Mother    Cancer Father    Depression Brother     Current Outpatient Medications on File Prior to Visit  Medication Sig Dispense Refill   atorvastatin  (LIPITOR) 40 MG tablet TAKE ONE TABLET BY MOUTH AT BEDTIME 90 tablet 1   FLUoxetine  (PROZAC ) 40 MG capsule Take 1 capsule (40 mg total) by mouth daily. 90 capsule 0   Glycerin-Hypromellose-PEG 400 (VISINE DRY EYE OP) Place 1 drop into both eyes daily as needed (dry eyes).     hydrochlorothiazide  (MICROZIDE ) 12.5 MG capsule Take 1 capsule (12.5 mg total) by mouth  daily. 90 capsule 1   hydrOXYzine  (ATARAX ) 25 MG tablet Take 1 tablet (25 mg total) by mouth at bedtime. 90 tablet 0   losartan  (COZAAR ) 25 MG tablet TAKE ONE TABLET BY MOUTH DAILY 90 tablet 1   pantoprazole  (PROTONIX ) 40 MG tablet Take 1 tablet (40 mg total) by mouth daily. 90 tablet 1   Probiotic Product (PROBIOTIC DAILY PO) Take 1 capsule by mouth daily.     QUEtiapine  (SEROQUEL ) 25 MG tablet Take 1 tablet (25 mg total) by mouth at bedtime. 90 tablet 0   traZODone  (DESYREL ) 100 MG tablet Take one to two tablets at bedtime as needed. 180 tablet 1   No current facility-administered medications on file prior to visit.    Allergies  Allergen Reactions   Esomeprazole Diarrhea and Other (See Comments)       Physical Exam Vitals requested from patient and listed below if patient had equipment and was able to obtain at home for this virtual visit: There were no vitals filed for this visit. Estimated body mass index is 31.35 kg/m as calculated from the following:   Height as of 10/03/21: 5' 2.5 (1.588 m).   Weight as of 05/12/23: 174 lb 3.2 oz (79 kg).  EKG (optional): deferred due to virtual visit  GENERAL: alert, oriented, no acute distress detected, full vision exam deferred due to pandemic and/or virtual encounter  HEENT: atraumatic, conjunttiva clear, no obvious abnormalities on inspection of external nose and ears  NECK: normal movements of the head and neck  LUNGS: on inspection no signs of respiratory distress, breathing rate appears normal, no obvious gross SOB, gasping or wheezing  CV: no obvious cyanosis  MS: moves all visible extremities without noticeable abnormality  PSYCH/NEURO: pleasant and cooperative, flat affect, speech and thought processing grossly intact, Cognitive function grossly intact  Flowsheet Row Clinical Support from 01/19/2024 in [facility]  PHQ-9 Total Score 8        01/19/2024    1:03 PM 05/12/2023    4:47 PM  12/25/2022    3:41 PM 02/11/2022   11:17 AM 11/28/2021    2:23 PM  Depression screen PHQ 2/9  Decreased Interest 1 3  0   Down, Depressed, Hopeless 2 3  0   PHQ - 2 Score 3 6  0   Altered sleeping 0 2  2   Tired, decreased energy 3 3  2    Change in appetite 0 2  0   Feeling bad or failure about yourself  1 3  0   Trouble concentrating 1 2  0   Moving slowly or fidgety/restless 0 1  0   Suicidal thoughts 0 0  0   PHQ-9 Score 8 19  4    Difficult doing work/chores Not difficult at all Somewhat difficult  Not difficult at all      Information is confidential and restricted. Go to Review Flowsheets to unlock data.       02/11/2022   11:17 AM 12/28/2022    9:13 AM 01/01/2023   10:58 AM 05/12/2023    3:17 PM 01/19/2024    1:09 PM  Fall Risk  Falls in the past year? 0 0 0 0 0  Was there an injury with Fall? 0 0 0 0 0  Fall Risk Category Calculator 0 0  0 0 0  Fall Risk Category (Retired) Low       (RETIRED) Patient Fall Risk Level Low fall risk       Patient at Risk for Falls Due to No Fall Risks  Other (Comment)    Fall risk Follow up Falls evaluation completed    Falls evaluation completed Falls evaluation completed;Education provided     Patient-reported   Data saved with a previous flowsheet row definition     SUMMARY AND PLAN:  Encounter for Medicare annual wellness exam   Discussed applicable health maintenance/preventive health measures and advised and referred or ordered per patient preferences: -discussed vaccines due recs and risks, she knows can get at the pharmacy -discussed bone density test - pt declined Health Maintenance  Topic Date Due   Zoster Vaccines- Shingrix (1 of 2) Never done   DEXA SCAN  Never done   Influenza Vaccine  10/23/2023   COVID-19 Vaccine (8 - 2025-26 season) 11/23/2023   Medicare Annual Wellness (AWV)  01/01/2024   DTaP/Tdap/Td (2 - Tdap) 06/22/2030   Pneumococcal Vaccine: 50+ Years  Completed   Hepatitis C Screening  Completed    Meningococcal B Vaccine  Aged Out   Mammogram  Discontinued   Colonoscopy  Discontinued      Education and counseling on the following was provided based on the above review of health and a plan/checklist for the patient, along with additional information discussed, was provided for the patient in the patient instructions :  -she reports depression stable and is seeing psych and getting regular counseling. Discussed tring to get outside for a few minutes + each morning, trying to get some exercise, social interactions and nutrition and their role in supporting mental health.  -Advised and counseled on a healthy lifestyle  -Reviewed patient's current diet. Advised and counseled on a whole foods based healthy diet. A summary of a healthy diet was provided in the Patient Instructions.  -reviewed patient's current physical activity level and discussed exercise guidelines for adults. Discussed community resources and ideas for safe exercise at home to assist in meeting exercise guideline recommendations in a safe and healthy way.  -Advise yearly dental visits at minimum and regular eye exams   Follow up: see patient instructions     Patient Instructions  I really enjoyed getting to talk with you today! I am available on Tuesdays and Thursdays for virtual visits if you have any questions or concerns, or if I can be of any further assistance.   CHECKLIST FROM ANNUAL WELLNESS VISIT:  -Follow up (please call to schedule if not scheduled after visit):   -yearly for annual wellness visit with primary care office  Here is a list of your preventive care/health maintenance measures and the plan for each if any are due:  PLAN For any measures below that may be due:    1. Can get vaccines at the pharmacy - please let us  know if you do so that we can update  your record.  Health Maintenance  Topic Date Due   Zoster Vaccines- Shingrix (1 of 2) Never done   DEXA SCAN  Never done   Influenza  Vaccine  10/23/2023   COVID-19 Vaccine (8 - 2025-26 season) 11/23/2023   Medicare Annual Wellness (AWV)  01/01/2024   DTaP/Tdap/Td (2 - Tdap) 06/22/2030   Pneumococcal Vaccine: 50+ Years  Completed   Hepatitis C Screening  Completed   Meningococcal B Vaccine  Aged Out   Mammogram  Discontinued   Colonoscopy  Discontinued    -See a dentist at least yearly  -Get your eyes checked and then per your eye specialist's recommendations  -Other issues addressed today:    1. Try to get outside for 5-10 minutes every day, the morning is best if able.   2. Try to start getting some exercise - even if just starting with 5 minutes of chair exercises or walking per day. See below for further info.   3. Increase social interactions - see below for further info/ideas.   -I have included below further information regarding a healthy whole foods based diet, physical activity guidelines for adults, stress management and opportunities for social connections. I hope you find this information useful.   -----------------------------------------------------------------------------------------------------------------------------------------------------------------------------------------------------------------------------------------------------------    NUTRITION: -eat real food: lots of colorful vegetables (half the plate) and fruits -5-7 servings of vegetables and fruits per day (fresh or steamed is best), exp. 2 servings of vegetables with lunch and dinner and 2 servings of fruit per day. Berries and greens such as kale and collards are great choices.  -consume on a regular basis:  fresh fruits, fresh veggies, fish, nuts, seeds, healthy oils (such as olive oil, avocado oil), whole grains (make sure for bread/pasta/crackers/etc., that the first ingredient on label contains the word whole), legumes. -can eat small amounts of dairy and lean meat (no larger than the palm of your hand), but avoid  processed meats such as ham, bacon, lunch meat, etc. -drink water -try to avoid fast food and pre-packaged foods, processed meat, ultra processed foods/beverages (donuts, candy, etc.) -most experts advise limiting sodium to < 2300mg  per day, should limit further is any chronic conditions such as high blood pressure, heart disease, diabetes, etc. The American Heart Association advised that < 1500mg  is is ideal -try to avoid foods/beverages that contain any ingredients with names you do not recognize  -try to avoid foods/beverages  with added sugar or sweeteners/sweets  -try to avoid sweet drinks (including diet drinks): soda, juice, Gatorade, sweet tea, power drinks, diet drinks -try to avoid white rice, white bread, pasta (unless whole grain)  EXERCISE GUIDELINES FOR ADULTS: -if you wish to increase your physical activity, do so gradually and with the approval of your doctor -STOP and seek medical care immediately if you have any chest pain, chest discomfort or trouble breathing when starting or increasing exercise  -move and stretch your body, legs, feet and arms when sitting for long periods -Physical activity guidelines for optimal health in adults: -get at least 150 minutes per week of moderate exercise (can talk, but not sing); this is about 20-30 minutes of sustained activity 5-7 days per week or two 10-15 minute episodes of sustained activity 5-7 days per week -do some muscle building/resistance training/strength training at least 2 days per week  -balance exercises 3+ days per week:   Stand somewhere where you have something sturdy to hold onto if you lose balance    1) lift up on toes, then back down, start  with 5x per day and work up to 20x   2) stand and lift one leg straight out to the side so that foot is a few inches of the floor, start with 5x each side and work up to 20x each side   3) stand on one foot, start with 5 seconds each side and work up to 20 seconds on each side  If  you need ideas or help with getting more active:  -Silver sneakers https://tools.silversneakers.com  -Walk with a Doc: Http://www.duncan-williams.com/  -try to include resistance (weight lifting/strength building) and balance exercises twice per week: or the following link for ideas: http://castillo-powell.com/  buyducts.dk  STRESS MANAGEMENT: -can try meditating, or just sitting quietly with deep breathing while intentionally relaxing all parts of your body for 5 minutes daily -if you need further help with stress, anxiety or depression please follow up with your primary doctor or contact the wonderful folks at [facility]: [phone]  SOCIAL CONNECTIONS: -options in [place] if you wish to engage in more social and exercise related activities:  -Silver sneakers https://tools.silversneakers.com  -Walk with a Doc: Http://www.duncan-williams.com/  -Check out the [facility] Active Adults 50+ section on the [place] of [employer] companies (hiking clubs, book clubs, cards and games, chess, exercise classes, aquatic classes and much more) - see the website for details: https://www.[place].gov/departments/parks-recreation/active-adults50  -YouTube has lots of exercise videos for different ages and abilities as well  -[facility] (a variety of indoor and outdoor inperson activities for adults). [phone]. [address].  -Virtual Online Classes (a variety of topics): see seniorplanet.org or call [phone]) [phone]  -consider volunteering at a school, hospice center, church, senior center or elsewhere            Chiquita JONELLE Cramp, DO

## 2024-01-27 ENCOUNTER — Ambulatory Visit (INDEPENDENT_AMBULATORY_CARE_PROVIDER_SITE_OTHER): Admitting: Psychiatry

## 2024-01-27 DIAGNOSIS — Z9189 Other specified personal risk factors, not elsewhere classified: Secondary | ICD-10-CM | POA: Diagnosis not present

## 2024-01-27 DIAGNOSIS — R69 Illness, unspecified: Secondary | ICD-10-CM

## 2024-01-27 DIAGNOSIS — F411 Generalized anxiety disorder: Secondary | ICD-10-CM

## 2024-01-27 DIAGNOSIS — F339 Major depressive disorder, recurrent, unspecified: Secondary | ICD-10-CM | POA: Diagnosis not present

## 2024-01-27 DIAGNOSIS — F40232 Fear of other medical care: Secondary | ICD-10-CM | POA: Diagnosis not present

## 2024-01-27 NOTE — Progress Notes (Signed)
 Psychotherapy Progress Note [facility] [name], PhD LP  Patient ID: KAZARIA GAERTNER [facility])    MRN: [id number] Therapy format: Individual psychotherapy Date: 01/27/2024      Start: 2:04p     Stop: 2:46p     Time Spent: 42 min Location: In-person   Session narrative (presenting needs, interim history, self-report of stressors and symptoms, applications of prior therapy, status changes, and interventions made in session) Here with friend [name].  PHQ-9 scored quite high, just no appetite or suicidality.  Acknowledges learning about TMS from psychiatry but now says she's convinced it will take ECT, she's just scared to step into it.  Discussed at length motivations, reinforcing how it would mean fewer trips to arrange and it is a proven help to her.  Discussed logistics, with Arlean making clear she is not to be relied upon for most rides to treatment or for [employer] when she feels ambivalent about it, but she will wholeheartedly support her doing it.  Reinforced how it was withdrawing from activities and failing to break enough enjoyable ground in new connections that reinforced her withdrawal and entrenching depressive pattern.  Not committal in session about engaging ECT, but determined most likely it means referral back to [facility], most rides would be by ride service, and got no objections to the facts of how it would go if she goes on and consents.  Challenged to decide by the time she sees psychiatry next week, and empowered [name] to be her Retail buyer presence any time she sees fit -- the one who can flatly state it's the best thing to do, it's better on the other side of these feelings, and let's get on with it.  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  Confirmed also that Arlean would be more than ready to represent to her trustee/nephew Bonnie the costs needed for  treatment. Such that it would become no problem to have the trust pay.   Presently, they continue to enjoy weekly visits, habit of going out to lunch together.  Otherwise confirms she is shut in and avoidant of all neighbors, consistently, and not involved in any activities outside the house.  Discussed possibility of renewed home companion service to the effect of stimulating lifestyle and possibly serving ride to ECT.  Therapeutic modalities: Cognitive Behavioral Therapy, Solution-Oriented/Positive Psychology, and directive  Mental Status/Observations:  Appearance:   Casual     Behavior:  Resistant and pleasantly so   Motor:  Stiff gait  Speech/Language:   Clear and Coherent  Affect:  Appropriate  Mood:  depressed  Thought process:  normal  Thought content:    WNL  Sensory/Perceptual disturbances:    WNL  Orientation:  Fully oriented  Attention:  Good    Concentration:  Good  Memory:  WNL  Insight:    Good  Judgment:   Fair  Impulse Control:  Fair   Risk Assessment: Danger to Self: No Self-injurious Behavior: No Danger to Others: No Physical Aggression / Violence: No Duty to Warn: No Access to Firearms a concern: No  Assessment of progress:  stabilized, in need of decisive intervention  Diagnosis:   ICD-10-CM   1. Recurrent major depression resistant to treatment  F33.9     2. Generalized anxiety disorder -- severe, with sensory hypersensitivity  F41.1     3. social anxiety -- r/o byproduct of depression vs. SAD vs.  Avoidant Personality  R69     4. Fear of other medical care, particularly ECT  F40.232     5. At increased risk for social isolation  Z91.89      Plan:  Amended terms of service -- Agree to slow down therapy contact at this point, given the absence of urgency or motivation to change.  Consent for communication with friend Arlean is now permission to receive info, check with Pt before disclosure.  Other aspects of plan below apply as ongoing recommendations  and inventory of important feedback to date. Antidepression priorities --  Continue to consider ECT available as a proven method to re-motivate her and break obsessions, insofar as she needs.  Defer to Pt's discretion if needed, or to her ability to collect herself if it is too scary a prospect. Reframe trying and worrying -- Notice things she does that she labels as for distraction and reframe them as just ways of spending time, with no reference to what she is trying not to think about.  Practice letting it just be another piece of time, making do rather than fighting internal states, which are inherently viewed as foreign and overwhelming. Find variety in shut in experience -- If reachable, vary TV viewing, seek out things that are either beautiful or learn something (e.g., an [place] nature documentary), de-emphasize judgment whether it's working and just see about taking it in, succeeding in variety. Request that some socializing with [name] be done inside, for the sake of changing how her home feels Make a daily practice of seeing some sunlight in the morning Try to get out for something, not just have it all delivered Try to allow a neighbor to interact briefly, and say so if she'd like to get back home, trust it's not rude but honest Notice and dispute I have to, but I can't thinking while wrestling with tasks -- either determine I don't have to after all, or I actually can do a little something Prayer tone -- Asking God to take away depression is honest, but asking help coping, strengthening, and noticing the good among the bad is more constructive Notice any difference these efforts make, however small Behavioral/social activation -- advise be in touch with [name], try to contact Toastmaster's friends, and entertain asking someone else to come over or get out with them somehow; vary spending social time out in the world and within her home, so home does not become so  conditioned to retreat and perceived safety from social anxiety. Sleep regulation -- Should continue orange glasses QPM to facilitate circadian signaling, sleep readiness, and sleep hardiness.  Best recommendation dosing sleep-assisting meds up to 1 hr before bed to help her move through initial insomnia and insomnia anxiety.  Option add melatonin 2-5mg  if needed 1-2 hr ahead of bed.  Practice sleep hygiene, including comfortable arrangements, reduced lighting, and all caffeine stopped well ahead of bedtime (at least 6 hrs).  Use soothing imagery PRN for DFA.  Should adhere to CPAP and ensure proper maintenance.  For worry control, option to externalize worry thoughts by writing them down and then consciously decide whether to stay up and think -- out of bed -- or park them and sleep, with further option to offer them to God's care overnight, consistent with her faith.  Acknowledge and table any worry about whether she will sleep -- trust the natural sleep drive to come around, and if too wakeful, get out of bed for anything not too stimulating.  Trust  variations in sleep experience, e.g., an uptick in dreams, as natural progression of catching up deficits and processing emotional life issues.  Medication options may include alpha blocker to turn down the volume on autonomic arousal, signal it safer to sleep.   Physiological mood factors -- Start the day with good lighting, movement/stretching, and a few minutes grounding, outdoors if available/desired.  Vitamin levels are normal, but improved B12 and D could still be helpful.  Should get some moderate exercise as able, even just walking and stretching, to combat depressogenic inactivity effects.  Continue supplements -- NAC regularly in the morning, Mg L-theonate regularly in the evening. Self-esteem and intrusive guilt/shame thoughts -- PRN practices of blessing-counting and listing ways people have told/shown her she is lovable/capable.  No requirement to  believe or agree, just consider.  Seek to notice and give self credit when she does anything constructive or principled for things that matter to her wellbeing or sense of purpose and for any efforts that are better than full avoidance.  As needed, self-affirm God would not be punishing her, it's a trick of depression to think it. Activities and socialization -- Continue to strongly recommend at least minimal outings to break rumination and stimulate her emotional and behavioral systems.  As able, develop activities of interest.  Self-monitor for broad demotivation and reach out.  Known options in friend [name] [place], [place], and church activities at 3 locations, ranging from chair yoga to entertainment/fellowship group to more theological and philosophical study.  Maintain/restore system of friends beyond [place], which may include anyone available at housing community.  Dispute worry thoughts of being judged, and enter interpersonal space with permission to feel not so up to it, either.  [employer] can be available for supportive calls, assistance services, and possible facility-based recreation.  Home care companions when needed.  Self-monitor for impulse to more fully isolate, and in all involvements practice permission to be authentic about her tolerance for spending time in view or the presence of others.  OK with employment if regains interest. Placement concerns -- Independent living with PRN assistance should be fine for the foreseeable future, though she could use the interpersonal stimulus of more regular contact inside or outside the confines of her home.  Available on request and consent to interpret her condition empowered persons among her family and friends.  May consider congregate living if it proves too difficult to motivate to ECT or to pull together and reclaim her lifestyle. Specialized therapies and medical procedure anxiety -- ECT recommended based on successful experience, but  resisted.  Despite cognitive recovery after ECT, fear of medical procedures and fragility in body or mind are persistent and interfere with acceptance and decision-making; need to adopt willingness to take some kind of risk.  Spravato  tried and unable to abide due to anxiety about doing it right and having dissociative chemistry working on her.  TMS broached and rejected.  Where reckoning with fear, urged to ask all necessary questions of health care, make sure to adequately comprehend recommendations rather than catastrophize without knowing.  For any procedure accepted, imagine the competence and caring of health care personnel in action, especially if it involves anesthesia and other vulnerable-making procedures. Weight management -- Concur with modified Weight Watchers approach and improved food choices as motivated.  Encourage to stay mobile, watch portions, limit carbs, try the practice of leaving a bite, and phase in foods from the MIND Diet. Financial concerns -- As needed, follow through fact-finding and meeting with  an oncologist, in coordination with friend/POA [name].  Still OK to seek part-time work she can reach from her home if interested.  Maintain cordial working relationship with nephew/trustee Bonnie. Safety & welfare plan -- Continue pledge of no harm, see one or the other mental health provider at least 2/month until returning to normal activity outside the home.  Alyse [name] remains POA and cleared to confer in emergencies but not yet for planning interventions on her behalf.  Recommend all treatment team members stand willing to contact [name] if any indications of compromised safety, or poor comprehension or judgment. Coordination -- Regular conference with psychiatry Other recommendations/advice -- As may be noted above.  Continue to utilize previously learned skills ad lib. Medication compliance -- Maintain medication as prescribed and work faithfully with relevant prescriber(s) if any changes  are desired or seem indicated. Crisis service -- Aware of call list and work-in appts.  Call the clinic on-call service, 988/hotline, 911, or present to [facility] or ER if any life-threatening psychiatric crisis. Followup -- Return for time as already scheduled.  Next scheduled visit with me 02/24/2024.  Next scheduled in this office 02/03/2024.  Lamar Kendall, PhD [name], PhD LP Clinical Psychologist, [facility] [address] [place], [place] [address] [phone]

## 2024-02-03 ENCOUNTER — Encounter: Payer: Self-pay | Admitting: Adult Health

## 2024-02-03 ENCOUNTER — Telehealth: Admitting: Adult Health

## 2024-02-03 DIAGNOSIS — F411 Generalized anxiety disorder: Secondary | ICD-10-CM | POA: Diagnosis not present

## 2024-02-03 DIAGNOSIS — F339 Major depressive disorder, recurrent, unspecified: Secondary | ICD-10-CM | POA: Diagnosis not present

## 2024-02-03 DIAGNOSIS — G4700 Insomnia, unspecified: Secondary | ICD-10-CM | POA: Diagnosis not present

## 2024-02-03 NOTE — Progress Notes (Signed)
 Jamie Gamble [id number] 1946-02-20 78 y.o.  Virtual Visit via Video Note  I connected with pt @ on 02/03/24 at  3:00 PM EST by a video enabled telemedicine application and verified that I am speaking with the correct person using two identifiers.   I discussed the limitations of evaluation and management by telemedicine and the availability of in person appointments. The patient expressed understanding and agreed to proceed.  I discussed the assessment and treatment plan with the patient. The patient was provided an opportunity to ask questions and all were answered. The patient agreed with the plan and demonstrated an understanding of the instructions.   The patient was advised to call back or seek an in-person evaluation if the symptoms worsen or if the condition fails to improve as anticipated.  I provided 15 minutes of non-face-to-face time during this encounter.  The patient was located at home.  The provider was located at [facility].   Angeline LOISE Sayers, NP   Subjective:   Patient ID:  Jamie Gamble is a 78 y.o. (DOB [birth year]) female.  Chief Complaint: No chief complaint on file.   HPI Jamie Gamble presents for follow-up of GAD, insomnia and MDD.  Seeing therapist - Dr. Marijean   Describes mood today as not any better. Pleasant. Flat. Denies tearfulness. Mood symptoms - reports depression. Reports lower interest and motivation. Reports anxiety - I'm worried about my health. Denies irritability. Denies panic attacks. Reports worry, rumination and over thinking - health issues. Reports mood as lower. Stating I'm not feeling any better. Reports taking medications as prescribed. Reports she is considering another trial of ECT, but is scared and uncertain if she can push herself to go through with treatment. Energy levels lower - little activity. Active, does not have a regular exercise routine.  Unable to enjoy some usual interests and activities. Single. Lives  alone. No family local. Reports going out to eat once a week with friend [name]. Appetite adequate - reports eating 2 to 3 times a day - mostly sandwiches. Weight stable - 174 pounds. Reports sleeping 5 to 6 hours a night. Report using CPAP machine nightly. Denies daytime napping. Reports focus and concentration as alright. Managing minimal aspects of household - just a little. Has a programmer, applications come by once every 6 weeks. Retired 10 years ago from [employer]. Denies SI or HI.   Denies AH or VH. Denies self harm. Denies substance use.   Previous ECT treatment Previous Spravato  treatmet  Previous medication trials: Trazodone , Hydroxyzine , Zoloft, Remeron, Seroquel , Vraylar , Abilify , Wellbutrin .   Review of Systems:  Review of Systems  Musculoskeletal:  Negative for gait problem.  Neurological:  Negative for tremors.  Psychiatric/Behavioral:         Please refer to HPI    Medications: I have reviewed the patient's current medications.  Current Outpatient Medications  Medication Sig Dispense Refill   atorvastatin  (LIPITOR) 40 MG tablet TAKE ONE TABLET BY MOUTH AT BEDTIME 90 tablet 1   FLUoxetine  (PROZAC ) 40 MG capsule Take 1 capsule (40 mg total) by mouth daily. 90 capsule 0   Glycerin-Hypromellose-PEG 400 (VISINE DRY EYE OP) Place 1 drop into both eyes daily as needed (dry eyes).     hydrochlorothiazide  (MICROZIDE ) 12.5 MG capsule Take 1 capsule (12.5 mg total) by mouth daily. 90 capsule 1   hydrOXYzine  (ATARAX ) 25 MG tablet Take 1 tablet (25 mg total) by mouth at bedtime. 90 tablet 0   losartan  (COZAAR ) 25 MG tablet  TAKE ONE TABLET BY MOUTH DAILY 90 tablet 1   pantoprazole  (PROTONIX ) 40 MG tablet Take 1 tablet (40 mg total) by mouth daily. 90 tablet 1   Probiotic Product (PROBIOTIC DAILY PO) Take 1 capsule by mouth daily.     QUEtiapine  (SEROQUEL ) 25 MG tablet Take 1 tablet (25 mg total) by mouth at bedtime. 90 tablet 0   traZODone  (DESYREL ) 100 MG tablet Take one to two tablets at  bedtime as needed. 180 tablet 1   No current facility-administered medications for this visit.    Medication Side Effects: None  Allergies:  Allergies  Allergen Reactions   Esomeprazole Diarrhea and Other (See Comments)    Past Medical History:  Diagnosis Date   Depression    GERD (gastroesophageal reflux disease)    Hyperlipidemia    Hypertension     Family History  Problem Relation Age of Onset   Depression Mother    Cancer Father    Depression Brother     Social History   Socioeconomic History   Marital status: Single    Spouse name: Not on file   Number of children: 0   Years of education: Not on file   Highest education level: Doctorate  Occupational History   Occupation: Retired [employer] Employee  Tobacco Use   Smoking status: Former   Smokeless tobacco: Never  Advertising Account Planner   Vaping status: Never Used  Substance and Sexual Activity   Alcohol use: Not Currently   Drug use: Never   Sexual activity: Not on file  Other Topics Concern   Not on file  Social History Narrative   Not on file   Social Drivers of Health   Financial Resource Strain: Low Risk  (05/06/2023)   Overall Financial Resource Strain (CARDIA)    Difficulty of Paying Living Expenses: Not very hard  Food Insecurity: No Food Insecurity (05/06/2023)   Hunger Vital Sign    Worried About Running Out of Food in the Last Year: Never true    Ran Out of Food in the Last Year: Never true  Transportation Needs: Patient Declined (05/06/2023)   PRAPARE - Transportation    Lack of Transportation (Medical): Patient declined    Lack of Transportation (Non-Medical): Patient declined  Physical Activity: Unknown (05/06/2023)   Exercise Vital Sign    Days of Exercise per Week: 0 days    Minutes of Exercise per Session: Not on file  Stress: Stress Concern Present (05/06/2023)   [employer] of Occupational Health - Occupational Stress Questionnaire    Feeling of Stress : To some extent  Social  Connections: Unknown (05/06/2023)   Social Connection and Isolation Panel    Frequency of Communication with Friends and Family: Twice a week    Frequency of Social Gatherings with Friends and Family: Once a week    Attends Religious Services: Patient declined    Database Administrator or Organizations: Yes    Attends Engineer, Structural: More than 4 times per year    Marital Status: Never married  Intimate Partner Violence: Unknown (06/28/2021)   Received from [facility]   HITS    Physically Hurt: Not on file    Insult or Talk Down To: Not on file    Threaten Physical Harm: Not on file    Scream or Curse: Not on file    Past Medical History, Surgical history, Social history, and Family history were reviewed and updated as appropriate.   Please see review of systems  for further details on the patient's review from today.   Objective:   Physical Exam:  There were no vitals taken for this visit.  Physical Exam Constitutional:      General: She is not in acute distress. Musculoskeletal:        General: No deformity.  Neurological:     Mental Status: She is alert and oriented to person, place, and time.     Coordination: Coordination normal.  Psychiatric:        Attention and Perception: Attention and perception normal. She does not perceive auditory or visual hallucinations.        Mood and Affect: Mood normal. Mood is not anxious or depressed. Affect is not labile, blunt, angry or inappropriate.        Speech: Speech normal.        Behavior: Behavior normal.        Thought Content: Thought content normal. Thought content is not paranoid or delusional. Thought content does not include homicidal or suicidal ideation. Thought content does not include homicidal or suicidal plan.        Cognition and Memory: Cognition and memory normal.        Judgment: Judgment normal.     Comments: Insight intact     Lab Review:     Component Value Date/Time   NA 140 05/12/2023  1619   K 3.8 05/12/2023 1619   CL 102 05/12/2023 1619   CO2 29 05/12/2023 1619   GLUCOSE 102 (H) 05/12/2023 1619   BUN 17 05/12/2023 1619   BUN 15 07/06/2019 0000   CREATININE 0.79 05/12/2023 1619   CALCIUM  9.6 05/12/2023 1619   PROT 6.1 05/12/2023 1619   ALBUMIN 4.0 08/13/2021 0905   AST 21 05/12/2023 1619   ALT 14 05/12/2023 1619   ALKPHOS 48 08/13/2021 0905   BILITOT 0.2 05/12/2023 1619   GFRNONAA >60 06/04/2021 1154       Component Value Date/Time   WBC 7.0 05/12/2023 1619   RBC 4.34 05/12/2023 1619   HGB 13.9 05/12/2023 1619   HCT 40.6 05/12/2023 1619   PLT 284 05/12/2023 1619   MCV 93.5 05/12/2023 1619   MCH 32.0 05/12/2023 1619   MCHC 34.2 05/12/2023 1619   RDW 12.3 05/12/2023 1619   LYMPHSABS 0.7 08/13/2021 0905   MONOABS 0.3 08/13/2021 0905   EOSABS 315 05/12/2023 1619   BASOSABS 49 05/12/2023 1619    No results found for: POCLITH, LITHIUM   No results found for: PHENYTOIN, PHENOBARB, VALPROATE, CBMZ   .res Assessment: Plan:    Plan:  Seeing [name] for therapy   PDMP reviewed  Continue:  Prozac  40mg  daily Trazadone 100mg  - take one to two tablets at bedtime for sleep Seroquel  25mg  - 1 at bedtime     Melatonin 5mg  at hs  Followed by [name] - therapy  Consider TMS - patient will consider - friend [name] offering to help.   RTC 4 weeks   15 minutes spent dedicated to the care of this patient on the date of this encounter to include pre-visit review of records, ordering of medication, post visit documentation, and face-to-face time with the patient discussing MDD, anxiety and insomnia. Discussed continuing current medication regimen.   Discussed alternative treatments - TMS or ECT.   Patient advised to contact office with any questions, adverse effects, or acute worsening in signs and symptoms.  Discussed potential metabolic side effects associated with atypical antipsychotics, as well as potential risk for movement side  effects. Advised pt to contact office if movement side effects occur.   There are no diagnoses linked to this encounter.   Please see After Visit Summary for patient specific instructions.  Future Appointments  Date Time Provider Department Center  02/24/2024  1:00 PM Marijean [place], PhD CP-CP None    No orders of the defined types were placed in this encounter.     -------------------------------

## 2024-02-11 DIAGNOSIS — G4733 Obstructive sleep apnea (adult) (pediatric): Secondary | ICD-10-CM | POA: Diagnosis not present

## 2024-02-24 ENCOUNTER — Ambulatory Visit: Admitting: Psychiatry

## 2024-02-24 DIAGNOSIS — F40232 Fear of other medical care: Secondary | ICD-10-CM | POA: Diagnosis not present

## 2024-02-24 DIAGNOSIS — Z9189 Other specified personal risk factors, not elsewhere classified: Secondary | ICD-10-CM

## 2024-02-24 DIAGNOSIS — F5104 Psychophysiologic insomnia: Secondary | ICD-10-CM | POA: Diagnosis not present

## 2024-02-24 DIAGNOSIS — F411 Generalized anxiety disorder: Secondary | ICD-10-CM | POA: Diagnosis not present

## 2024-02-24 DIAGNOSIS — F339 Major depressive disorder, recurrent, unspecified: Secondary | ICD-10-CM | POA: Diagnosis not present

## 2024-02-24 NOTE — Progress Notes (Unsigned)
 Psychotherapy Progress Note [facility] [name], PhD LP  Patient ID: CAMALA TALWAR [facility])    MRN: [id number] Therapy format: Individual psychotherapy Date: 02/24/2024      Start: 1:10p     Stop: 1:50p     Time Spent: 40 min (including non-contact communication and research time on behalf of Pt Location: Telehealth visit -- I connected with this patient by an approved telecommunication method (video), with her informed consent, and verifying identity and patient privacy.  I was located at my office and patient at her home.  As needed, we discussed the limitations, risks, and security and privacy concerns associated with telehealth service, including the availability and conditions which currently govern in-person appointments and the possibility that 3rd-party payment may not be fully guaranteed and she may be responsible for charges.  After she indicated understanding, we proceeded with the session.  Also discussed treatment planning, as needed, including ongoing verbal agreement with the plan, the opportunity to ask and answer all questions, her demonstrated understanding of instructions, and her readiness to call the office should symptoms worsen or she feels she is in a crisis state and needs more immediate and tangible assistance.   Session narrative (presenting needs, interim history, self-report of stressors and symptoms, applications of prior therapy, status changes, and interventions made in session) Still believes she needs ECT, still reluctant to commit.  Still depressed but decidedly not suicidal.  As for lifestyle, has gotten out to eat with Arlean about 1/wk.  Is getting out to get mail 1-2 per week, in some measure braving running into others.  Says friends from [employer] and [employer] have still been inquiring about her, taking their calls, and declining offers of face time.  Still able to get all the supplies she needs, via deliveries.  No family plans, no tradition  of seeing or video or calling relatives, even her trustee and nephew, Bonnie.  No interests in puzzles or other pastimes.    Discussed ECT plan, still wary of going to [facility], says it frightened her too much before.  Reminded that she improved after she went, and obviously had a successful experience digging down and trusting it, but still feels she would be too hard-pressed to do it again.  Same if medical transportation could be secured instead of unknown [employer] drivers.  Says instead she could see going to [facility] for it, presumably because it is both unseen and reputable.  Offers it would most likely be as an inpatient, given the distance.  Endorsed the idea as plausible, though it would require preauthorization from her insurance on grounds of outpatient treatment failure, including Spravato , and inordinate anxiety with available outpatient option.  Recruited her commitment to see it through if psychiatry team is able to work it out for her to go.  Conferred with psychiatrist after finished with Pt, about 1:35p, encompassing about 15 min between Dr. Mozingo and house nurse, who has experience with precertifications.  Dr. Mozingo will see next week and agrees to pursue the direct referral for admission.  Medically, CPAP is working as it's supposed to, and says she is keeping up with maintenance.  Subjectively, agrees maybe a little improvement vs sleep before CPAP.  Continues to have some vivid dreams and some midnight awakening, will come sit in living room until able to sleep again.  Probed her interest in possible physical therapy, having noted PT -- none.  Recommended again walking and stretching, for wellbeing signals to the emotional brain.  Therapeutic modalities: Cognitive Behavioral Therapy and Solution-Oriented/Positive Psychology  Mental Status/Observations:  Appearance:   Casual     Behavior:  Appropriate and Resistant  Motor:  Normal  Speech/Language:   Clear and Coherent  Affect:   Appropriate  Mood:  depressed  Thought process:  normal  Thought content:    Obsessions  Sensory/Perceptual disturbances:    WNL  Orientation:  Fully oriented  Attention:  Good    Concentration:  Good  Memory:  grossly intact  Insight:    Fair  Judgment:   Good  Impulse Control:  Good   Risk Assessment: Danger to Self: No Self-injurious Behavior: No Danger to Others: No Physical Aggression / Violence: No Duty to Warn: No Access to Firearms a concern: No  Assessment of progress:  stabilized  Diagnosis:   ICD-10-CM   1. Recurrent major depression resistant to treatment  F33.9     2. Generalized anxiety disorder -- severe, with sensory hypersensitivity  F41.1     3. Fear of other medical care, particularly ECT  F40.232     4. At increased risk for social isolation  Z91.89     5. Psychophysiological insomnia  F51.04      Plan:  Amended terms of service -- Agree to slow down therapy contact at this point, given the absence of urgency or motivation to change.  Consent for communication with friend Arlean is now permission to receive info, check with Pt before disclosure.  Other aspects of plan below apply as ongoing recommendations and inventory of important feedback to date. Antidepression priorities --  Continue to consider ECT available as a proven method to re-motivate her and break obsessions, insofar as she needs.  Defer to Pt's discretion if needed, or to her ability to collect herself if it is too scary a prospect. Reframe trying and worrying -- Notice things she does that she labels as for distraction and reframe them as just ways of spending time, with no reference to what she is trying not to think about.  Practice letting it just be another piece of time, making do rather than fighting internal states, which are inherently viewed as foreign and overwhelming. Find variety in shut in experience -- If reachable, vary TV viewing, seek out things that are either  beautiful or learn something (e.g., an [place] nature documentary), de-emphasize judgment whether it's working and just see about taking it in, succeeding in variety. Request that some socializing with [name] be done inside, for the sake of changing how her home feels Make a daily practice of seeing some sunlight in the morning Try to get out for something, not just have it all delivered Try to allow a neighbor to interact briefly, and say so if she'd like to get back home, trust it's not rude but honest Notice and dispute I have to, but I can't thinking while wrestling with tasks -- either determine I don't have to after all, or I actually can do a little something Prayer tone -- Asking God to take away depression is honest, but asking help coping, strengthening, and noticing the good among the bad is more constructive Notice any difference these efforts make, however small Behavioral/social activation -- advise be in touch with [name], try to contact [employer] friends, and entertain asking someone else to come over or get out with them somehow; vary spending social time out in the world and within her home, so home does not become so conditioned to retreat and perceived safety from social  anxiety. Sleep regulation -- Should continue orange glasses QPM to facilitate circadian signaling, sleep readiness, and sleep hardiness.  Best recommendation dosing sleep-assisting meds up to 1 hr before bed to help her move through initial insomnia and insomnia anxiety.  Option add melatonin 2-5mg  if needed 1-2 hr ahead of bed.  Practice sleep hygiene, including comfortable arrangements, reduced lighting, and all caffeine stopped well ahead of bedtime (at least 6 hrs).  Use soothing imagery PRN for DFA.  Should adhere to CPAP and ensure proper maintenance.  For worry control, option to externalize worry thoughts by writing them down and then consciously decide whether to stay up and think -- out of bed --  or park them and sleep, with further option to offer them to God's care overnight, consistent with her faith.  Acknowledge and table any worry about whether she will sleep -- trust the natural sleep drive to come around, and if too wakeful, get out of bed for anything not too stimulating.  Trust variations in sleep experience, e.g., an uptick in dreams, as natural progression of catching up deficits and processing emotional life issues.  Medication options may include alpha blocker to turn down the volume on autonomic arousal, signal it safer to sleep.   Physiological mood factors -- Start the day with good lighting, movement/stretching, and a few minutes grounding, outdoors if available/desired.  Vitamin levels are normal, but improved B12 and D could still be helpful.  Should get some moderate exercise as able, even just walking and stretching, to combat depressogenic inactivity effects.  Continue supplements -- NAC regularly in the morning, Mg L-theonate regularly in the evening. Self-esteem and intrusive guilt/shame thoughts -- PRN practices of blessing-counting and listing ways people have told/shown her she is lovable/capable.  No requirement to believe or agree, just consider.  Seek to notice and give self credit when she does anything constructive or principled for things that matter to her wellbeing or sense of purpose and for any efforts that are better than full avoidance.  As needed, self-affirm God would not be punishing her, it's a trick of depression to think it. Activities and socialization -- Continue to strongly recommend at least minimal outings to break rumination and stimulate her emotional and behavioral systems.  As able, develop activities of interest.  Self-monitor for broad demotivation and reach out.  Known options in friend [name] [employer], [employer], and church activities at 3 locations, ranging from chair yoga to entertainment/fellowship group to more theological and philosophical  study.  Maintain/restore system of friends beyond [place], which may include anyone available at housing community.  Dispute worry thoughts of being judged, and enter interpersonal space with permission to feel not so up to it, either.  [employer] can be available for supportive calls, assistance services, and possible facility-based recreation.  Home care companions when needed.  Self-monitor for impulse to more fully isolate, and in all involvements practice permission to be authentic about her tolerance for spending time in view or the presence of others.  OK with employment if regains interest. Placement concerns -- Independent living with PRN assistance should be fine for the foreseeable future, though she could use the interpersonal stimulus of more regular contact inside or outside the confines of her home.  Available on request and consent to interpret her condition empowered persons among her family and friends.  May consider congregate living if it proves too difficult to motivate to ECT or to pull together and reclaim her lifestyle. Specialized therapies and medical  procedure anxiety -- ECT recommended based on successful experience, but resisted.  Despite cognitive recovery after ECT, fear of medical procedures and fragility in body or mind are persistent and interfere with acceptance and decision-making; need to adopt willingness to take some kind of risk.  Spravato  tried and unable to abide due to anxiety about doing it right and having dissociative chemistry working on her.  TMS broached and rejected.  Where reckoning with fear, urged to ask all necessary questions of health care, make sure to adequately comprehend recommendations rather than catastrophize without knowing.  For any procedure accepted, imagine the competence and caring of health care personnel in action, especially if it involves anesthesia and other vulnerable-making procedures. Weight management -- Concur with modified Weight  Watchers approach and improved food choices as motivated.  Encourage to stay mobile, watch portions, limit carbs, try the practice of leaving a bite, and phase in foods from the MIND Diet. Financial concerns -- As needed, follow through fact-finding and meeting with an advisor, in coordination with friend/POA [name].  Still OK to seek part-time work she can reach from her home if interested.  Maintain cordial working relationship with nephew/trustee Bonnie. Safety & welfare plan -- Continue pledge of no harm, see one or the other mental health provider at least 2/month until returning to normal activity outside the home.  Alyse [name] remains POA and cleared to confer in emergencies but not yet for planning interventions on her behalf.  Recommend all treatment team members stand willing to contact [name] if any indications of compromised safety, or poor comprehension or judgment. Coordination -- Regular conference with psychiatry Other recommendations/advice -- As may be noted above.  Continue to utilize previously learned skills ad lib. Medication compliance -- Maintain medication as prescribed and work faithfully with relevant prescriber(s) if any changes are desired or seem indicated. Crisis service -- Aware of call list and work-in appts.  Call the clinic on-call service, 988/hotline, 911, or present to [facility] or ER if any life-threatening psychiatric crisis. Followup -- Return in about 1 month (around 03/26/2024).  Next scheduled visit with me Visit date not found.  Next scheduled in this office 03/01/2024.  Lamar Kendall, PhD [name], PhD LP Clinical Psychologist, [facility] [address] [place], [place] [address] [phone]

## 2024-03-01 ENCOUNTER — Encounter: Payer: Self-pay | Admitting: Adult Health

## 2024-03-01 ENCOUNTER — Telehealth: Admitting: Adult Health

## 2024-03-01 ENCOUNTER — Telehealth: Payer: Self-pay

## 2024-03-01 DIAGNOSIS — G4700 Insomnia, unspecified: Secondary | ICD-10-CM

## 2024-03-01 DIAGNOSIS — F339 Major depressive disorder, recurrent, unspecified: Secondary | ICD-10-CM | POA: Diagnosis not present

## 2024-03-01 DIAGNOSIS — F411 Generalized anxiety disorder: Secondary | ICD-10-CM

## 2024-03-01 NOTE — Telephone Encounter (Signed)
 Jamie Gamble is requesting a referral be made to [facility] for ECT. She has been to [facility] before and doesn't want to go back there. Jamie Gamble said she will need to be inpatient.

## 2024-03-01 NOTE — Progress Notes (Signed)
 Jamie Gamble [id number] October 16, 1945 78 y.o.  Virtual Visit via Video Note  I connected with pt @ on 03/01/24 at  3:00 PM EST by a video enabled telemedicine application and verified that I am speaking with the correct person using two identifiers.   I discussed the limitations of evaluation and management by telemedicine and the availability of in person appointments. The patient expressed understanding and agreed to proceed.  I discussed the assessment and treatment plan with the patient. The patient was provided an opportunity to ask questions and all were answered. The patient agreed with the plan and demonstrated an understanding of the instructions.   The patient was advised to call back or seek an in-person evaluation if the symptoms worsen or if the condition fails to improve as anticipated.  I provided 20 minutes of non-face-to-face time during this encounter.  The patient was located at home.  The provider was located at [facility].   Angeline LOISE Sayers, NP   Subjective:   Patient ID:  Jamie Gamble is a 78 y.o. (DOB 1945-12-12) female.  Chief Complaint: No chief complaint on file.   HPI Jamie Gamble presents for follow-up of GAD, insomnia and MDD.  Seeing therapist - Dr. Marijean   Describes mood today as ok. Pleasant. Flat. Denies tearfulness. Mood symptoms - reports depression. Reports lower interest and motivation. Reports anxiety - about my health. Denies irritability. Denies panic attacks. Reports worry, rumination and over thinking - just my health. Reports mood as lower. Stating I'm not doing good. Reports taking medications as prescribed. Reports she is considering another trial of ECT - would prefer to be seen at [facility]. Energy levels lower. Active, does not have a regular exercise routine.  Unable to enjoy some usual interests and activities. Single. Lives alone. No family local. Reports going out to eat once a week with friend - [name]. Appetite adequate  - reports eating 2 to 3 times a day. Weight stable - 174 pounds. Reports sleeping 5 to 6 hours a night. Report using CPAP machine nightly. Denies daytime napping. Reports focus and concentration as it's fair. Managing minimal aspects of household - some basic stuff. Has a programmer, applications come by once every 6 weeks. Retired 10 years ago from [employer]. Denies SI or HI.   Denies AH or VH. Denies self harm. Denies substance use.   Previous ECT treatment Previous Spravato  treatmet  Previous medication trials: Trazodone , Hydroxyzine , Zoloft, Remeron, Seroquel , Vraylar , Abilify , Wellbutrin .  Review of Systems:  Review of Systems  Musculoskeletal:  Negative for gait problem.  Neurological:  Negative for tremors.  Psychiatric/Behavioral:         Please refer to HPI    Medications: I have reviewed the patient's current medications.  Current Outpatient Medications  Medication Sig Dispense Refill   atorvastatin  (LIPITOR) 40 MG tablet TAKE ONE TABLET BY MOUTH AT BEDTIME 90 tablet 1   FLUoxetine  (PROZAC ) 40 MG capsule Take 1 capsule (40 mg total) by mouth daily. 90 capsule 0   Glycerin-Hypromellose-PEG 400 (VISINE DRY EYE OP) Place 1 drop into both eyes daily as needed (dry eyes).     hydrochlorothiazide  (MICROZIDE ) 12.5 MG capsule Take 1 capsule (12.5 mg total) by mouth daily. 90 capsule 1   hydrOXYzine  (ATARAX ) 25 MG tablet Take 1 tablet (25 mg total) by mouth at bedtime. 90 tablet 0   losartan  (COZAAR ) 25 MG tablet TAKE ONE TABLET BY MOUTH DAILY 90 tablet 1   pantoprazole  (PROTONIX ) 40 MG tablet  Take 1 tablet (40 mg total) by mouth daily. 90 tablet 1   Probiotic Product (PROBIOTIC DAILY PO) Take 1 capsule by mouth daily.     QUEtiapine  (SEROQUEL ) 25 MG tablet Take 1 tablet (25 mg total) by mouth at bedtime. 90 tablet 0   traZODone  (DESYREL ) 100 MG tablet Take one to two tablets at bedtime as needed. 180 tablet 1   No current facility-administered medications for this visit.    Medication  Side Effects: None  Allergies:  Allergies  Allergen Reactions   Esomeprazole Diarrhea and Other (See Comments)    Past Medical History:  Diagnosis Date   Depression    GERD (gastroesophageal reflux disease)    Hyperlipidemia    Hypertension     Family History  Problem Relation Age of Onset   Depression Mother    Cancer Father    Depression Brother     Social History   Socioeconomic History   Marital status: Single    Spouse name: Not on file   Number of children: 0   Years of education: Not on file   Highest education level: Doctorate  Occupational History   Occupation: Retired [employer] Employee  Tobacco Use   Smoking status: Former   Smokeless tobacco: Never  Advertising Account Planner   Vaping status: Never Used  Substance and Sexual Activity   Alcohol use: Not Currently   Drug use: Never   Sexual activity: Not on file  Other Topics Concern   Not on file  Social History Narrative   Not on file   Social Drivers of Health   Financial Resource Strain: Low Risk  (05/06/2023)   Overall Financial Resource Strain (CARDIA)    Difficulty of Paying Living Expenses: Not very hard  Food Insecurity: No Food Insecurity (05/06/2023)   Hunger Vital Sign    Worried About Running Out of Food in the Last Year: Never true    Ran Out of Food in the Last Year: Never true  Transportation Needs: Patient Declined (05/06/2023)   PRAPARE - Transportation    Lack of Transportation (Medical): Patient declined    Lack of Transportation (Non-Medical): Patient declined  Physical Activity: Unknown (05/06/2023)   Exercise Vital Sign    Days of Exercise per Week: 0 days    Minutes of Exercise per Session: Not on file  Stress: Stress Concern Present (05/06/2023)   [employer] of Occupational Health - Occupational Stress Questionnaire    Feeling of Stress : To some extent  Social Connections: Unknown (05/06/2023)   Social Connection and Isolation Panel    Frequency of Communication with Friends and  Family: Twice a week    Frequency of Social Gatherings with Friends and Family: Once a week    Attends Religious Services: Patient declined    Database Administrator or Organizations: Yes    Attends Engineer, Structural: More than 4 times per year    Marital Status: Never married  Intimate Partner Violence: Unknown (06/28/2021)   Received from [facility]   HITS    Physically Hurt: Not on file    Insult or Talk Down To: Not on file    Threaten Physical Harm: Not on file    Scream or Curse: Not on file    Past Medical History, Surgical history, Social history, and Family history were reviewed and updated as appropriate.   Please see review of systems for further details on the patient's review from today.   Objective:   Physical Exam:  There were no vitals taken for this visit.  Physical Exam Constitutional:      General: She is not in acute distress. Musculoskeletal:        General: No deformity.  Neurological:     Mental Status: She is alert and oriented to person, place, and time.     Coordination: Coordination normal.  Psychiatric:        Attention and Perception: Attention and perception normal. She does not perceive auditory or visual hallucinations.        Mood and Affect: Mood normal. Mood is not anxious or depressed. Affect is not labile, blunt, angry or inappropriate.        Speech: Speech normal.        Behavior: Behavior normal.        Thought Content: Thought content normal. Thought content is not paranoid or delusional. Thought content does not include homicidal or suicidal ideation. Thought content does not include homicidal or suicidal plan.        Cognition and Memory: Cognition and memory normal.        Judgment: Judgment normal.     Comments: Insight intact     Lab Review:     Component Value Date/Time   NA 140 05/12/2023 1619   K 3.8 05/12/2023 1619   CL 102 05/12/2023 1619   CO2 29 05/12/2023 1619   GLUCOSE 102 (H) 05/12/2023 1619   BUN  17 05/12/2023 1619   BUN 15 07/06/2019 0000   CREATININE 0.79 05/12/2023 1619   CALCIUM  9.6 05/12/2023 1619   PROT 6.1 05/12/2023 1619   ALBUMIN 4.0 08/13/2021 0905   AST 21 05/12/2023 1619   ALT 14 05/12/2023 1619   ALKPHOS 48 08/13/2021 0905   BILITOT 0.2 05/12/2023 1619   GFRNONAA >60 06/04/2021 1154       Component Value Date/Time   WBC 7.0 05/12/2023 1619   RBC 4.34 05/12/2023 1619   HGB 13.9 05/12/2023 1619   HCT 40.6 05/12/2023 1619   PLT 284 05/12/2023 1619   MCV 93.5 05/12/2023 1619   MCH 32.0 05/12/2023 1619   MCHC 34.2 05/12/2023 1619   RDW 12.3 05/12/2023 1619   LYMPHSABS 0.7 08/13/2021 0905   MONOABS 0.3 08/13/2021 0905   EOSABS 315 05/12/2023 1619   BASOSABS 49 05/12/2023 1619    No results found for: POCLITH, LITHIUM   No results found for: PHENYTOIN, PHENOBARB, VALPROATE, CBMZ   .res Assessment: Plan:    Plan:  Seeing [name] for therapy   PDMP reviewed  Continue:  Prozac  40mg  daily Trazadone 100mg  - take one to two tablets at bedtime for sleep Seroquel  25mg  - 1 at bedtime     Melatonin 5mg  at hs  Followed by [name] - therapy  Willing to consider ECT at [facility] but wants to wait until mid January. Will call and provide patient with more details as they are available. Will call [facility] for inpatient options for ECT.   RTC 4 weeks   15 minutes spent dedicated to the care of this patient on the date of this encounter to include pre-visit review of records, ordering of medication, post visit documentation, and face-to-face time with the patient discussing MDD, anxiety and insomnia. Discussed continuing current medication regimen.   Patient advised to contact office with any questions, adverse effects, or acute worsening in signs and symptoms.  Discussed potential metabolic side effects associated with atypical antipsychotics, as well as potential risk for movement side effects. Advised pt to contact  office if movement side  effects occur.   There are no diagnoses linked to this encounter.   Please see After Visit Summary for patient specific instructions.  Future Appointments  Date Time Provider Department Center  03/30/2024  3:00 PM Marijean [place], PhD CP-CP None    No orders of the defined types were placed in this encounter.     -------------------------------

## 2024-03-15 ENCOUNTER — Other Ambulatory Visit: Payer: Self-pay | Admitting: Adult Health

## 2024-03-15 DIAGNOSIS — F339 Major depressive disorder, recurrent, unspecified: Secondary | ICD-10-CM

## 2024-03-15 NOTE — Telephone Encounter (Signed)
 LM for pt to [facility], Pt states she is taking 2 pills of Seroquel . Want to confirm

## 2024-03-21 ENCOUNTER — Telehealth: Payer: Self-pay | Admitting: Adult Health

## 2024-03-21 MED ORDER — QUETIAPINE FUMARATE 50 MG PO TABS: 50.0000 mg | ORAL_TABLET | Freq: Every day | ORAL | 1 refills | Status: DC

## 2024-03-21 NOTE — Telephone Encounter (Signed)
 Sent Rx for 50 mg Seroquel .

## 2024-03-21 NOTE — Telephone Encounter (Signed)
 Pt seen 12/9  Seroquel  25mg  - 1 at bedtime   She said she is taking 2/day and wants new Rx. Is this ok to send?

## 2024-03-21 NOTE — Telephone Encounter (Signed)
 Pt said Seroquel  sig should say 2x a day   [employer] order

## 2024-03-23 ENCOUNTER — Telehealth: Payer: Self-pay | Admitting: Adult Health

## 2024-03-23 NOTE — Telephone Encounter (Signed)
 Print production planner has called [facility] re: ECT treatments. Was told by psych dept that the patient would need to present to the Emergency Dept at [facility] to be assessed for psychiatric admission.   Once admitted they can evaluate for ECT. [phone]

## 2024-03-29 ENCOUNTER — Telehealth (INDEPENDENT_AMBULATORY_CARE_PROVIDER_SITE_OTHER): Admitting: Adult Health

## 2024-03-29 ENCOUNTER — Encounter: Payer: Self-pay | Admitting: Adult Health

## 2024-03-29 DIAGNOSIS — G4700 Insomnia, unspecified: Secondary | ICD-10-CM

## 2024-03-29 DIAGNOSIS — F411 Generalized anxiety disorder: Secondary | ICD-10-CM | POA: Diagnosis not present

## 2024-03-29 DIAGNOSIS — F339 Major depressive disorder, recurrent, unspecified: Secondary | ICD-10-CM

## 2024-03-29 MED ORDER — FLUOXETINE HCL 40 MG PO CAPS: 40.0000 mg | ORAL_CAPSULE | Freq: Every day | ORAL | 0 refills | Status: AC

## 2024-03-29 NOTE — Progress Notes (Signed)
 Jamie [facility] [id number] 10/24/1945 79 y.o.  Virtual Visit via Video Note  I connected with pt @ on 03/29/2024 at 11:30 AM EST by a video enabled telemedicine application and verified that I am speaking with the correct person using two identifiers.   I discussed the limitations of evaluation and management by telemedicine and the availability of in person appointments. The patient expressed understanding and agreed to proceed.  I discussed the assessment and treatment plan with the patient. The patient was provided an opportunity to ask questions and all were answered. The patient agreed with the plan and demonstrated an understanding of the instructions.   The patient was advised to call back or seek an in-person evaluation if the symptoms worsen or if the condition fails to improve as anticipated.  I provided 15 minutes of non-face-to-face time during this encounter.  The patient was located at home.  The provider was located at [facility].   Jamie LOISE Sayers, NP   Subjective:   Patient ID:  [facility] is a 79 y.o. (DOB [birth year]) female.  Chief Complaint: No chief complaint on file.   HPI [facility] presents for follow-up of GAD, insomnia and TRD.  Seeing therapist - [facility]   Describes mood today as ok. Pleasant. Flat. Denies tearfulness. Mood symptoms - reports depression. Reports low interest and motivation. Reports anxiety - mostly about my health. Denies irritability. Denies panic attacks. Reports worry, rumination and over thinking - over my health. Reports she feels physically healthy, just not mentally. Reports mood as lower. Stating I feel like I'm doing about the same. Reports taking medications as prescribed. Reports she is considering another trial of ECT - would prefer to be [facility]. Energy levels lower. Active, does not have a regular exercise routine.  Unable to enjoy some usual interests and activities. Single. Lives alone. No family  local. Reports going out to eat once a week with friend - [facility] - it's good for me to do. Appetite adequate - reports eating 2 to 3 times a day - mostly frozen foods. Weight stable - 174 pounds. Reports sleeping 5 hours a night. Report using CPAP machine most nights. Denies daytime napping. Reports focus and concentration as it's so-so. Managing minimal aspects of household - a little bit, not a lot. Has a programmer, applications come by once every 6 weeks. Retired from [facility]. Denies SI or HI.   Denies AH or VH. Denies self harm. Denies substance use.   Previous ECT treatment Previous [facility]  treatmet  Previous medication trials: Trazodone , Hydroxyzine , Zoloft, Remeron, Seroquel , Vraylar , Abilify , Wellbutrin .   Review of Systems:  Review of Systems  Musculoskeletal:  Negative for gait problem.  Neurological:  Negative for tremors.  Gamble/Behavioral:         Please refer to HPI    Medications: I have reviewed the patient's current medications.  Current Outpatient Medications  Medication Sig Dispense Refill   atorvastatin  (LIPITOR) 40 MG tablet TAKE ONE TABLET BY MOUTH AT BEDTIME 90 tablet 1   FLUoxetine  (PROZAC ) 40 MG capsule Take 1 capsule (40 mg total) by mouth daily. 90 capsule 0   Glycerin-Hypromellose-PEG 400 (VISINE DRY EYE OP) Place 1 drop into both eyes daily as needed (dry eyes).     hydrochlorothiazide  (MICROZIDE ) 12.5 MG capsule Take 1 capsule (12.5 mg total) by mouth daily. 90 capsule 1   hydrOXYzine  (ATARAX ) 25 MG tablet Take 1 tablet (25 mg total) by mouth at bedtime. 90 tablet 0  losartan  (COZAAR ) 25 MG tablet TAKE ONE TABLET BY MOUTH DAILY 90 tablet 1   pantoprazole  (PROTONIX ) 40 MG tablet Take 1 tablet (40 mg total) by mouth daily. 90 tablet 1   Probiotic Product (PROBIOTIC DAILY PO) Take 1 capsule by mouth daily.     QUEtiapine  (SEROQUEL ) 50 MG tablet Take 1 tablet (50 mg total) by mouth at bedtime. 30 tablet 1   traZODone  (DESYREL ) 100 MG tablet Take one to two  tablets at bedtime as needed. 180 tablet 1   No current facility-administered medications for this visit.    Medication Side Effects: None  Allergies: Allergies[1]  Past Medical History:  Diagnosis Date   Depression    GERD (gastroesophageal reflux disease)    Hyperlipidemia    Hypertension     Family History  Problem Relation Age of Onset   Depression Mother    Cancer Father    Depression Brother     Social History   Socioeconomic History   Marital status: Single    Spouse name: Not on file   Number of children: 0   Years of education: Not on file   Highest education level: Doctorate  Occupational History   Occupation: Retired [employer] Employee  Tobacco Use   Smoking status: Former   Smokeless tobacco: Never  Advertising Account Planner   Vaping status: Never Used  Substance and Sexual Activity   Alcohol use: Not Currently   Drug use: Never   Sexual activity: Not on file  Other Topics Concern   Not on file  Social History Narrative   Not on file   Social Drivers of Health   Tobacco Use: Medium Risk (03/01/2024)   Patient History    Smoking Tobacco Use: Former    Smokeless Tobacco Use: Never    Passive Exposure: Not on Actuary Strain: Low Risk (05/06/2023)   Overall Financial Resource Strain (CARDIA)    Difficulty of Paying Living Expenses: Not very hard  Food Insecurity: No Food Insecurity (05/06/2023)   Hunger Vital Sign    Worried About Running Out of Food in the Last Year: Never true    Ran Out of Food in the Last Year: Never true  Transportation Needs: Patient Declined (05/06/2023)   PRAPARE - Transportation    Lack of Transportation (Medical): Patient declined    Lack of Transportation (Non-Medical): Patient declined  Physical Activity: Unknown (05/06/2023)   Exercise Vital Sign    Days of Exercise per Week: 0 days    Minutes of Exercise per Session: Not on file  Stress: Stress Concern Present (05/06/2023)   [employer] of Occupational Health  - Occupational Stress Questionnaire    Feeling of Stress : To some extent  Social Connections: Unknown (05/06/2023)   Social Connection and Isolation Panel    Frequency of Communication with Friends and Family: Twice a week    Frequency of Social Gatherings with Friends and Family: Once a week    Attends Religious Services: Patient declined    Database Administrator or Organizations: Yes    Attends Engineer, Structural: More than 4 times per year    Marital Status: Never married  Intimate Partner Violence: Unknown (06/28/2021)   Received from [facility]   HITS    Physically Hurt: Not on file    Insult or Talk Down To: Not on file    Threaten Physical Harm: Not on file    Scream or Curse: Not on file  Depression (PHQ2-9): High  Risk (01/27/2024)   Depression (PHQ2-9)    PHQ-2 Score: 18  Alcohol Screen: Not on file  Housing: Unknown (05/06/2023)   Housing Stability Vital Sign    Unable to Pay for Housing in the Last Year: No    Number of Times Moved in the Last Year: Not on file    Homeless in the Last Year: No  Utilities: Not At Risk (12/28/2022)   [facility] Utilities    Threatened with loss of utilities: No  Health Literacy: Not on file    Past Medical History, Surgical history, Social history, and Family history were reviewed and updated as appropriate.   Please see review of systems for further details on the patient's review from today.   Objective:   Physical Exam:  There were no vitals taken for this visit.  Physical Exam Constitutional:      General: She is not in acute distress. Musculoskeletal:        General: No deformity.  Neurological:     Mental Status: She is alert and oriented to person, place, and time.     Coordination: Coordination normal.  Gamble:        Attention and Perception: Attention and perception normal. She does not perceive auditory or visual hallucinations.        Mood and Affect: Mood normal. Mood is not anxious or depressed. Affect  is not labile, blunt, angry or inappropriate.        Speech: Speech normal.        Behavior: Behavior normal.        Thought Content: Thought content normal. Thought content is not paranoid or delusional. Thought content does not include homicidal or suicidal ideation. Thought content does not include homicidal or suicidal plan.        Cognition and Memory: Cognition and memory normal.        Judgment: Judgment normal.     Comments: Insight intact     Lab Review:     Component Value Date/Time   NA 140 05/12/2023 1619   K 3.8 05/12/2023 1619   CL 102 05/12/2023 1619   CO2 29 05/12/2023 1619   GLUCOSE 102 (H) 05/12/2023 1619   BUN 17 05/12/2023 1619   BUN 15 07/06/2019 0000   CREATININE 0.79 05/12/2023 1619   CALCIUM  9.6 05/12/2023 1619   PROT 6.1 05/12/2023 1619   ALBUMIN 4.0 08/13/2021 0905   AST 21 05/12/2023 1619   ALT 14 05/12/2023 1619   ALKPHOS 48 08/13/2021 0905   BILITOT 0.2 05/12/2023 1619   GFRNONAA >60 06/04/2021 1154       Component Value Date/Time   WBC 7.0 05/12/2023 1619   RBC 4.34 05/12/2023 1619   HGB 13.9 05/12/2023 1619   HCT 40.6 05/12/2023 1619   PLT 284 05/12/2023 1619   MCV 93.5 05/12/2023 1619   MCH 32.0 05/12/2023 1619   MCHC 34.2 05/12/2023 1619   RDW 12.3 05/12/2023 1619   LYMPHSABS 0.7 08/13/2021 0905   MONOABS 0.3 08/13/2021 0905   EOSABS 315 05/12/2023 1619   BASOSABS 49 05/12/2023 1619    No results found for: POCLITH, LITHIUM   No results found for: PHENYTOIN, PHENOBARB, VALPROATE, CBMZ   .res Assessment: Plan:    Plan:  Seeing [name] for therapy   PDMP reviewed  Continue:  Prozac  40mg  daily Trazadone 100mg  - take one to two tablets at bedtime for sleep Seroquel  50mg  - one at bedtime     Melatonin 5mg  at hs  Followed  by [name] - therapy  Discussed process for consideration at [facility] for ECT program - patient would like to think about it. She would need to been seen in the ED at [facility] for  evaluation.   RTC 4 weeks   15 minutes spent dedicated to the care of this patient on the date of this encounter to include pre-visit review of records, ordering of medication, post visit documentation, and face-to-face time with the patient discussing MDD, anxiety and insomnia. Discussed continuing current medication regimen.   Patient advised to contact office with any questions, adverse effects, or acute worsening in signs and symptoms.  Discussed potential metabolic side effects associated with atypical antipsychotics, as well as potential risk for movement side effects. Advised pt to contact office if movement side effects occur.   There are no diagnoses linked to this encounter.   Please see After Visit Summary for patient specific instructions.  Future Appointments  Date Time Provider Department [facility]  03/30/2024  3:00 PM Kendall Charleston, PhD [facility] None    No orders of the defined types were placed in this encounter.     -------------------------------     [1]  Allergies Allergen Reactions   Esomeprazole Diarrhea and Other (See Comments)

## 2024-03-30 ENCOUNTER — Ambulatory Visit: Admitting: Psychiatry

## 2024-03-30 DIAGNOSIS — F411 Generalized anxiety disorder: Secondary | ICD-10-CM

## 2024-03-30 DIAGNOSIS — F339 Major depressive disorder, recurrent, unspecified: Secondary | ICD-10-CM | POA: Diagnosis not present

## 2024-03-30 DIAGNOSIS — Z9189 Other specified personal risk factors, not elsewhere classified: Secondary | ICD-10-CM

## 2024-03-30 DIAGNOSIS — R69 Illness, unspecified: Secondary | ICD-10-CM | POA: Diagnosis not present

## 2024-03-30 DIAGNOSIS — F40232 Fear of other medical care: Secondary | ICD-10-CM | POA: Diagnosis not present

## 2024-03-30 NOTE — Progress Notes (Signed)
 Psychotherapy Progress Note [facility] [name], PhD LP  Patient ID: Jamie Gamble Jamie Gamble)    MRN: [id number] Therapy format: Individual psychotherapy Date: 03/30/2024      Start: 3:22p     Stop: 3:41p     Time Spent: 29 min Location: Telehealth visit -- I connected with this patient by an approved telecommunication method (video), with her informed consent, and verifying identity and patient privacy.  I was located at my office and patient at her home.  As needed, we discussed the limitations, risks, and security and privacy concerns associated with telehealth service, including the availability and conditions which currently govern in-person appointments and the possibility that 3rd-party payment may not be fully guaranteed and she may be responsible for charges.  After she indicated understanding, we proceeded with the session.  Also discussed treatment planning, as needed, including ongoing verbal agreement with the plan, the opportunity to ask and answer all questions, her demonstrated understanding of instructions, and her readiness to call the office should symptoms worsen or she feels she is in a crisis state and needs more immediate and tangible assistance.   Session narrative (presenting needs, interim history, self-report of stressors and symptoms, applications of prior therapy, status changes, and interventions made in session) About the same, shut in most of the time, out to eat with Arlean 1/wk, including through holidays.  No ideas or inclinations for adding to her lifestyle or shaking things up a bit.  No inclinations what would tip the balance between tired of depression and fear of social and anxiety exposure going to ECT.  As reported yesterday to Dr. Mozingo, her mindset is still I'll think about it and leaving well enough alone in medication, etc.    Lifestyle remains stable, mostly shut in by choice.  Filling the time being at home.  No prodding from [place].  No  compelling interest in rejoining [employer] or seeking another gathering or activity outside of the house.  Not losing weight, adequately fed, no medical complaints.  Queried whether she sees herself stiffening up physically -- doesn't notice it, though last face to face encounter she was observably stiffer, as if knees, hips, and back have become less flexible, or there is joint pain to contend with.  Queried whether it feels on any level like stable depression might serve as a justification for avoiding some scenarios in the world she doesn't want to be in -- for instance, how she came to bow out of the [employer] class for feeling the people were pretentious, or maybe some unacknowledged social anxiety, no not aware.  Only foggy memory for having felt like withdrawing from Sunday School.    Seeing no utility in further prodding and the significant risk of annoying her to no benefit, agreed to call the session short.  Confirmed impression that she is comfortably uncomfortable with the situation she is in, neither committable nor truly vegetative, just decidedly not ready to shake up the routine she knows, either through ECT or by contracting for activities, time out of the house beyond the established pattern with Arlean, or increased physical activity.  These are all recommended for her depression and anxiety.  Agreed to leave it to her to initiate scheduling, since no further appointments are planned, and it comes down to her own willingness to do something different than habit.  Psychiatry apprised after session.  Therapeutic modalities: Cognitive Behavioral Therapy, Solution-Oriented/Positive Psychology, Ego-Supportive, and Motivational Interviewing  Mental Status/Observations:  Appearance:  Casual     Behavior:  Rigid  Motor:  Normal  Speech/Language:   Clear and Coherent and less initiated  Affect:  Constricted  Mood:  depressed  Thought process:  normal  Thought content:    WNL   Sensory/Perceptual disturbances:    WNL  Orientation:  Fully oriented  Attention:  Good    Concentration:  Good  Memory:  grossly intact  Insight:    Fair  Judgment:   Fair  Impulse Control:  Good   Risk Assessment: Danger to Self: No Self-injurious Behavior: No Danger to Others: No Physical Aggression / Violence: No Duty to Warn: No Access to Firearms a concern: No  Assessment of progress:  stabilized  Diagnosis:   ICD-10-CM   1. Recurrent major depression resistant to treatment  F33.9     2. Generalized anxiety disorder -- severe, with sensory hypersensitivity  F41.1     3. Fear of other medical care, particularly ECT  F40.232     4. At increased risk for social isolation  Z91.89     5. social anxiety -- r/o byproduct of depression vs. SAD vs. Avoidant Personality  R69      Plan:  Amended terms of service -- Agree to slow down therapy contact at this point, given the absence of urgency or motivation to change.  Consent for communication with friend Arlean is now permission to receive info, check with Pt before disclosure.  Other aspects of plan below apply as ongoing recommendations and inventory of important feedback to date. Antidepression priorities --  Continue to consider ECT available as a proven method to re-motivate her and break obsessions, insofar as she needs.  Defer to Pt's discretion if needed, or to her ability to collect herself if it is too scary a prospect. Reframe trying and worrying -- Notice things she does that she labels as for distraction and reframe them as just ways of spending time, with no reference to what she is trying not to think about.  Practice letting it just be another piece of time, making do rather than fighting internal states, which are inherently viewed as foreign and overwhelming. Find variety in shut in experience -- If reachable, vary TV viewing, seek out things that are either beautiful or learn something (e.g., an  [place] nature documentary), de-emphasize judgment whether it's working and just see about taking it in, succeeding in variety. Request that some socializing with [name] be done inside, for the sake of changing how her home feels Make a daily practice of seeing some sunlight in the morning Try to get out for something, not just have it all delivered Try to allow a neighbor to interact briefly, and say so if she'd like to get back home, trust it's not rude but honest Notice and dispute I have to, but I can't thinking while wrestling with tasks -- either determine I don't have to after all, or I actually can do a little something Prayer tone -- Asking God to take away depression is honest, but asking help coping, strengthening, and noticing the good among the bad is more constructive Notice any difference these efforts make, however small Behavioral/social activation -- advise be in touch with [name], try to contact [employer] friends, and entertain asking someone else to come over or get out with them somehow; vary spending social time out in the world and within her home, so home does not become so conditioned to retreat and perceived safety from social anxiety. Sleep regulation --  Should continue orange glasses QPM to facilitate circadian signaling, sleep readiness, and sleep hardiness.  Best recommendation dosing sleep-assisting meds up to 1 hr before bed to help her move through initial insomnia and insomnia anxiety.  Option add melatonin 2-5mg  if needed 1-2 hr ahead of bed.  Practice sleep hygiene, including comfortable arrangements, reduced lighting, and all caffeine stopped well ahead of bedtime (at least 6 hrs).  Use soothing imagery PRN for DFA.  Should adhere to CPAP and ensure proper maintenance.  For worry control, option to externalize worry thoughts by writing them down and then consciously decide whether to stay up and think -- out of bed -- or park them and sleep, with further  option to offer them to God's care overnight, consistent with her faith.  Acknowledge and table any worry about whether she will sleep -- trust the natural sleep drive to come around, and if too wakeful, get out of bed for anything not too stimulating.  Trust variations in sleep experience, e.g., an uptick in dreams, as natural progression of catching up deficits and processing emotional life issues.  Medication options may include alpha blocker to turn down the volume on autonomic arousal, signal it safer to sleep.   Physiological mood factors -- Start the day with good lighting, movement/stretching, and a few minutes grounding, outdoors if available/desired.  Vitamin levels are normal, but improved B12 and D could still be helpful.  Should get some moderate exercise as able, even just walking and stretching, to combat depressogenic inactivity effects.  Continue supplements -- NAC regularly in the morning, Mg L-theonate regularly in the evening. Self-esteem and intrusive guilt/shame thoughts -- PRN practices of blessing-counting and listing ways people have told/shown her she is lovable/capable.  No requirement to believe or agree, just consider.  Seek to notice and give self credit when she does anything constructive or principled for things that matter to her wellbeing or sense of purpose and for any efforts that are better than full avoidance.  As needed, self-affirm God would not be punishing her, it's a trick of depression to think it. Activities and socialization -- Continue to strongly recommend at least minimal outings to break rumination and stimulate her emotional and behavioral systems.  As able, develop activities of interest.  Self-monitor for broad demotivation and reach out.  Known options in friend [name] [place], [place], and church activities at 3 locations, ranging from chair yoga to entertainment/fellowship group to more theological and philosophical study.  Maintain/restore system of  friends beyond [place], which may include anyone available at housing community.  Dispute worry thoughts of being judged, and enter interpersonal space with permission to feel not so up to it, either.  [employer] can be available for supportive calls, assistance services, and possible facility-based recreation.  Home care companions when needed.  Self-monitor for impulse to more fully isolate, and in all involvements practice permission to be authentic about her tolerance for spending time in view or the presence of others.  OK with employment if regains interest. Placement concerns -- Independent living with PRN assistance should be fine for the foreseeable future, though she could use the interpersonal stimulus of more regular contact inside or outside the confines of her home.  Available on request and consent to interpret her condition empowered persons among her family and friends.  May consider congregate living if it proves too difficult to motivate to ECT or to pull together and reclaim her lifestyle. Specialized therapies and medical procedure anxiety -- ECT  recommended based on successful experience, but resisted.  Despite cognitive recovery after ECT, fear of medical procedures and fragility in body or mind are persistent and interfere with acceptance and decision-making; need to adopt willingness to take some kind of risk.  [place]  tried and unable to abide due to anxiety about doing it right and having dissociative chemistry working on her.  TMS broached and rejected.  Where reckoning with fear, urged to ask all necessary questions of health care, make sure to adequately comprehend recommendations rather than catastrophize without knowing.  For any procedure accepted, imagine the competence and caring of health care personnel in action, especially if it involves anesthesia and other vulnerable-making procedures. Weight management -- Concur with modified Weight Watchers approach and improved food  choices as motivated.  Encourage to stay mobile, watch portions, limit carbs, try the practice of leaving a bite, and phase in foods from the MIND Diet. Financial concerns -- As needed, follow through fact-finding and meeting with an advisor, in coordination with friend/POA [name].  Still OK to seek part-time work she can reach from her home if interested.  Maintain cordial working relationship with nephew/trustee Bonnie. Safety & welfare plan -- Continue pledge of no harm, see one or the other mental health provider at least 2/month until returning to normal activity outside the home.  Alyse [name] remains POA and cleared to confer in emergencies but not yet for planning interventions on her behalf.  Recommend all treatment team members stand willing to contact [name] if any indications of compromised safety, or poor comprehension or judgment. Coordination -- Regular conference with psychiatry Other recommendations/advice -- As may be noted above.  Continue to utilize previously learned skills ad lib. Medication compliance -- Maintain medication as prescribed and work faithfully with relevant prescriber(s) if any changes are desired or seem indicated. Crisis service -- Aware of call list and work-in appts.  Call the clinic on-call service, 988/hotline, 911, or present to [facility] or ER if any life-threatening psychiatric crisis. Followup -- Return for will call.  Next scheduled visit with me Visit date not found.  Next scheduled in this office 04/29/2024.  Lamar Kendall, PhD [name], PhD LP Clinical Psychologist, [facility] [address], [address] [place], [place] [address] [phone]

## 2024-04-08 ENCOUNTER — Other Ambulatory Visit: Payer: Self-pay | Admitting: Adult Health

## 2024-04-08 DIAGNOSIS — G4700 Insomnia, unspecified: Secondary | ICD-10-CM

## 2024-04-08 NOTE — Telephone Encounter (Signed)
 Pt called and LVM @ 1:31p requesting refill of Hydroxyzine  to  [facility] [place], [place] - [address] [facility] [address] [place] [address] Phone: [phone]  Fax: [phone]   Next appt 2/6

## 2024-04-29 ENCOUNTER — Telehealth: Admitting: Adult Health

## 2024-04-29 ENCOUNTER — Encounter: Payer: Self-pay | Admitting: Adult Health

## 2024-04-29 DIAGNOSIS — F339 Major depressive disorder, recurrent, unspecified: Secondary | ICD-10-CM

## 2024-04-29 DIAGNOSIS — F411 Generalized anxiety disorder: Secondary | ICD-10-CM

## 2024-04-29 DIAGNOSIS — G4700 Insomnia, unspecified: Secondary | ICD-10-CM

## 2024-04-29 MED ORDER — QUETIAPINE FUMARATE 50 MG PO TABS: 50.0000 mg | ORAL_TABLET | Freq: Every day | ORAL | 2 refills | Status: AC

## 2024-04-29 NOTE — Progress Notes (Signed)
 Jamie Gamble [id number] [birth year] 79 y.o.  Virtual Visit via Video Note  I connected with pt @ on 04/29/24 at 11:30 AM EST by a video enabled telemedicine application and verified that I am speaking with the correct person using two identifiers.   I discussed the limitations of evaluation and management by telemedicine and the availability of in person appointments. The patient expressed understanding and agreed to proceed.  I discussed the assessment and treatment plan with the patient. The patient was provided an opportunity to ask questions and all were answered. The patient agreed with the plan and demonstrated an understanding of the instructions.   The patient was advised to call back or seek an in-person evaluation if the symptoms worsen or if the condition fails to improve as anticipated.  I provided 15 minutes of non-face-to-face time during this encounter.  The patient was located at home.  The provider was located at [facility].   Angeline LOISE Sayers, NP   Subjective:   Patient ID:  Jamie Gamble is a 79 y.o. (DOB 1945/09/13) female.  Chief Complaint: No chief complaint on file.   HPI Jamie PIGGOTT presents for follow-up of GAD, insomnia and TRD.  Seeing therapist - Dr. Marijean   Describes mood today as ok. Pleasant. Flat. Denies tearfulness. Mood symptoms - reports depression. Reports low interest and motivation. Reports anxiety - about my health. Denies irritability. Denies panic attacks. Reports some worry, rumination and over thinking - the health stuff. Reports she feels feels physically ok, just not mentally. Reports mood as lower. Stating I'm not feeling any better. Reports taking medications as prescribed.  Energy levels lower. Active, does not have a regular exercise routine.  Unable to enjoy some usual interests and activities. Single. Lives alone. No family local. Reports going out to eat once a week with her friend - [name]. Appetite adequate.  Weight stable - 174 pounds. Reports sleeping 5 hours a night. Report using CPAP machine - most of the time. Denies daytime napping. Reports focus and concentration as fair. Managing minimal aspects of household - I'm doing a little bit. Has a programmer, applications come by once every 6 to 8 weeks. Retired from [employer]. Denies SI or HI.   Denies AH or VH. Denies self harm. Denies substance use.   Previous ECT treatment Previous [facility]  treatmet  Previous medication trials: Trazodone , Hydroxyzine , Zoloft, Remeron, Seroquel , Vraylar , Abilify , Wellbutrin .    Review of Systems:  Review of Systems  Musculoskeletal:  Negative for gait problem.  Neurological:  Negative for tremors.  Psychiatric/Behavioral:         Please refer to HPI    Medications: I have reviewed the patient's current medications.  Current Outpatient Medications  Medication Sig Dispense Refill   atorvastatin  (LIPITOR) 40 MG tablet TAKE ONE TABLET BY MOUTH AT BEDTIME 90 tablet 1   FLUoxetine  (PROZAC ) 40 MG capsule Take 1 capsule (40 mg total) by mouth daily. 90 capsule 0   Glycerin-Hypromellose-PEG 400 (VISINE DRY EYE OP) Place 1 drop into both eyes daily as needed (dry eyes).     hydrochlorothiazide  (MICROZIDE ) 12.5 MG capsule Take 1 capsule (12.5 mg total) by mouth daily. 90 capsule 1   hydrOXYzine  (ATARAX ) 25 MG tablet Take 1 tablet (25 mg total) by mouth at bedtime. 90 tablet 0   losartan  (COZAAR ) 25 MG tablet TAKE ONE TABLET BY MOUTH DAILY 90 tablet 1   pantoprazole  (PROTONIX ) 40 MG tablet Take 1 tablet (40 mg total) by mouth daily.  90 tablet 1   Probiotic Product (PROBIOTIC DAILY PO) Take 1 capsule by mouth daily.     QUEtiapine  (SEROQUEL ) 50 MG tablet Take 1 tablet (50 mg total) by mouth at bedtime. 30 tablet 1   traZODone  (DESYREL ) 100 MG tablet Take one to two tablets at bedtime as needed. 180 tablet 1   No current facility-administered medications for this visit.    Medication Side Effects: None  Allergies:  Allergies[1]  Past Medical History:  Diagnosis Date   Depression    GERD (gastroesophageal reflux disease)    Hyperlipidemia    Hypertension     Family History  Problem Relation Age of Onset   Depression Mother    Cancer Father    Depression Brother     Social History   Socioeconomic History   Marital status: Single    Spouse name: Not on file   Number of children: 0   Years of education: Not on file   Highest education level: Doctorate  Occupational History   Occupation: Retired [employer] Employee  Tobacco Use   Smoking status: Former   Smokeless tobacco: Never  Advertising Account Planner   Vaping status: Never Used  Substance and Sexual Activity   Alcohol use: Not Currently   Drug use: Never   Sexual activity: Not on file  Other Topics Concern   Not on file  Social History Narrative   Not on file   Social Drivers of Health   Tobacco Use: Medium Risk (03/29/2024)   Patient History    Smoking Tobacco Use: Former    Smokeless Tobacco Use: Never    Passive Exposure: Not on Actuary Strain: Low Risk (05/06/2023)   Overall Financial Resource Strain (CARDIA)    Difficulty of Paying Living Expenses: Not very hard  Food Insecurity: No Food Insecurity (05/06/2023)   Hunger Vital Sign    Worried About Running Out of Food in the Last Year: Never true    Ran Out of Food in the Last Year: Never true  Transportation Needs: Patient Declined (05/06/2023)   PRAPARE - Transportation    Lack of Transportation (Medical): Patient declined    Lack of Transportation (Non-Medical): Patient declined  Physical Activity: Unknown (05/06/2023)   Exercise Vital Sign    Days of Exercise per Week: 0 days    Minutes of Exercise per Session: Not on file  Stress: Stress Concern Present (05/06/2023)   [employer] of Occupational Health - Occupational Stress Questionnaire    Feeling of Stress : To some extent  Social Connections: Unknown (05/06/2023)   Social Connection and Isolation Panel     Frequency of Communication with Friends and Family: Twice a week    Frequency of Social Gatherings with Friends and Family: Once a week    Attends Religious Services: Patient declined    Database Administrator or Organizations: Yes    Attends Banker Meetings: More than 4 times per year    Marital Status: Never married  Intimate Partner Violence: Unknown (06/28/2021)   Received from [facility]   HITS    Physically Hurt: Not on file    Insult or Talk Down To: Not on file    Threaten Physical Harm: Not on file    Scream or Curse: Not on file  Depression (PHQ2-9): High Risk (01/27/2024)   Depression (PHQ2-9)    PHQ-2 Score: 18  Alcohol Screen: Not on file  Housing: Unknown (05/06/2023)   Housing Stability Vital Sign  Unable to Pay for Housing in the Last Year: No    Number of Times Moved in the Last Year: Not on file    Homeless in the Last Year: No  Utilities: Not At Risk (12/28/2022)   [facility] Utilities    Threatened with loss of utilities: No  Health Literacy: Not on file    Past Medical History, Surgical history, Social history, and Family history were reviewed and updated as appropriate.   Please see review of systems for further details on the patient's review from today.   Objective:   Physical Exam:  There were no vitals taken for this visit.  Physical Exam Constitutional:      General: She is not in acute distress. Musculoskeletal:        General: No deformity.  Neurological:     Mental Status: She is alert and oriented to person, place, and time.     Coordination: Coordination normal.  Psychiatric:        Attention and Perception: Attention and perception normal. She does not perceive auditory or visual hallucinations.        Mood and Affect: Mood normal. Mood is not anxious or depressed. Affect is not labile, blunt, angry or inappropriate.        Speech: Speech normal.        Behavior: Behavior normal.        Thought Content: Thought content  normal. Thought content is not paranoid or delusional. Thought content does not include homicidal or suicidal ideation. Thought content does not include homicidal or suicidal plan.        Cognition and Memory: Cognition and memory normal.        Judgment: Judgment normal.     Comments: Insight intact     Lab Review:     Component Value Date/Time   NA 140 05/12/2023 1619   K 3.8 05/12/2023 1619   CL 102 05/12/2023 1619   CO2 29 05/12/2023 1619   GLUCOSE 102 (H) 05/12/2023 1619   BUN 17 05/12/2023 1619   BUN 15 07/06/2019 0000   CREATININE 0.79 05/12/2023 1619   CALCIUM  9.6 05/12/2023 1619   PROT 6.1 05/12/2023 1619   ALBUMIN 4.0 08/13/2021 0905   AST 21 05/12/2023 1619   ALT 14 05/12/2023 1619   ALKPHOS 48 08/13/2021 0905   BILITOT 0.2 05/12/2023 1619   GFRNONAA >60 06/04/2021 1154       Component Value Date/Time   WBC 7.0 05/12/2023 1619   RBC 4.34 05/12/2023 1619   HGB 13.9 05/12/2023 1619   HCT 40.6 05/12/2023 1619   PLT 284 05/12/2023 1619   MCV 93.5 05/12/2023 1619   MCH 32.0 05/12/2023 1619   MCHC 34.2 05/12/2023 1619   RDW 12.3 05/12/2023 1619   LYMPHSABS 0.7 08/13/2021 0905   MONOABS 0.3 08/13/2021 0905   EOSABS 315 05/12/2023 1619   BASOSABS 49 05/12/2023 1619    No results found for: POCLITH, LITHIUM   No results found for: PHENYTOIN, PHENOBARB, VALPROATE, CBMZ   .res Assessment: Plan:    Plan:  Seeing [name] for therapy   PDMP reviewed  Continue:  Prozac  40mg  daily Trazadone 100mg  - take one to two tablets at bedtime for sleep Seroquel  50mg  - one at bedtime   Hydroxyzine  25mg  at bedtime  Melatonin 5mg  at hs  Followed by [name] - therapy  Discussed process for consideration at [facility] for [facility] program - patient would like to think about it. She would need to been  seen in the ED at [facility] for evaluation.   RTC 4 weeks   15 minutes spent dedicated to the care of this patient on the date of this encounter to include  pre-visit review of records, ordering of medication, post visit documentation, and face-to-face time with the patient discussing MDD, anxiety and insomnia. Discussed continuing current medication regimen.   Patient advised to contact office with any questions, adverse effects, or acute worsening in signs and symptoms.  Discussed potential metabolic side effects associated with atypical antipsychotics, as well as potential risk for movement side effects. Advised pt to contact office if movement side effects occur.   There are no diagnoses linked to this encounter.   Please see After Visit Summary for patient specific instructions.  Future Appointments  Date Time Provider Department Center  04/29/2024 11:30 AM Ruffus Kamaka Nattalie, NP [facility] None  05/13/2024  3:00 PM Mitchum, Lamar, PhD [facility] None    No orders of the defined types were placed in this encounter.     -------------------------------     [1]  Allergies Allergen Reactions   Esomeprazole Diarrhea and Other (See Comments)

## 2024-05-13 ENCOUNTER — Ambulatory Visit: Admitting: Psychiatry

## 2024-05-27 ENCOUNTER — Telehealth: Admitting: Adult Health
